# Patient Record
Sex: Female | Born: 1940
Health system: Southern US, Community
[De-identification: ages and names within clinical notes are randomized; demographics above are authoritative.]

## PROBLEM LIST (undated history)

## (undated) DIAGNOSIS — E785 Hyperlipidemia, unspecified: Secondary | ICD-10-CM

## (undated) DIAGNOSIS — M8580 Other specified disorders of bone density and structure, unspecified site: Secondary | ICD-10-CM

## (undated) DIAGNOSIS — L409 Psoriasis, unspecified: Secondary | ICD-10-CM

## (undated) DIAGNOSIS — Z862 Personal history of diseases of the blood and blood-forming organs and certain disorders involving the immune mechanism: Secondary | ICD-10-CM

## (undated) DIAGNOSIS — K589 Irritable bowel syndrome without diarrhea: Secondary | ICD-10-CM

## (undated) HISTORY — PX: ABDOMINAL HYSTERECTOMY: SHX81

## (undated) HISTORY — PX: BREAST SURGERY: SHX581

## (undated) HISTORY — DX: Personal history of diseases of the blood and blood-forming organs and certain disorders involving the immune mechanism: Z86.2

## (undated) HISTORY — DX: Other specified disorders of bone density and structure, unspecified site: M85.80

## (undated) HISTORY — DX: Psoriasis, unspecified: L40.9

## (undated) HISTORY — PX: TUBAL LIGATION: SHX77

## (undated) HISTORY — PX: APPENDECTOMY: SHX54

## (undated) HISTORY — PX: EYE SURGERY: SHX253

## (undated) HISTORY — DX: Irritable bowel syndrome, unspecified: K58.9

## (undated) HISTORY — DX: Hyperlipidemia, unspecified: E78.5

---

## 1998-05-08 ENCOUNTER — Ambulatory Visit (HOSPITAL_COMMUNITY): Admission: RE | Admit: 1998-05-08 | Discharge: 1998-05-08 | Payer: Self-pay | Admitting: Obstetrics and Gynecology

## 1998-07-13 ENCOUNTER — Ambulatory Visit (HOSPITAL_COMMUNITY): Admission: RE | Admit: 1998-07-13 | Discharge: 1998-07-13 | Payer: Self-pay | Admitting: Orthopedic Surgery

## 1998-07-17 ENCOUNTER — Ambulatory Visit (HOSPITAL_BASED_OUTPATIENT_CLINIC_OR_DEPARTMENT_OTHER): Admission: RE | Admit: 1998-07-17 | Discharge: 1998-07-17 | Payer: Self-pay | Admitting: Orthopedic Surgery

## 1998-07-23 ENCOUNTER — Other Ambulatory Visit: Admission: RE | Admit: 1998-07-23 | Discharge: 1998-07-23 | Payer: Self-pay | Admitting: Obstetrics and Gynecology

## 1999-07-01 ENCOUNTER — Encounter: Payer: Self-pay | Admitting: Obstetrics and Gynecology

## 1999-07-01 ENCOUNTER — Ambulatory Visit (HOSPITAL_COMMUNITY): Admission: RE | Admit: 1999-07-01 | Discharge: 1999-07-01 | Payer: Self-pay | Admitting: Obstetrics and Gynecology

## 1999-08-23 ENCOUNTER — Other Ambulatory Visit: Admission: RE | Admit: 1999-08-23 | Discharge: 1999-08-23 | Payer: Self-pay | Admitting: Obstetrics and Gynecology

## 2000-07-20 ENCOUNTER — Ambulatory Visit (HOSPITAL_COMMUNITY): Admission: RE | Admit: 2000-07-20 | Discharge: 2000-07-20 | Payer: Self-pay | Admitting: Obstetrics and Gynecology

## 2000-07-20 ENCOUNTER — Encounter: Payer: Self-pay | Admitting: Obstetrics and Gynecology

## 2001-07-22 ENCOUNTER — Encounter: Payer: Self-pay | Admitting: Obstetrics and Gynecology

## 2001-07-22 ENCOUNTER — Ambulatory Visit (HOSPITAL_COMMUNITY): Admission: RE | Admit: 2001-07-22 | Discharge: 2001-07-22 | Payer: Self-pay | Admitting: Obstetrics and Gynecology

## 2001-08-31 ENCOUNTER — Other Ambulatory Visit: Admission: RE | Admit: 2001-08-31 | Discharge: 2001-08-31 | Payer: Self-pay | Admitting: Obstetrics and Gynecology

## 2002-05-25 ENCOUNTER — Encounter: Payer: Self-pay | Admitting: Family Medicine

## 2002-05-25 ENCOUNTER — Encounter: Admission: RE | Admit: 2002-05-25 | Discharge: 2002-05-25 | Payer: Self-pay | Admitting: Family Medicine

## 2002-08-24 ENCOUNTER — Ambulatory Visit (HOSPITAL_COMMUNITY): Admission: RE | Admit: 2002-08-24 | Discharge: 2002-08-24 | Payer: Self-pay | Admitting: Obstetrics and Gynecology

## 2002-08-24 ENCOUNTER — Encounter: Payer: Self-pay | Admitting: Obstetrics and Gynecology

## 2003-08-28 ENCOUNTER — Ambulatory Visit (HOSPITAL_COMMUNITY): Admission: RE | Admit: 2003-08-28 | Discharge: 2003-08-28 | Payer: Self-pay | Admitting: Obstetrics and Gynecology

## 2004-09-03 ENCOUNTER — Ambulatory Visit (HOSPITAL_COMMUNITY): Admission: RE | Admit: 2004-09-03 | Discharge: 2004-09-03 | Payer: Self-pay | Admitting: Obstetrics and Gynecology

## 2005-09-08 ENCOUNTER — Ambulatory Visit (HOSPITAL_COMMUNITY): Admission: RE | Admit: 2005-09-08 | Discharge: 2005-09-08 | Payer: Self-pay | Admitting: Obstetrics and Gynecology

## 2006-04-28 ENCOUNTER — Ambulatory Visit (HOSPITAL_COMMUNITY): Admission: RE | Admit: 2006-04-28 | Discharge: 2006-04-29 | Payer: Self-pay | Admitting: Internal Medicine

## 2006-04-28 ENCOUNTER — Encounter (INDEPENDENT_AMBULATORY_CARE_PROVIDER_SITE_OTHER): Payer: Self-pay | Admitting: Specialist

## 2006-09-10 ENCOUNTER — Ambulatory Visit (HOSPITAL_COMMUNITY): Admission: RE | Admit: 2006-09-10 | Discharge: 2006-09-10 | Payer: Self-pay | Admitting: Obstetrics and Gynecology

## 2007-09-14 ENCOUNTER — Ambulatory Visit (HOSPITAL_COMMUNITY): Admission: RE | Admit: 2007-09-14 | Discharge: 2007-09-14 | Payer: Self-pay | Admitting: Obstetrics and Gynecology

## 2008-09-14 ENCOUNTER — Ambulatory Visit (HOSPITAL_COMMUNITY): Admission: RE | Admit: 2008-09-14 | Discharge: 2008-09-14 | Payer: Self-pay | Admitting: Obstetrics and Gynecology

## 2009-09-18 ENCOUNTER — Ambulatory Visit (HOSPITAL_COMMUNITY): Admission: RE | Admit: 2009-09-18 | Discharge: 2009-09-18 | Payer: Self-pay | Admitting: Obstetrics and Gynecology

## 2010-08-19 ENCOUNTER — Other Ambulatory Visit (HOSPITAL_COMMUNITY): Payer: Self-pay | Admitting: Internal Medicine

## 2010-08-19 DIAGNOSIS — Z1231 Encounter for screening mammogram for malignant neoplasm of breast: Secondary | ICD-10-CM

## 2010-09-06 NOTE — H&P (Signed)
NAME:  Madison Gonzalez, Madison Gonzalez NO.:  [id number]   MEDICAL RECORD NO.:  [id number]          PATIENT TYPE:  AMB   LOCATION:  SDC                           FACILITY:  [facility]   PHYSICIAN:  Malachi Pro. Ambrose Mantle, M.D. DATE OF BIRTH:  [birth year]   DATE OF ADMISSION:  04/28/2006  DATE OF DISCHARGE:                              HISTORY & PHYSICAL   HISTORY OF PRESENT ILLNESS:  This is a 70 year old white widowed female,  para 3-0-0-3, who is admitted for vaginal hysterectomy, A&P repair  because of fourth degree cystocele, fourth degree cervical prolapse and  smaller rectocele.  The patient's last menstrual period was 9.  She  has had a prolapse of the cervix and the bladder for quite a few years.  It has not bothered her significantly until fairly recently.  She did  undergo a urologic evaluation by Dr. Vernie Ammons who felt like she did not  need a sling procedure.  She is admitted now for vaginal hysterectomy  and A&P repair.  She has minimal stress urinary incontinence but does  feel the protrusion.   PAST MEDICAL HISTORY:  Reveals no known drug allergies.  No latex  allergy.   OPERATION:  1. Tubal ligation in 1970.  2. Appendectomy.  3. Cataract surgery x2.  4. D&C.   ILLNESSES:  None significant.   REVIEW OF SYSTEMS:  Occasional migraines.  No bowel problems.  No heart  problems or visual problems.   FAMILY HISTORY:  Father died at age 15 with rheumatic heart disease.  Mother died at age 38 with abdominal aneurysm.  Siblings:  A 71 year old  female was healthy, a 71 year old, 71 year old and 71 year old males are  healthy.   SOCIAL HISTORY:  The patient does not smoke, drinks occasionally.  She  works as a Academic librarian at [employer].   PHYSICAL EXAMINATION:  Well-developed, well-nourished white female in no  distress.  Blood pressure is 134/82, pulse is 80.  Weight is 147 pounds.  During her life, the patient has been over her present weight.  She has  weighed at least 177 pounds (actually the maximum weight I see for her  has been 187 pounds).  Head, eyes, ears, nose and throat reveal no  cranial abnormalities.  Extraocular movements intact.  Nose and pharynx  clear.  Neck supple without thyromegaly.  Heart normal size and sounds.  No murmurs.  Lungs are clear to auscultation.  Breasts are soft without  masses.  Abdomen is soft and nontender.  No masses are palpable.  Liver,  spleen and kidneys are not felt.  Pap smear on Sep 17, 2005 was within  normal limits.  The vulva and vagina are clean. The cervix sits at the  introitus.  The bladder protrudes through the introitus.  The uterus is  posterior, normal size.  Adnexa are clear.  Rectal exam is negative and  no pelvic masses felt.   ADMITTING IMPRESSION:  1. Fourth degree cystocele.  2. Fourth degree cervical prolapse.  3. Smaller rectocele.   The patient is admitted for vaginal hysterectomy and A&P repair.  She  understands the risks of surgery include but are  not limited to heart  attack, stroke, pulmonary embolus, wound disruption hemorrhage with need  for reoperation and/or transfusion, fistula formation, nerve injury and  intestinal obstruction.  She understands and agrees to proceed.  The  patient hopes that in the future she will possibly be sexually active.  An attempt will be made to preserve the vagina as much as possible.      Malachi Pro. Ambrose Mantle, M.D.  Electronically Signed     TFH/MEDQ  D:  04/27/2006  T:  04/27/2006  Job:  [id number]

## 2010-09-06 NOTE — Discharge Summary (Signed)
NAME:  Madison Gonzalez, Madison Gonzalez NO.:  [id number]   MEDICAL RECORD NO.:  [id number]          PATIENT TYPE:  OIB   LOCATION:  9303                          [facility]ACILITY:  [facility]   PHYSICIAN:  Malachi Pro. Ambrose Mantle, M.D. DATE OF BIRTH:  February 22, 1941   DATE OF ADMISSION:  04/28/2006  DATE OF DISCHARGE:  04/29/2006                               DISCHARGE SUMMARY   HOSPITAL COURSE:  The patient is a 70 year old white female admitted for  vaginal hysterectomy, A and P repair because of uterine prolapse,  cystocele and rectocele.  The patient underwent a vaginal hysterectomy  with A and P repair on April 28, 2006 by Dr. Ambrose Mantle under general  anesthesia.  Assistant was Dr. Lavina Hamman.  Blood loss about 250 mL.  Postoperatively the patient did fine.  On the first postop day her  temperature maximum had been 99.9 and she had no symptoms.  Her abdomen  was soft and nontender.  Pack was out, minimal stain was present.  She  was ready for discharge.   CLINICAL DATA:  Laboratory data showed path report:  Uterus with benign  cervix with squamous metaplasia,  hyperkeratosis and a benign  endocervical polyp, benign endometrial polyps, a small leiomyoma.   LABORATORY DATA:  Comprehensive metabolic profile was completely normal.  White count was 5700, hemoglobin 12.4, hematocrit 36.3, platelet count  202,000.  Follow-up hematocrit's 32.2 and 31.2.  Estimated GFR was  greater than 16 mL a minute.   FINAL DIAGNOSES:  Uterine prolapse, cystocele, rectocele, benign  endocervical and endometrial polyps, leiomyoma.   PROCEDURE:  Vaginal hysterectomy, A and P repair.   FINAL CONDITION:  Improved.   DISCHARGE INSTRUCTIONS:  Instructions include our regular discharge  instructions.  No vaginal entrance, no heavy lifting or strenuous  activity.  Call with any temperature elevation greater than 100.4  degrees.  Call with any unusual problems.   DISCHARGE MEDICATIONS:  Percocet 5/325, #24 tablets, one  every 4-6 hours  as needed for pain.   FOLLOWUP:  The patient is advised to return to the office in 1 week for  follow-up examination.   It should be noted that an EKG was done and it showed a normal EKG when  compared with a previous EKG of  July 13, 1998.  There had been no  significant change.      Malachi Pro. Ambrose Mantle, M.D.  Electronically Signed    TFH/MEDQ  D:  05/18/2006  T:  05/18/2006  Job:  [id number]

## 2010-09-06 NOTE — Op Note (Signed)
NAME:  Madison Gonzalez, Madison Gonzalez NO.:  [id number]   MEDICAL RECORD NO.:  [id number]          PATIENT TYPE:  AMB   LOCATION:  SDC                           [facility]ILITY:  [facility]   PHYSICIAN:  Malachi Pro. Ambrose Mantle, M.D. DATE OF BIRTH:  12/30/1940   DATE OF PROCEDURE:  04/28/2006  DATE OF DISCHARGE:                               OPERATIVE REPORT   PREOPERATIVE DIAGNOSES:  1. Fourth-degree cystocele.  2. Fourth-degree cervical prolapse.  3. Smaller rectocele.   POSTOPERATIVE DIAGNOSES:  1. Fourth-degree cystocele.  2. Fourth-degree cervical prolapse.  3. Smaller rectocele.   OPERATION:  1. Vaginal hysterectomy.  2. Anterior and posterior repair.   OPERATOR:  Malachi Pro. Ambrose Mantle, M.D.   ASSISTANT:  Zenaida Niece, M.D.   ANESTHESIA:  General anesthesia.   DESCRIPTION OF PROCEDURE:  The patient was brought to the operating room  and placed under satisfactory general anesthesia, placed in the  lithotomy position in the [place] stirrups.  Exam revealed a 4th-degree  cystocele, 4th-degree cervical prolapse, smaller rectocele.  The adnexa  were free of masses and the cul-de-sac felt smooth.  The vulva, vagina,  perineum and urethra were prepped with Betadine solution.  Foley  catheter was inserted to straight drain and the area was draped as a  sterile field.  A weighted speculum was placed posteriorly.  The cervix  was grasped with Lahey clamps.  A dilute solution of Neo-Synephrine was  injected around the cervicovaginal junction.  A circumferential incision  was then made around the cervicovaginal junction.  The anterior vaginal  mucosa was pushed forward.  I could not see the anterior peritoneum.  I  identified the posterior peritoneum and entered it with sharp  dissection.  The uterosacral ligaments were then clamped, cut and suture-  ligated and the cardinal ligaments were clamped, cut and suture-ligated  and both were held in a clamp.  Anterior dissection was continued to  push the bladder forward.  I then clamped, cut and suture-ligated  bilaterally and then was able to enter the anterior peritoneum and  retracted the bladder away.  I incorporated both peritoneal leaves  together in a clamp and suture-ligated them.  I inverted the uterus  through the incision in the cul-de-sac, clamped across both upper  pedicles, removed the uterus and doubly suture-ligated both upper  pedicles.  I did not see the ovaries.  I ran the posterior vaginal cuff,  incorporating it with the posterior pelvic peritoneum.  I then placed a  suture above the sutures holding the uterosacral ligaments, through the  uterosacral ligaments and the posterior pelvic peritoneum.  I then tied  that down.  I did a pursestring suture of #1 Vicryl, incorporating the  anterior peritoneum, the left upper pedicle, the left uterosacral  ligament, the posterior pelvic peritoneum, the right uterosacral  ligament and the right upper pedicle.  After confirming hemostasis, I  tied this down, closing the peritoneal cavity, cut it, tied the  uterosacral ligaments together.  Then I dissected the anterior vaginal  mucosa to about 1-2 cm from the urethra, developed the very large  cystocele, reduced the cystocele with multiple interrupted sutures  of 0  Vicryl, using a second layer in some areas, cut away a large amount of  redundant vaginal mucosa and reunited the vaginal mucosa in the midline  with interrupted figure-of-eight sutures of 0 Vicryl.  The posterior  fourchette was then cut across.  I dissected the posterior vaginal  mucosa to an area close to the cuff, developed the rectocele, did not  put any sutures in the rectocele itself, cut away redundant vaginal  mucosa and obliterated the rectocele by suturing the vaginal mucosa with  interrupted figure-of-eight sutures of 0 Vicryl.  Prior to doing the  rectocele, I exited the suture through the uterosacral ligaments, above  the uterosacral ligaments that  had been held, through the vaginal mucosa  at the cuff and then tied it down to try to suspend the vaginal vault.  After the posterior vaginal mucosa was reunited with interrupted figure-  of-eight sutures of 0 Vicryl, the perineum was reapproximated with  interrupted 3-0 Vicryl.  There was no bleeding.  A 2-inch Iodoform pack  was left in the vagina.  Prior to closure of the posterior vaginal  mucosa, I had done a rectal exam to confirm that there was no rectal  injury.  The urine was clear at the end of the procedure.  The procedure  was terminated and the patient was returned to Recovery in satisfactory  condition.  Blood loss was estimated at 250 mL.      Malachi Pro. Ambrose Mantle, M.D.  Electronically Signed     TFH/MEDQ  D:  04/28/2006  T:  04/28/2006  Job:  [id number]

## 2010-09-24 ENCOUNTER — Ambulatory Visit (HOSPITAL_COMMUNITY)
Admission: RE | Admit: 2010-09-24 | Discharge: 2010-09-24 | Disposition: A | Discharge status: Home / Self Care | Payer: Medicare Other | Source: Ambulatory Visit | Attending: Internal Medicine | Admitting: Internal Medicine

## 2010-09-24 DIAGNOSIS — Z1231 Encounter for screening mammogram for malignant neoplasm of breast: Secondary | ICD-10-CM

## 2010-09-26 ENCOUNTER — Other Ambulatory Visit: Payer: Self-pay | Admitting: Internal Medicine

## 2010-09-26 DIAGNOSIS — R928 Other abnormal and inconclusive findings on diagnostic imaging of breast: Secondary | ICD-10-CM

## 2010-10-02 ENCOUNTER — Ambulatory Visit
Admission: RE | Admit: 2010-10-02 | Discharge: 2010-10-02 | Disposition: A | Discharge status: Home / Self Care | Payer: Medicare Other | Source: Ambulatory Visit | Attending: Internal Medicine | Admitting: Internal Medicine

## 2010-10-02 DIAGNOSIS — R928 Other abnormal and inconclusive findings on diagnostic imaging of breast: Secondary | ICD-10-CM

## 2011-09-29 ENCOUNTER — Other Ambulatory Visit: Payer: Self-pay | Admitting: Internal Medicine

## 2011-09-29 DIAGNOSIS — Z1231 Encounter for screening mammogram for malignant neoplasm of breast: Secondary | ICD-10-CM

## 2011-10-13 ENCOUNTER — Ambulatory Visit
Admission: RE | Admit: 2011-10-13 | Discharge: 2011-10-13 | Disposition: A | Discharge status: Home / Self Care | Payer: Medicare Other | Source: Ambulatory Visit | Attending: Internal Medicine | Admitting: Internal Medicine

## 2011-10-13 DIAGNOSIS — Z1231 Encounter for screening mammogram for malignant neoplasm of breast: Secondary | ICD-10-CM

## 2011-10-21 ENCOUNTER — Other Ambulatory Visit: Payer: Self-pay | Admitting: Family Medicine

## 2011-10-21 DIAGNOSIS — M545 Low back pain, unspecified: Secondary | ICD-10-CM

## 2011-10-25 ENCOUNTER — Ambulatory Visit
Admission: RE | Admit: 2011-10-25 | Discharge: 2011-10-25 | Disposition: A | Discharge status: Home / Self Care | Payer: Medicare Other | Source: Ambulatory Visit | Attending: Family Medicine | Admitting: Family Medicine

## 2011-10-25 DIAGNOSIS — M545 Low back pain, unspecified: Secondary | ICD-10-CM

## 2012-02-20 LAB — HM DEXA SCAN

## 2012-08-19 LAB — HM DIABETES EYE EXAM

## 2012-09-16 ENCOUNTER — Other Ambulatory Visit: Payer: Self-pay

## 2012-09-16 DIAGNOSIS — Z1231 Encounter for screening mammogram for malignant neoplasm of breast: Secondary | ICD-10-CM

## 2012-10-19 ENCOUNTER — Ambulatory Visit
Admission: RE | Admit: 2012-10-19 | Discharge: 2012-10-19 | Disposition: A | Discharge status: Home / Self Care | Payer: Medicare Other | Source: Ambulatory Visit

## 2012-10-19 DIAGNOSIS — Z1231 Encounter for screening mammogram for malignant neoplasm of breast: Secondary | ICD-10-CM

## 2013-03-11 ENCOUNTER — Encounter: Payer: Self-pay | Admitting: Internal Medicine

## 2013-03-11 DIAGNOSIS — Z862 Personal history of diseases of the blood and blood-forming organs and certain disorders involving the immune mechanism: Secondary | ICD-10-CM | POA: Insufficient documentation

## 2013-03-11 DIAGNOSIS — K589 Irritable bowel syndrome without diarrhea: Secondary | ICD-10-CM | POA: Insufficient documentation

## 2013-03-11 DIAGNOSIS — M8580 Other specified disorders of bone density and structure, unspecified site: Secondary | ICD-10-CM | POA: Insufficient documentation

## 2013-03-11 DIAGNOSIS — L409 Psoriasis, unspecified: Secondary | ICD-10-CM | POA: Insufficient documentation

## 2013-03-11 DIAGNOSIS — M810 Age-related osteoporosis without current pathological fracture: Secondary | ICD-10-CM | POA: Insufficient documentation

## 2013-03-14 ENCOUNTER — Encounter: Payer: Self-pay | Admitting: Emergency Medicine

## 2013-03-14 ENCOUNTER — Ambulatory Visit: Payer: Self-pay | Admitting: Emergency Medicine

## 2013-03-14 VITALS — BP 130/78 | HR 56 | Temp 98.0°F | Resp 20 | Ht 65.25 in | Wt 135.0 lb

## 2013-03-14 DIAGNOSIS — R5381 Other malaise: Secondary | ICD-10-CM

## 2013-03-14 DIAGNOSIS — R7309 Other abnormal glucose: Secondary | ICD-10-CM

## 2013-03-14 DIAGNOSIS — E782 Mixed hyperlipidemia: Secondary | ICD-10-CM

## 2013-03-14 DIAGNOSIS — E559 Vitamin D deficiency, unspecified: Secondary | ICD-10-CM

## 2013-03-14 DIAGNOSIS — Z0000 Encounter for general adult medical examination without abnormal findings: Secondary | ICD-10-CM

## 2013-03-14 DIAGNOSIS — R030 Elevated blood-pressure reading, without diagnosis of hypertension: Secondary | ICD-10-CM

## 2013-03-14 LAB — HEPATIC FUNCTION PANEL
ALT: 13 U/L (ref 0–35)
AST: 18 U/L (ref 0–37)
Alkaline Phosphatase: 87 U/L (ref 39–117)
Bilirubin, Direct: 0.1 mg/dL (ref 0.0–0.3)
Total Protein: 6.7 g/dL (ref 6.0–8.3)

## 2013-03-14 LAB — HM COLONOSCOPY

## 2013-03-14 LAB — CBC WITH DIFFERENTIAL/PLATELET
Basophils Relative: 1 % (ref 0–1)
Eosinophils Relative: 3 % (ref 0–5)
HCT: 37.2 % (ref 36.0–46.0)
Hemoglobin: 12.4 g/dL (ref 12.0–15.0)
Lymphs Abs: 1.5 10*3/uL (ref 0.7–4.0)
MCH: 29.9 pg (ref 26.0–34.0)
Monocytes Absolute: 0.4 10*3/uL (ref 0.1–1.0)
Neutro Abs: 2.6 10*3/uL (ref 1.7–7.7)
WBC: 4.7 10*3/uL (ref 4.0–10.5)

## 2013-03-14 LAB — BASIC METABOLIC PANEL WITH GFR
CO2: 30 mEq/L (ref 19–32)
Chloride: 103 mEq/L (ref 96–112)
GFR, Est Non African American: 87 mL/min
Glucose, Bld: 92 mg/dL (ref 70–99)
Potassium: 4.1 mEq/L (ref 3.5–5.3)

## 2013-03-14 LAB — LIPID PANEL
Cholesterol: 213 mg/dL — ABNORMAL HIGH (ref 0–200)
LDL Cholesterol: 142 mg/dL — ABNORMAL HIGH (ref 0–99)
Triglycerides: 48 mg/dL (ref <150)

## 2013-03-14 LAB — HM MAMMOGRAPHY

## 2013-03-14 LAB — HM PAP SMEAR

## 2013-03-14 LAB — HEMOGLOBIN A1C: Hgb A1c MFr Bld: 5.8 % — ABNORMAL HIGH (ref <5.7)

## 2013-03-14 NOTE — Patient Instructions (Signed)
Fat and Cholesterol Control Diet  Fat and cholesterol levels in your blood and organs are influenced by your diet. High levels of fat and cholesterol may lead to diseases of the heart, small and large blood vessels, gallbladder, liver, and pancreas.  CONTROLLING FAT AND CHOLESTEROL WITH DIET  Although exercise and lifestyle factors are important, your diet is key. That is because certain foods are known to raise cholesterol and others to lower it. The goal is to balance foods for their effect on cholesterol and more importantly, to replace saturated and trans fat with other types of fat, such as monounsaturated fat, polyunsaturated fat, and omega-3 fatty acids.  On average, a person should consume no more than 15 to 17 g of saturated fat daily. Saturated and trans fats are considered "bad" fats, and they will raise LDL cholesterol. Saturated fats are primarily found in animal products such as meats, butter, and cream. However, that does not mean you need to give up all your favorite foods. Today, there are good tasting, low-fat, low-cholesterol substitutes for most of the things you like to eat. Choose low-fat or nonfat alternatives. Choose round or loin cuts of red meat. These types of cuts are lowest in fat and cholesterol. Chicken (without the skin), fish, veal, and ground turkey breast are great choices. Eliminate fatty meats, such as hot dogs and salami. Even shellfish have little or no saturated fat. Have a 3 oz (85 g) portion when you eat lean meat, poultry, or fish.  Trans fats are also called "partially hydrogenated oils." They are oils that have been scientifically manipulated so that they are solid at room temperature resulting in a longer shelf life and improved taste and texture of foods in which they are added. Trans fats are found in stick margarine, some tub margarines, cookies, crackers, and baked goods.   When baking and cooking, oils are a great substitute for butter. The monounsaturated oils are  especially beneficial since it is believed they lower LDL and raise HDL. The oils you should avoid entirely are saturated tropical oils, such as coconut and palm.   Remember to eat a lot from food groups that are naturally free of saturated and trans fat, including fish, fruit, vegetables, beans, grains (barley, rice, couscous, bulgur wheat), and pasta (without cream sauces).   IDENTIFYING FOODS THAT LOWER FAT AND CHOLESTEROL   Soluble fiber may lower your cholesterol. This type of fiber is found in fruits such as apples, vegetables such as broccoli, potatoes, and carrots, legumes such as beans, peas, and lentils, and grains such as barley. Foods fortified with plant sterols (phytosterol) may also lower cholesterol. You should eat at least 2 g per day of these foods for a cholesterol lowering effect.   Read package labels to identify low-saturated fats, trans fat free, and low-fat foods at the supermarket. Select cheeses that have only 2 to 3 g saturated fat per ounce. Use a heart-healthy tub margarine that is free of trans fats or partially hydrogenated oil. When buying baked goods (cookies, crackers), avoid partially hydrogenated oils. Breads and muffins should be made from whole grains (whole-wheat or whole oat flour, instead of "flour" or "enriched flour"). Buy non-creamy canned soups with reduced salt and no added fats.   FOOD PREPARATION TECHNIQUES   Never deep-fry. If you must fry, either stir-fry, which uses very little fat, or use non-stick cooking sprays. When possible, broil, bake, or roast meats, and steam vegetables. Instead of putting butter or margarine on vegetables, use lemon   and herbs, applesauce, and cinnamon (for squash and sweet potatoes). Use nonfat yogurt, salsa, and low-fat dressings for salads.   LOW-SATURATED FAT / LOW-FAT FOOD SUBSTITUTES  Meats / Saturated Fat (g)  · Avoid: Steak, marbled (3 oz/85 g) / 11 g  · Choose: Steak, lean (3 oz/85 g) / 4 g  · Avoid: Hamburger (3 oz/85 g) / 7  g  · Choose: Hamburger, lean (3 oz/85 g) / 5 g  · Avoid: Ham (3 oz/85 g) / 6 g  · Choose: Ham, lean cut (3 oz/85 g) / 2.4 g  · Avoid: Chicken, with skin, dark meat (3 oz/85 g) / 4 g  · Choose: Chicken, skin removed, dark meat (3 oz/85 g) / 2 g  · Avoid: Chicken, with skin, light meat (3 oz/85 g) / 2.5 g  · Choose: Chicken, skin removed, light meat (3 oz/85 g) / 1 g  Dairy / Saturated Fat (g)  · Avoid: Whole milk (1 cup) / 5 g  · Choose: Low-fat milk, 2% (1 cup) / 3 g  · Choose: Low-fat milk, 1% (1 cup) / 1.5 g  · Choose: Skim milk (1 cup) / 0.3 g  · Avoid: Hard cheese (1 oz/28 g) / 6 g  · Choose: Skim milk cheese (1 oz/28 g) / 2 to 3 g  · Avoid: Cottage cheese, 4% fat (1 cup) / 6.5 g  · Choose: Low-fat cottage cheese, 1% fat (1 cup) / 1.5 g  · Avoid: Ice cream (1 cup) / 9 g  · Choose: Sherbet (1 cup) / 2.5 g  · Choose: Nonfat frozen yogurt (1 cup) / 0.3 g  · Choose: Frozen fruit bar / trace  · Avoid: Whipped cream (1 tbs) / 3.5 g  · Choose: Nondairy whipped topping (1 tbs) / 1 g  Condiments / Saturated Fat (g)  · Avoid: Mayonnaise (1 tbs) / 2 g  · Choose: Low-fat mayonnaise (1 tbs) / 1 g  · Avoid: Butter (1 tbs) / 7 g  · Choose: Extra light margarine (1 tbs) / 1 g  · Avoid: Coconut oil (1 tbs) / 11.8 g  · Choose: Olive oil (1 tbs) / 1.8 g  · Choose: Corn oil (1 tbs) / 1.7 g  · Choose: Safflower oil (1 tbs) / 1.2 g  · Choose: Sunflower oil (1 tbs) / 1.4 g  · Choose: Soybean oil (1 tbs) / 2.4 g  · Choose: Canola oil (1 tbs) / 1 g  Document Released: 04/07/2005 Document Revised: 08/02/2012 Document Reviewed: 09/26/2010  [employer]® Patient Information ©2014 [employer].

## 2013-03-15 ENCOUNTER — Telehealth: Payer: Self-pay | Admitting: *Deleted

## 2013-03-15 LAB — MICROALBUMIN / CREATININE URINE RATIO
Creatinine, Urine: 142.7 mg/dL
Microalb Creat Ratio: 4.3 mg/g (ref 0.0–30.0)

## 2013-03-15 LAB — URINALYSIS, ROUTINE W REFLEX MICROSCOPIC
Glucose, UA: NEGATIVE mg/dL
Hgb urine dipstick: NEGATIVE
Ketones, ur: NEGATIVE mg/dL
Leukocytes, UA: NEGATIVE
Urobilinogen, UA: 0.2 mg/dL (ref 0.0–1.0)
pH: 5.5 (ref 5.0–8.0)

## 2013-03-15 NOTE — Telephone Encounter (Signed)
Spoke with patient about lab results and instructions. 

## 2013-03-15 NOTE — Telephone Encounter (Signed)
Message copied by Nicholaus Corolla A on Tue Mar 15, 2013  2:48 PM ------      Message from: [place], [place] R      Created: Tue Mar 15, 2013  2:00 PM       Cholesterol/ A1c elevated need better diet add flax seed, increase H2o and exercise. Needs 3 month f/u with [facility] if has not scheduled ------

## 2013-03-19 NOTE — Progress Notes (Signed)
Subjective:    Patient ID: Madison Gonzalez, female    DOB: [birth year], 72 y.o.   MRN: [id number]  HPI Comments: 72 yo female CPE and for 3 month F/U for elevated BP, Cholesterol, Pre-Dm, D. Deficient. She has been doing well overall. She stopped all vitamins because she felt she did not need them with healthy diet. She has occasional fatigue without triggers. She is keeping active. She does not check her BP regularly.    Current Outpatient Prescriptions on File Prior to Visit  Medication Sig Dispense Refill  . calcium carbonate (OS-CAL) 600 MG TABS tablet Take 600 mg by mouth daily.      . Calcium Polycarbophil (FIBER) 625 MG TABS Take by mouth daily.      . Cholecalciferol (VITAMIN D3) 5000 UNITS TABS Take by mouth daily.      Marland Kitchen glucosamine-chondroitin 500-400 MG tablet Take 1 tablet by mouth 3 (three) times daily.      . Multiple Vitamin (MULTIVITAMIN) tablet Take 1 tablet by mouth daily.       No current facility-administered medications on file prior to visit.   ALLERGIES Fosamax  Past Medical History  Diagnosis Date  . Hypertension     Labile  . Hyperlipidemia   . IBS (irritable bowel syndrome)   . Psoriasis   . Osteopenia   . History of ITP     Past Surgical History  Procedure Laterality Date  . Breast surgery Right     Biospy, benign  . Abdominal hysterectomy    . Eye surgery Bilateral     Cataracts  . Eye surgery      Lense implants    History  Substance Use Topics  . Smoking status: Never Smoker   . Smokeless tobacco: Not on file  . Alcohol Use: Not on file   Family History  Problem Relation Age of Onset  . AAA (abdominal aortic aneurysm) Mother   . Hypertension Father   . Heart disease Father     Review of Systems  Constitutional: Positive for fatigue.  Eyes:       [name] 08/19/2012 wnl  Gastrointestinal:       [name] prn. Refuses Colonoscopy  Genitourinary:       REFUSES GYN EVAL/ MAMMO  Skin:       [name] 2/14 WNL  All other  systems reviewed and are negative.    BP 130/78  Pulse 56  Temp(Src) 98 F (36.7 C) (Temporal)  Resp 20  Ht 5' 5.25" (1.657 m)  Wt 135 lb (61.236 kg)  BMI 22.30 kg/m2     Objective:   Physical Exam  Nursing note and vitals reviewed. Constitutional: She is oriented to person, place, and time. She appears well-developed and well-nourished.  HENT:  Head: Normocephalic and atraumatic.  Right Ear: External ear normal.  Left Ear: External ear normal.  Nose: Nose normal.  Mouth/Throat: Oropharynx is clear and moist. No oropharyngeal exudate.  Eyes: Conjunctivae and EOM are normal. Pupils are equal, round, and reactive to light. No scleral icterus.  Neck: Normal range of motion. Neck supple. No JVD present. No tracheal deviation present. No thyromegaly present.  Cardiovascular: Normal rate, regular rhythm, normal heart sounds and intact distal pulses.   Pulmonary/Chest: Effort normal and breath sounds normal.  Abdominal: Soft. Bowel sounds are normal. She exhibits no distension and no mass. There is no tenderness. There is no rebound and no guarding.  Genitourinary:  Pt declines Exams  Musculoskeletal: Normal range of motion.  Lymphadenopathy:    She has no cervical adenopathy.  Neurological: She is alert and oriented to person, place, and time. She has normal reflexes. No cranial nerve deficit. Coordination normal.  Skin: Skin is warm and dry. No rash noted. No erythema. No pallor.  Psychiatric: She has a normal mood and affect. Her behavior is normal. Judgment and thought content normal.          Assessment & Plan:  1. CPE and 3 month F/U for elevated BP, Cholesterol, Pre-Dm, D. Deficient check labs 2. Occasional Fatigue- check labs 2. Osteoporosis- New DX 2013 advise to restart Calcium, Vit D, magnesium AD

## 2013-03-21 LAB — TB SKIN TEST: TB Skin Test: NEGATIVE

## 2013-06-29 ENCOUNTER — Ambulatory Visit (INDEPENDENT_AMBULATORY_CARE_PROVIDER_SITE_OTHER): Payer: Medicare HMO | Admitting: Internal Medicine

## 2013-06-29 ENCOUNTER — Encounter: Payer: Self-pay | Admitting: Internal Medicine

## 2013-06-29 VITALS — BP 118/82 | HR 60 | Temp 97.9°F | Resp 16 | Ht 65.25 in | Wt 138.8 lb

## 2013-06-29 DIAGNOSIS — R7303 Prediabetes: Secondary | ICD-10-CM | POA: Insufficient documentation

## 2013-06-29 DIAGNOSIS — R030 Elevated blood-pressure reading, without diagnosis of hypertension: Secondary | ICD-10-CM

## 2013-06-29 DIAGNOSIS — R7309 Other abnormal glucose: Secondary | ICD-10-CM

## 2013-06-29 DIAGNOSIS — E782 Mixed hyperlipidemia: Secondary | ICD-10-CM

## 2013-06-29 DIAGNOSIS — Z79899 Other long term (current) drug therapy: Secondary | ICD-10-CM

## 2013-06-29 DIAGNOSIS — E559 Vitamin D deficiency, unspecified: Secondary | ICD-10-CM

## 2013-06-29 LAB — BASIC METABOLIC PANEL WITH GFR
BUN: 19 mg/dL (ref 6–23)
CHLORIDE: 106 meq/L (ref 96–112)
CO2: 30 mEq/L (ref 19–32)
Calcium: 9 mg/dL (ref 8.4–10.5)
Creat: 0.63 mg/dL (ref 0.50–1.10)
GFR, Est Non African American: 89 mL/min
Glucose, Bld: 93 mg/dL (ref 70–99)
POTASSIUM: 4.3 meq/L (ref 3.5–5.3)
SODIUM: 141 meq/L (ref 135–145)

## 2013-06-29 LAB — LIPID PANEL
CHOL/HDL RATIO: 3.7 ratio
CHOLESTEROL: 206 mg/dL — AB (ref 0–200)
HDL: 55 mg/dL (ref >39)
LDL CALC: 140 mg/dL — AB (ref 0–99)
Triglycerides: 53 mg/dL (ref <150)
VLDL: 11 mg/dL (ref 0–40)

## 2013-06-29 LAB — HEPATIC FUNCTION PANEL
ALK PHOS: 83 U/L (ref 39–117)
ALT: 14 U/L (ref 0–35)
AST: 17 U/L (ref 0–37)
Albumin: 4.3 g/dL (ref 3.5–5.2)
BILIRUBIN DIRECT: 0.1 mg/dL (ref 0.0–0.3)
BILIRUBIN INDIRECT: 0.6 mg/dL (ref 0.2–1.2)
Total Bilirubin: 0.7 mg/dL (ref 0.2–1.2)
Total Protein: 6.5 g/dL (ref 6.0–8.3)

## 2013-06-29 LAB — MAGNESIUM: Magnesium: 2 mg/dL (ref 1.5–2.5)

## 2013-06-29 LAB — CBC WITH DIFFERENTIAL/PLATELET
BASOS PCT: 1 % (ref 0–1)
Basophils Absolute: 0 10*3/uL (ref 0.0–0.1)
Eosinophils Absolute: 0.1 10*3/uL (ref 0.0–0.7)
Eosinophils Relative: 3 % (ref 0–5)
HCT: 38.2 % (ref 36.0–46.0)
Hemoglobin: 12.7 g/dL (ref 12.0–15.0)
LYMPHS PCT: 31 % (ref 12–46)
Lymphs Abs: 1.5 10*3/uL (ref 0.7–4.0)
MCH: 30 pg (ref 26.0–34.0)
MCHC: 33.2 g/dL (ref 30.0–36.0)
MCV: 90.1 fL (ref 78.0–100.0)
MONO ABS: 0.3 10*3/uL (ref 0.1–1.0)
MONOS PCT: 7 % (ref 3–12)
NEUTROS ABS: 2.8 10*3/uL (ref 1.7–7.7)
NEUTROS PCT: 58 % (ref 43–77)
Platelets: 194 10*3/uL (ref 150–400)
RBC: 4.24 MIL/uL (ref 3.87–5.11)
RDW: 14.3 % (ref 11.5–15.5)
WBC: 4.8 10*3/uL (ref 4.0–10.5)

## 2013-06-29 LAB — HEMOGLOBIN A1C
Hgb A1c MFr Bld: 5.7 % — ABNORMAL HIGH (ref <5.7)
Mean Plasma Glucose: 117 mg/dL — ABNORMAL HIGH (ref <117)

## 2013-06-29 LAB — TSH: TSH: 0.928 u[IU]/mL (ref 0.350–4.500)

## 2013-06-29 NOTE — Progress Notes (Signed)
Patient ID: Madison Gonzalez, female   DOB: 1941-02-01, 73 y.o.   MRN: [id number]    This very nice 73 y.o. [facility] presents for 3 month follow up with Hypertension, Hyperlipidemia, Pre-Diabetes and Vitamin D Deficiency.    Patient has Hx/o elevated BP and is being monitored expectantly. BP has been controlled at home. Today's BP: 118/82 mmHg . Patient denies any cardiac type chest pain, palpitations, dyspnea/orthopnea/PND, dizziness, claudication, or dependent edema.   Hyperlipidemia is controlled with diet & meds. Last Cholesterol was  , Triglycerides were    , HDL    and LDL    . Patient denies myalgias or other med SE's.  Lab Results  Component Value Date   CHOL 213* 03/14/2013   HDL 61 03/14/2013   LDLCALC 142* 03/14/2013   TRIG 48 03/14/2013   CHOLHDL 3.5 03/14/2013    Also, the patient is screened for PreDiabetes/insulin resistance with last A1c of 5.8% in Nov 2014. Patient denies any symptoms of reactive hypoglycemia, diabetic polys, paresthesias or visual blurring.   Further, Patient has history of Vitamin D Deficiency with last vitamin D of 44 in Nov 2014. Patient supplements vitamin D without any suspected side-effects.    Medication List       calcium carbonate 600 MG Tabs tablet  Commonly known as:  OS-CAL  Take 600 mg by mouth daily.     MAGNESIUM PO  Take by mouth daily.     multivitamin tablet  Take 1 tablet by mouth daily.     Vitamin D3 5000 UNITS Tabs  Take by mouth daily.        Allergies  Allergen Reactions  . Fosamax [Alendronate Sodium]     Myalgias   PMHx:   Past Medical History  Diagnosis Date  . Hypertension     Labile  . Hyperlipidemia   . IBS (irritable bowel syndrome)   . Psoriasis   . Osteopenia   . History of ITP    FHx:    Reviewed / unchanged  SHx:    Reviewed / unchanged  Systems Review: Constitutional: Denies fever, chills, wt changes, headaches, insomnia, fatigue, night sweats, change in appetite. Eyes: Denies redness,  blurred vision, diplopia, discharge, itchy, watery eyes.  ENT: Denies discharge, congestion, post nasal drip, epistaxis, sore throat, earache, hearing loss, dental pain, tinnitus, vertigo, sinus pain, snoring.  CV: Denies chest pain, palpitations, irregular heartbeat, syncope, dyspnea, diaphoresis, orthopnea, PND, claudication, edema. Respiratory: denies cough, dyspnea, DOE, pleurisy, hoarseness, laryngitis, wheezing.  Gastrointestinal: Denies dysphagia, odynophagia, heartburn, reflux, water brash, abdominal pain or cramps, nausea, vomiting, bloating, diarrhea, constipation, hematemesis, melena, hematochezia,  or hemorrhoids. Genitourinary: Denies dysuria, frequency, urgency, nocturia, hesitancy, discharge, hematuria, flank pain. Musculoskeletal: Denies arthralgias, myalgias, stiffness, jt. swelling, pain, limp, strain/sprain.  Skin: Denies pruritus, rash, hives, warts, acne, eczema, change in skin lesion(s). Neuro: No weakness, tremor, incoordination, spasms, paresthesia, or pain. Psychiatric: Denies confusion, memory loss, or sensory loss. Endo: Denies change in weight, skin, hair change.  Heme/Lymph: No excessive bleeding, bruising, orenlarged lymph nodes.  BP 118/82  Pulse 60  Temp(Src) 97.9 F (36.6 C) (Temporal)  Resp 16  Ht 5' 5.25" (1.657 m)  Wt 138 lb 12.8 oz (62.959 kg)  BMI 22.93 kg/m2   Estimated body mass index is 22.93 kg/(m^2) as calculated from the following:   Height as of this encounter: 5' 5.25" (1.657 m).   Weight as of this encounter: 138 lb 12.8 oz (62.959 kg).  On Exam: Appears well nourished - in  no distress. Eyes: PERRLA, EOMs, conjunctiva no swelling or erythema. Sinuses: No frontal/maxillary tenderness ENT/Mouth: EAC's clear, TM's nl w/o erythema, bulging. Nares clear w/o erythema, swelling, exudates. Oropharynx clear without erythema or exudates. Oral hygiene is good. Tongue normal, non obstructing. Hearing intact.  Neck: Supple. Thyroid nl. Car 2+/2+  without bruits, nodes or JVD. Chest: Respirations nl with BS clear & equal w/o rales, rhonchi, wheezing or stridor.  Cor: Heart sounds normal w/ regular rate and rhythm without sig. murmurs, gallops, clicks, or rubs. Peripheral pulses normal and equal  without edema.  Abdomen: Soft & bowel sounds normal. Non-tender w/o guarding, rebound, hernias, masses, or organomegaly.  Lymphatics: Unremarkable.  Musculoskeletal: Full ROM all peripheral extremities, joint stability, 5/5 strength, and normal gait.  Skin: Warm, dry without exposed rashes, lesions, ecchymosis apparent.  Neuro: Cranial nerves intact, reflexes equal bilaterally. Sensory-motor testing grossly intact. Tendon reflexes grossly intact.  Pysch: Alert & oriented x 3. Insight and judgement nl & appropriate. No ideations.  Assessment and Plan:  1. Elevated BP w/o Dx HTN - Continue monitor blood pressure at home. Continue diet/meds same.  2. Hyperlipidemia - Continue diet/meds, exercise,& lifestyle modifications. Continue monitor periodic cholesterol/liver & renal functions   3. Pre-diabetes Screening - Continue diet, exercise, lifestyle modifications. Monitor appropriate labs.  4. Vitamin D Deficiency - Continue supplementation.  Recommended regular exercise, BP monitoring, weight control, and discussed med and SE's. Recommended labs to assess and monitor clinical status. Further disposition pending results of labs.

## 2013-06-30 LAB — INSULIN, FASTING: Insulin fasting, serum: 3 u[IU]/mL (ref 3–28)

## 2013-06-30 LAB — VITAMIN D 25 HYDROXY (VIT D DEFICIENCY, FRACTURES): Vit D, 25-Hydroxy: 56 ng/mL (ref 30–89)

## 2013-07-03 ENCOUNTER — Other Ambulatory Visit: Payer: Self-pay | Admitting: Internal Medicine

## 2013-07-03 MED ORDER — ATORVASTATIN CALCIUM 80 MG PO TABS: ORAL_TABLET | ORAL | Status: DC

## 2013-09-13 ENCOUNTER — Other Ambulatory Visit: Payer: Self-pay

## 2013-09-13 DIAGNOSIS — Z1231 Encounter for screening mammogram for malignant neoplasm of breast: Secondary | ICD-10-CM

## 2013-09-30 ENCOUNTER — Encounter: Payer: Self-pay | Admitting: Emergency Medicine

## 2013-09-30 ENCOUNTER — Ambulatory Visit (INDEPENDENT_AMBULATORY_CARE_PROVIDER_SITE_OTHER): Payer: Medicare HMO | Admitting: Emergency Medicine

## 2013-09-30 VITALS — BP 124/78 | HR 60 | Temp 98.1°F | Resp 16 | Wt 141.6 lb

## 2013-09-30 DIAGNOSIS — E782 Mixed hyperlipidemia: Secondary | ICD-10-CM

## 2013-09-30 DIAGNOSIS — L409 Psoriasis, unspecified: Secondary | ICD-10-CM

## 2013-09-30 DIAGNOSIS — R7309 Other abnormal glucose: Secondary | ICD-10-CM

## 2013-09-30 DIAGNOSIS — L408 Other psoriasis: Secondary | ICD-10-CM

## 2013-09-30 LAB — HEPATIC FUNCTION PANEL
ALT: 16 U/L (ref 0–35)
AST: 18 U/L (ref 0–37)
Albumin: 4 g/dL (ref 3.5–5.2)
Alkaline Phosphatase: 71 U/L (ref 39–117)
Bilirubin, Direct: 0.1 mg/dL (ref 0.0–0.3)
Indirect Bilirubin: 0.7 mg/dL (ref 0.2–1.2)
Total Bilirubin: 0.8 mg/dL (ref 0.2–1.2)
Total Protein: 6.3 g/dL (ref 6.0–8.3)

## 2013-09-30 LAB — LIPID PANEL
CHOLESTEROL: 206 mg/dL — AB (ref 0–200)
HDL: 54 mg/dL (ref >39)
LDL Cholesterol: 143 mg/dL — ABNORMAL HIGH (ref 0–99)
Total CHOL/HDL Ratio: 3.8 Ratio
Triglycerides: 45 mg/dL (ref <150)
VLDL: 9 mg/dL (ref 0–40)

## 2013-09-30 LAB — CBC WITH DIFFERENTIAL/PLATELET
Basophils Absolute: 0.1 10*3/uL (ref 0.0–0.1)
Basophils Relative: 1 % (ref 0–1)
EOS PCT: 3 % (ref 0–5)
Eosinophils Absolute: 0.2 10*3/uL (ref 0.0–0.7)
HEMATOCRIT: 36.3 % (ref 36.0–46.0)
HEMOGLOBIN: 12.4 g/dL (ref 12.0–15.0)
LYMPHS ABS: 1.4 10*3/uL (ref 0.7–4.0)
Lymphocytes Relative: 27 % (ref 12–46)
MCH: 30.8 pg (ref 26.0–34.0)
MCHC: 34.2 g/dL (ref 30.0–36.0)
MCV: 90.1 fL (ref 78.0–100.0)
MONOS PCT: 6 % (ref 3–12)
Monocytes Absolute: 0.3 10*3/uL (ref 0.1–1.0)
Neutro Abs: 3.3 10*3/uL (ref 1.7–7.7)
Neutrophils Relative %: 63 % (ref 43–77)
Platelets: 190 10*3/uL (ref 150–400)
RBC: 4.03 MIL/uL (ref 3.87–5.11)
RDW: 13.4 % (ref 11.5–15.5)
WBC: 5.3 10*3/uL (ref 4.0–10.5)

## 2013-09-30 LAB — BASIC METABOLIC PANEL WITH GFR
BUN: 16 mg/dL (ref 6–23)
CHLORIDE: 102 meq/L (ref 96–112)
CO2: 30 meq/L (ref 19–32)
CREATININE: 0.7 mg/dL (ref 0.50–1.10)
Calcium: 9.2 mg/dL (ref 8.4–10.5)
GFR, EST NON AFRICAN AMERICAN: 86 mL/min
GFR, Est African American: >89 mL/min
Glucose, Bld: 88 mg/dL (ref 70–99)
Potassium: 4.4 mEq/L (ref 3.5–5.3)
Sodium: 142 mEq/L (ref 135–145)

## 2013-09-30 LAB — HEMOGLOBIN A1C
Hgb A1c MFr Bld: 5.8 % — ABNORMAL HIGH (ref <5.7)
Mean Plasma Glucose: 120 mg/dL — ABNORMAL HIGH (ref <117)

## 2013-09-30 MED ORDER — CALCIPOTRIENE 0.005 % EX OINT: TOPICAL_OINTMENT | Freq: Two times a day (BID) | CUTANEOUS | Status: DC

## 2013-09-30 NOTE — Progress Notes (Signed)
Subjective:    Patient ID: Madison Gonzalez, female    DOB: [birth year], 74 y.o.   MRN: [id number]  HPI Comments: 73 yo WF presents for 3 month F/U for LABILE HTN, Cholesterol, Pre-Dm, D. deficient She is taking Statin 1/2 MWF. SHe denies any SE with statin. SHe is exercising with keeping busy. She eats healthy for the most part. She notes mild Psoriasis flare but trying to manage with old RX cream given by [facility] and needs refill.  WBC             4.8   06/29/2013 HGB            12.7   06/29/2013 HCT            38.2   06/29/2013 PLT             194   06/29/2013 GLUCOSE          93   06/29/2013 CHOL            206   06/29/2013 TRIG             53   06/29/2013 HDL              55   06/29/2013 LDLCALC         140   06/29/2013 ALT              14   06/29/2013 AST              17   06/29/2013 NA              141   06/29/2013 K               4.3   06/29/2013 CL              106   06/29/2013 CREATININE     0.63   06/29/2013 BUN              19   06/29/2013 CO2              30   06/29/2013 TSH           0.928   06/29/2013 HGBA1C          5.7   06/29/2013 MICROALBUR     0.61   03/14/2013     Medication List       This list is accurate as of: 09/30/13  9:36 AM.  Always use your most recent med list.               atorvastatin 80 MG tablet  Commonly known as:  LIPITOR  Take 1/2 to 1 tablet daily or as directed for cholesterol     calcium carbonate 600 MG Tabs tablet  Commonly known as:  OS-CAL  Take 600 mg by mouth daily.     MAGNESIUM PO  Take by mouth daily.     multivitamin tablet  Take 1 tablet by mouth daily.     Vitamin D3 5000 UNITS Tabs  Take by mouth daily.       Allergies  Allergen Reactions  . Fosamax [Alendronate Sodium]     Myalgias   Past Medical History  Diagnosis Date  . Hypertension     Labile  . Hyperlipidemia   . IBS (irritable bowel syndrome)   . Psoriasis   . Osteopenia   . History of ITP       Review of Systems  Skin: Positive for rash.  All  other systems reviewed and are negative.  BP 124/78  Pulse 60  Temp(Src) 98.1 F (36.7 C)  Resp 16  Wt 141 lb 9.6 oz (64.229 kg)\    Objective:   Physical Exam  Nursing note and vitals reviewed. Constitutional: She is oriented to person, place, and time. She appears well-developed and well-nourished. No distress.  HENT:  Head: Normocephalic and atraumatic.  Right Ear: External ear normal.  Left Ear: External ear normal.  Nose: Nose normal.  Mouth/Throat: Oropharynx is clear and moist.  Eyes: Conjunctivae and EOM are normal.  Neck: Normal range of motion. Neck supple. No JVD present. No thyromegaly present.  Cardiovascular: Normal rate, regular rhythm, normal heart sounds and intact distal pulses.   Pulmonary/Chest: Effort normal and breath sounds normal.  Abdominal: Soft. Bowel sounds are normal. She exhibits no distension. There is no tenderness.  Musculoskeletal: Normal range of motion. She exhibits no edema and no tenderness.  Lymphadenopathy:    She has no cervical adenopathy.  Neurological: She is alert and oriented to person, place, and time. No cranial nerve deficit.  Skin: Skin is warm and dry. Rash noted. There is erythema. No pallor.     Psychiatric: She has a normal mood and affect. Her behavior is normal. Judgment and thought content normal.          Assessment & Plan:  1.  3 month F/U for Labile HTN, Cholesterol, Pre-Dm, D. Deficient. Needs healthy diet, cardio QD and obtain healthy weight. Check Labs, Check BP if >130/80 call office   2. Psoriasis- Advised Fish oil 1000 mg, DERM F/U advised, Use Cream AD

## 2013-09-30 NOTE — Patient Instructions (Signed)
Psoriasis Psoriasis is a long-lasting (chronic) skin problem. It can cause red bumps, a rash, scales that flake off, bleeding, and joint pain (arthritis). Psoriasis often affects the elbows, knees, groin, genitals, arms, legs, scalp, and nails. Psoriasis cannot be passed from person to person (not contagious).  HOME CARE  Only take medicine as told by your doctor.  Keep all doctor visits as told. You may need to try many treatments to find what works for you.  Avoid sunburn, cuts, scrapes, alcohol, smoking, and stress.  Wear gloves when you wash dishes, clean, or go outside in the cold.  Keep the air moist and cool in your home. You can use a humidifier.  Put lotion on right after a bath or shower.  Avoid long, hot baths and showers. Do not use a lot of soap.  Drink enough fluids to keep your pee (urine) clear or pale yellow. GET HELP RIGHT AWAY IF:   You have pain in the affected areas.  You have bleeding that does not stop in the affected areas.  You have more redness or warmth in the affected areas.  You have painful or stiff joints.  You feel depressed.  You have a fever. MAKE SURE YOU:  Understand these instructions.  Will watch your condition.  Will get help right away if you are not doing well or get worse. Document Released: 05/15/2004 Document Revised: 06/30/2011 Document Reviewed: 10/04/2010 [facility] Patient Information 2014 [place], [place].

## 2013-10-20 ENCOUNTER — Ambulatory Visit
Admission: RE | Admit: 2013-10-20 | Discharge: 2013-10-20 | Disposition: A | Discharge status: Home / Self Care | Payer: Medicare HMO | Source: Ambulatory Visit

## 2013-10-20 DIAGNOSIS — Z1231 Encounter for screening mammogram for malignant neoplasm of breast: Secondary | ICD-10-CM

## 2014-01-02 ENCOUNTER — Ambulatory Visit: Payer: Self-pay | Admitting: Physician Assistant

## 2014-01-04 ENCOUNTER — Ambulatory Visit: Payer: Self-pay | Admitting: Internal Medicine

## 2014-01-25 ENCOUNTER — Ambulatory Visit (INDEPENDENT_AMBULATORY_CARE_PROVIDER_SITE_OTHER): Payer: Medicare HMO | Admitting: Internal Medicine

## 2014-01-25 ENCOUNTER — Encounter: Payer: Self-pay | Admitting: Internal Medicine

## 2014-01-25 VITALS — BP 134/78 | HR 64 | Temp 97.9°F | Resp 16 | Ht 65.25 in | Wt 140.2 lb

## 2014-01-25 DIAGNOSIS — R7309 Other abnormal glucose: Secondary | ICD-10-CM

## 2014-01-25 DIAGNOSIS — Z79899 Other long term (current) drug therapy: Secondary | ICD-10-CM

## 2014-01-25 DIAGNOSIS — R030 Elevated blood-pressure reading, without diagnosis of hypertension: Secondary | ICD-10-CM

## 2014-01-25 DIAGNOSIS — R7303 Prediabetes: Secondary | ICD-10-CM

## 2014-01-25 DIAGNOSIS — E782 Mixed hyperlipidemia: Secondary | ICD-10-CM

## 2014-01-25 DIAGNOSIS — E559 Vitamin D deficiency, unspecified: Secondary | ICD-10-CM

## 2014-01-25 DIAGNOSIS — Z23 Encounter for immunization: Secondary | ICD-10-CM

## 2014-01-25 DIAGNOSIS — Z0001 Encounter for general adult medical examination with abnormal findings: Secondary | ICD-10-CM | POA: Insufficient documentation

## 2014-01-25 NOTE — Patient Instructions (Signed)

## 2014-01-25 NOTE — Progress Notes (Signed)
Patient ID: Madison Gonzalez, female   DOB: 13-Apr-1941, 73 y.o.   MRN: [id number]   This very nice 73 y.o.female presents for 3 month follow up with Hypertension, Hyperlipidemia, Pre-Diabetes and Vitamin D Deficiency.    Patient is monitored expectantly for HTN & BP has been controlled at home. Today's BP: 134/78 mmHg. Patient has had no complaints of any cardiac type chest pain, palpitations, dyspnea/orthopnea/PND, dizziness, claudication, or dependent edema.   Hyperlipidemia is controlled with diet & meds, but patient reports stopping her Lipitor about 4-6 weeks ago due to limiting myalgias. Patient denies myalgias or other med SE's. Last Lipids were Total Chol  206*; HDL  54; LDL 143*; Trig 45 on 09/30/2013.   Also, the patient has history of PreDiabetes and has had no symptoms of reactive hypoglycemia, diabetic polys, paresthesias or visual blurring.  Last A1c was  5.8% on 09/30/2013.    Further, the patient also has history of Vitamin D Deficiency and supplements vitamin D without any suspected side-effects. Last vitamin D was  56 on 06/29/2013.   Medication List   atorvastatin 80 MG tablet  Commonly known as:  LIPITOR  Take 1/2 to 1 tablet daily or as directed for cholesterol     calcipotriene 0.005 % ointment  Commonly known as:  DOVONOX  Apply topically 2 (two) times daily.     calcium carbonate 600 MG Tabs tablet  Commonly known as:  OS-CAL  Take 600 mg by mouth daily.     MAGNESIUM PO  Take by mouth daily.     multivitamin tablet  Take 1 tablet by mouth daily.     Vitamin D3 5000 UNITS Tabs  Take by mouth daily.     Allergies  Allergen Reactions  . Fosamax [Alendronate Sodium]     Myalgias   PMHx:   Past Medical History  Diagnosis Date  . Hypertension     Labile  . Hyperlipidemia   . IBS (irritable bowel syndrome)   . Psoriasis   . Osteopenia   . History of ITP    FHx:    Reviewed / unchanged  SHx:    Reviewed / unchanged  Systems  Review:  Constitutional: Denies fever, chills, wt changes, headaches, insomnia, fatigue, night sweats, change in appetite. Eyes: Denies redness, blurred vision, diplopia, discharge, itchy, watery eyes.  ENT: Denies discharge, congestion, post nasal drip, epistaxis, sore throat, earache, hearing loss, dental pain, tinnitus, vertigo, sinus pain, snoring.  CV: Denies chest pain, palpitations, irregular heartbeat, syncope, dyspnea, diaphoresis, orthopnea, PND, claudication or edema. Respiratory: denies cough, dyspnea, DOE, pleurisy, hoarseness, laryngitis, wheezing.  Gastrointestinal: Denies dysphagia, odynophagia, heartburn, reflux, water brash, abdominal pain or cramps, nausea, vomiting, bloating, diarrhea, constipation, hematemesis, melena, hematochezia  or hemorrhoids. Genitourinary: Denies dysuria, frequency, urgency, nocturia, hesitancy, discharge, hematuria or flank pain. Musculoskeletal: Denies arthralgias, myalgias, jt. swelling, pain, limping or strain/sprain. Describes some mild aching of her right neck Skin: Denies pruritus, rash, hives, warts, acne, eczema or change in skin lesion(s). Neuro: No weakness, tremor, incoordination, spasms, paresthesia or pain. Psychiatric: Denies confusion, memory loss or sensory loss. Endo: Denies change in weight, skin or hair change.  Heme/Lymph: No excessive bleeding, bruising or enlarged lymph nodes.  Exam:  BP 134/78  Pulse 64  Tem p97.9 F   Resp 16  Ht 5' 5.25" Wt 140 lb 3.2 oz   BMI 23.16 kg/m2  Appears well nourished and in no distress. Eyes: PERRLA, EOMs, conjunctiva no swelling or erythema. Sinuses: No frontal/maxillary tenderness ENT/Mouth: EAC's  clear, TM's nl w/o erythema, bulging. Nares clear w/o erythema, swelling, exudates. Oropharynx clear without erythema or exudates. Oral hygiene is good. Tongue normal, non obstructing. Hearing intact.  Neck: Supple. Thyroid nl. Car 2+/2+ without bruits, nodes or JVD. Chest: Respirations nl  with BS clear & equal w/o rales, rhonchi, wheezing or stridor.  Cor: Heart sounds normal w/ regular rate and rhythm without sig. murmurs, gallops, clicks, or rubs. Peripheral pulses normal and equal  without edema.  Abdomen: Soft & bowel sounds normal. Non-tender w/o guarding, rebound, hernias, masses, or organomegaly.  Lymphatics: Unremarkable.  Musculoskeletal: Full ROM all peripheral extremities, joint stability, 5/5 strength, and normal gait.  Skin: Warm, dry without exposed rashes, lesions or ecchymosis apparent.  Neuro: Cranial nerves intact, reflexes equal bilaterally. Sensory-motor testing grossly intact. Tendon reflexes grossly intact.  Pysch: Alert & oriented x 3.  Insight and judgement nl & appropriate. No ideations.  Assessment and Plan:  1. Elevated BP- Continue monitor blood pressure at home. Continue diet/meds same.  2. Hyperlipidemia - Continue diet and disposition of a different med  Since off lipitor, also recc. exercise,& lifestyle modifications. Continue monitor periodic cholesterol/liver & renal functions   3. Pre-Diabetes - Continue diet, exercise, lifestyle modifications. Monitor appropriate labs.  4. Vitamin D Deficiency - Continue supplementation.   Recommended regular exercise, BP monitoring, weight control, and discussed med and SE's. Recommended labs to assess and monitor clinical status. Further disposition pending results of labs.

## 2014-01-26 LAB — CBC WITH DIFFERENTIAL/PLATELET
BASOS ABS: 0.1 10*3/uL (ref 0.0–0.1)
BASOS PCT: 1 % (ref 0–1)
EOS PCT: 3 % (ref 0–5)
Eosinophils Absolute: 0.2 10*3/uL (ref 0.0–0.7)
HEMATOCRIT: 38.3 % (ref 36.0–46.0)
Hemoglobin: 12.8 g/dL (ref 12.0–15.0)
LYMPHS PCT: 29 % (ref 12–46)
Lymphs Abs: 1.6 10*3/uL (ref 0.7–4.0)
MCH: 30.3 pg (ref 26.0–34.0)
MCHC: 33.4 g/dL (ref 30.0–36.0)
MCV: 90.5 fL (ref 78.0–100.0)
MONO ABS: 0.3 10*3/uL (ref 0.1–1.0)
Monocytes Relative: 6 % (ref 3–12)
Neutro Abs: 3.4 10*3/uL (ref 1.7–7.7)
Neutrophils Relative %: 61 % (ref 43–77)
Platelets: 197 10*3/uL (ref 150–400)
RBC: 4.23 MIL/uL (ref 3.87–5.11)
RDW: 14.1 % (ref 11.5–15.5)
WBC: 5.5 10*3/uL (ref 4.0–10.5)

## 2014-01-26 LAB — BASIC METABOLIC PANEL WITH GFR
BUN: 18 mg/dL (ref 6–23)
CHLORIDE: 102 meq/L (ref 96–112)
CO2: 27 mEq/L (ref 19–32)
CREATININE: 0.64 mg/dL (ref 0.50–1.10)
Calcium: 9.2 mg/dL (ref 8.4–10.5)
GFR, EST NON AFRICAN AMERICAN: 88 mL/min
GFR, Est African American: >89 mL/min
GLUCOSE: 75 mg/dL (ref 70–99)
Potassium: 4.7 mEq/L (ref 3.5–5.3)
Sodium: 140 mEq/L (ref 135–145)

## 2014-01-26 LAB — HEMOGLOBIN A1C
Hgb A1c MFr Bld: 5.8 % — ABNORMAL HIGH (ref <5.7)
MEAN PLASMA GLUCOSE: 120 mg/dL — AB (ref <117)

## 2014-01-26 LAB — LIPID PANEL
Cholesterol: 203 mg/dL — ABNORMAL HIGH (ref 0–200)
HDL: 61 mg/dL (ref >39)
LDL Cholesterol: 132 mg/dL — ABNORMAL HIGH (ref 0–99)
TRIGLYCERIDES: 48 mg/dL (ref <150)
Total CHOL/HDL Ratio: 3.3 Ratio
VLDL: 10 mg/dL (ref 0–40)

## 2014-01-26 LAB — TSH: TSH: 0.748 u[IU]/mL (ref 0.350–4.500)

## 2014-01-26 LAB — HEPATIC FUNCTION PANEL
ALBUMIN: 4.1 g/dL (ref 3.5–5.2)
ALK PHOS: 82 U/L (ref 39–117)
ALT: 17 U/L (ref 0–35)
AST: 20 U/L (ref 0–37)
Bilirubin, Direct: 0.2 mg/dL (ref 0.0–0.3)
Indirect Bilirubin: 1 mg/dL (ref 0.2–1.2)
TOTAL PROTEIN: 6.6 g/dL (ref 6.0–8.3)
Total Bilirubin: 1.2 mg/dL (ref 0.2–1.2)

## 2014-01-26 LAB — MAGNESIUM: Magnesium: 2 mg/dL (ref 1.5–2.5)

## 2014-01-26 LAB — VITAMIN D 25 HYDROXY (VIT D DEFICIENCY, FRACTURES): Vit D, 25-Hydroxy: 63 ng/mL (ref 30–89)

## 2014-01-26 LAB — INSULIN, FASTING: Insulin fasting, serum: 1.6 u[IU]/mL — ABNORMAL LOW (ref 2.0–19.6)

## 2014-01-29 ENCOUNTER — Other Ambulatory Visit: Payer: Self-pay | Admitting: Internal Medicine

## 2014-01-29 MED ORDER — GEMFIBROZIL 600 MG PO TABS: ORAL_TABLET | ORAL | Status: DC

## 2014-03-14 ENCOUNTER — Encounter: Payer: Self-pay | Admitting: Emergency Medicine

## 2014-04-04 ENCOUNTER — Encounter: Payer: Self-pay | Admitting: Emergency Medicine

## 2014-05-08 ENCOUNTER — Encounter: Payer: Self-pay | Admitting: Emergency Medicine

## 2014-05-17 ENCOUNTER — Encounter: Payer: Self-pay | Admitting: Emergency Medicine

## 2014-06-30 ENCOUNTER — Encounter: Payer: Self-pay | Admitting: Emergency Medicine

## 2014-06-30 ENCOUNTER — Ambulatory Visit (INDEPENDENT_AMBULATORY_CARE_PROVIDER_SITE_OTHER): Payer: Medicare HMO | Admitting: Emergency Medicine

## 2014-06-30 VITALS — BP 142/80 | HR 64 | Temp 98.2°F | Resp 18 | Ht 65.25 in | Wt 147.0 lb

## 2014-06-30 DIAGNOSIS — M8580 Other specified disorders of bone density and structure, unspecified site: Secondary | ICD-10-CM

## 2014-06-30 DIAGNOSIS — R739 Hyperglycemia, unspecified: Secondary | ICD-10-CM

## 2014-06-30 DIAGNOSIS — E559 Vitamin D deficiency, unspecified: Secondary | ICD-10-CM

## 2014-06-30 DIAGNOSIS — E782 Mixed hyperlipidemia: Secondary | ICD-10-CM

## 2014-06-30 DIAGNOSIS — R030 Elevated blood-pressure reading, without diagnosis of hypertension: Secondary | ICD-10-CM

## 2014-06-30 DIAGNOSIS — Z1212 Encounter for screening for malignant neoplasm of rectum: Secondary | ICD-10-CM

## 2014-06-30 DIAGNOSIS — Z0000 Encounter for general adult medical examination without abnormal findings: Secondary | ICD-10-CM

## 2014-06-30 DIAGNOSIS — I10 Essential (primary) hypertension: Secondary | ICD-10-CM

## 2014-06-30 DIAGNOSIS — R0989 Other specified symptoms and signs involving the circulatory and respiratory systems: Secondary | ICD-10-CM

## 2014-06-30 DIAGNOSIS — R5383 Other fatigue: Secondary | ICD-10-CM

## 2014-06-30 DIAGNOSIS — Z23 Encounter for immunization: Secondary | ICD-10-CM

## 2014-06-30 DIAGNOSIS — R6889 Other general symptoms and signs: Secondary | ICD-10-CM

## 2014-06-30 DIAGNOSIS — R5381 Other malaise: Secondary | ICD-10-CM

## 2014-06-30 DIAGNOSIS — Z0001 Encounter for general adult medical examination with abnormal findings: Secondary | ICD-10-CM

## 2014-06-30 LAB — CBC WITH DIFFERENTIAL/PLATELET
Basophils Absolute: 0.1 10*3/uL (ref 0.0–0.1)
Basophils Relative: 1 % (ref 0–1)
Eosinophils Absolute: 0.2 10*3/uL (ref 0.0–0.7)
Eosinophils Relative: 4 % (ref 0–5)
HCT: 36.5 % (ref 36.0–46.0)
Hemoglobin: 12 g/dL (ref 12.0–15.0)
LYMPHS ABS: 1.7 10*3/uL (ref 0.7–4.0)
LYMPHS PCT: 32 % (ref 12–46)
MCH: 30 pg (ref 26.0–34.0)
MCHC: 32.9 g/dL (ref 30.0–36.0)
MCV: 91.3 fL (ref 78.0–100.0)
MONO ABS: 0.4 10*3/uL (ref 0.1–1.0)
MPV: 10.5 fL (ref 8.6–12.4)
Monocytes Relative: 8 % (ref 3–12)
NEUTROS ABS: 2.9 10*3/uL (ref 1.7–7.7)
Neutrophils Relative %: 55 % (ref 43–77)
Platelets: 175 10*3/uL (ref 150–400)
RBC: 4 MIL/uL (ref 3.87–5.11)
RDW: 14 % (ref 11.5–15.5)
WBC: 5.3 10*3/uL (ref 4.0–10.5)

## 2014-06-30 LAB — HEMOGLOBIN A1C
Hgb A1c MFr Bld: 5.8 % — ABNORMAL HIGH (ref <5.7)
MEAN PLASMA GLUCOSE: 120 mg/dL — AB (ref <117)

## 2014-06-30 MED ORDER — GEMFIBROZIL 600 MG PO TABS: ORAL_TABLET | ORAL | Status: DC

## 2014-06-30 NOTE — Patient Instructions (Addendum)
Fat and Cholesterol Control Diet Your diet has an affect on your fat and cholesterol levels in your blood and organs. Too much fat and cholesterol in your blood can affect your:  Heart.  Blood vessels (arteries, veins).  Gallbladder.  Liver.  Pancreas. CONTROL FAT AND CHOLESTEROL WITH DIET Certain foods raise cholesterol and others lower it. It is important to replace bad fats with other types of fat.  Do not eat:  Fatty meats, such as hot dogs and salami.  Stick margarine and some tub margarines that have "partially hydrogenated oils" in them.  Baked goods, such as cookies and crackers that have "partially hydrogenated oils" in them.  Saturated tropical oils, such as coconut and palm oil. Eat the following foods:  Round or loin cuts of red meat.  Chicken (without skin).  Fish.  Veal.  [place]ound [place] breast.  Shellfish.  Fruit, such as apples.  Vegetables, such as broccoli, potatoes, and carrots.  Beans, peas, and lentils (legumes).  Grains, such as barley, rice, couscous, and bulgar wheat.  Pasta (without cream sauces). Look for foods that are nonfat, low in fat, and low in cholesterol.  FIND FOODS THAT ARE LOWER IN FAT AND CHOLESTEROL  Find foods with soluble fiber and plant sterols (phytosterol). You should eat 2 grams a day of these foods. These foods include:  Fruits.  Vegetables.  Whole grains.  Dried beans and peas.  Nuts and seeds.  Read package labels. Look for low-saturated fats, trans fat free, low-fat foods.  Choose cheese that have only 2 to 3 grams of saturated fat per ounce.  Use heart-healthy tub margarine that is free of trans fat or partially hydrogenated oil.  Avoid buying baked goods that have partially hydrogenated oils in them. Instead, buy baked goods made with whole grains (whole-wheat or whole oat flour). Avoid baked goods labeled with "flour" or "enriched flour."  Buy non-creamy canned soups with reduced salt and no added  fats. PREPARING YOUR FOOD  Broil, bake, steam, or roast foods. Do not fry food.  Use non-stick cooking sprays.  Use lemon or herbs to flavor food instead of using butter or stick margarine.  Use nonfat yogurt, salsa, or low-fat dressings for salads. LOW-SATURATED FAT / LOW-FAT FOOD SUBSTITUTES  Meats / Saturated Fat (g)  Avoid: Steak, marbled (3 oz/85 g) / 11 g.  Choose: Steak, lean (3 oz/85 g) / 4 g.  Avoid: Hamburger (3 oz/85 g) / 7 g.  Choose: Hamburger, lean (3 oz/85 g) / 5 g.  Avoid: Ham (3 oz/85 g) / 6 g.  Choose: Ham, lean cut (3 oz/85 g) / 2.4 g.  Avoid: Chicken, with skin, dark meat (3 oz/85 g) / 4 g.  Choose: Chicken, skin removed, dark meat (3 oz/85 g) / 2 g.  Avoid: Chicken, with skin, light meat (3 oz/85 g) / 2.5 g.  Choose: Chicken, skin removed, light meat (3 oz/85 g) / 1 g. Dairy / Saturated Fat (g)  Avoid: Whole milk (1 cup) / 5 g.  Choose: Low-fat milk, 2% (1 cup) / 3 g.  Choose: Low-fat milk, 1% (1 cup) / 1.5 g.  Choose: Skim milk (1 cup) / 0.3 g.  Avoid: Hard cheese (1 oz/28 g) / 6 g.  Choose: Skim milk cheese (1 oz/28 g) / 2 to 3 g.  Avoid: Cottage cheese, 4% fat (1 cup) / 6.5 g.  Choose: Low-fat cottage cheese, 1% fat (1 cup) / 1.5 g.  Avoid: Ice cream (1 cup) / 9 g.  Choose: Sherbet (1 cup) / 2.5 g.  Choose: Nonfat frozen yogurt (1 cup) / 0.3 g.  Choose: Frozen fruit bar / trace.  Avoid: Whipped cream (1 tbs) / 3.5 g.  Choose: Nondairy whipped topping (1 tbs) / 1 g. Condiments / Saturated Fat (g)  Avoid: Mayonnaise (1 tbs) / 2 g.  Choose: Low-fat mayonnaise (1 tbs) / 1 g.  Avoid: Butter (1 tbs) / 7 g.  Choose: Extra light margarine (1 tbs) / 1 g.  Avoid: Coconut oil (1 tbs) / 11.8 g.  Choose: Olive oil (1 tbs) / 1.8 g.  Choose: Corn oil (1 tbs) / 1.7 g.  Choose: Safflower oil (1 tbs) / 1.2 g.  Choose: Sunflower oil (1 tbs) / 1.4 g.  Choose: Soybean oil (1 tbs) / 2.4 g .  Choose: Canola oil (1 tbs) / 1  g. Document Released: 10/07/2011 Document Revised: 12/08/2012 Document Reviewed: 07/07/2013 [facility] Patient Information 2015 [place], [place]. This information is not intended to replace advice given to you by your health care provider. Make sure you discuss any questions you have with your health care provider. Pneumococcal Vaccine, Polyvalent suspension for injection What is this medicine? PNEUMOCOCCAL VACCINE, POLYVALENT (NEU mo KOK al vak SEEN, pol ee VEY luhnt) is a vaccine to prevent pneumococcus bacteria infection. These bacteria are a major cause of ear infections, 'Strep throat' infections, and serious pneumonia, meningitis, or blood infections worldwide. These vaccines help the body to produce antibodies (protective substances) that help your body defend against these bacteria. This vaccine is recommended for infants and young children. This vaccine will not treat an infection. This medicine may be used for other purposes; ask your health care provider or pharmacist if you have questions. COMMON BRAND NAME(S): Prevnar 13 What should I tell my health care provider before I take this medicine? They need to know if you have any of these conditions: -bleeding problems -fever -immune system problems -low platelet count in the blood -seizures -an unusual or allergic reaction to pneumococcal vaccine, diphtheria toxoid, other vaccines, latex, other medicines, foods, dyes, or preservatives -pregnant or trying to get pregnant -breast-feeding How should I use this medicine? This vaccine is for injection into a muscle. It is given by a health care professional. A copy of Vaccine Information Statements will be given before each vaccination. Read this sheet carefully each time. The sheet may change frequently. Talk to your pediatrician regarding the use of this medicine in children. While this drug may be prescribed for children as young as 70 weeks old for selected conditions, precautions do  apply. Overdosage: If you think you have taken too much of this medicine contact a poison control center or emergency room at once. NOTE: This medicine is only for you. Do not share this medicine with others. What if I miss a dose? It is important not to miss your dose. Call your doctor or health care professional if you are unable to keep an appointment. What may interact with this medicine? -medicines for cancer chemotherapy -medicines that suppress your immune function -medicines that treat or prevent blood clots like warfarin, enoxaparin, and dalteparin -steroid medicines like prednisone or cortisone This list may not describe all possible interactions. Give your health care provider a list of all the medicines, herbs, non-prescription drugs, or dietary supplements you use. Also tell them if you smoke, drink alcohol, or use illegal drugs. Some items may interact with your medicine. What should I watch for while using this medicine? Mild fever and pain  should go away in 3 days or less. Report any unusual symptoms to your doctor or health care professional. What side effects may I notice from receiving this medicine? Side effects that you should report to your doctor or health care professional as soon as possible: -allergic reactions like skin rash, itching or hives, swelling of the face, lips, or tongue -breathing problems -confused -fever over 102 degrees F -pain, tingling, numbness in the hands or feet -seizures -unusual bleeding or bruising -unusual muscle weakness Side effects that usually do not require medical attention (report to your doctor or health care professional if they continue or are bothersome): -aches and pains -diarrhea -fever of 102 degrees F or less -headache -irritable -loss of appetite -pain, tender at site where injected -trouble sleeping This list may not describe all possible side effects. Call your doctor for medical advice about side effects. You may  report side effects to FDA at 1-800-FDA-1088. Where should I keep my medicine? This does not apply. This vaccine is given in a clinic, pharmacy, doctor's office, or other health care setting and will not be stored at home. NOTE: This sheet is a summary. It may not cover all possible information. If you have questions about this medicine, talk to your doctor, pharmacist, or health care provider.  2015, [employer]. (2008-06-20 10:17:22)

## 2014-06-30 NOTE — Progress Notes (Signed)
Patient ID: Madison Gonzalez, female   DOB: 1940-05-17, 74 y.o.   MRN: [id number]  MEDICARE ANNUAL WELLNESS VISIT AND CPE  Assessment:  1. CPE/ medicare wellness- Update screening labs/ History/ Immunizations/ Testing as needed. Advised healthy diet, QD exercise, increase H20 and continue RX/ Vitamins AD.   2.  3 month F/U for elevated BP w/o DX of HTN, Cholesterol, Pre-Dm, D. Deficient. Needs healthy diet, cardio QD and obtain healthy weight. Check Labs, Check BP if >130/80 call office   3. NONCOMPLIANT with cholesterol therapy with asymptomatic carotid bruits bilateral- Long discussion of risks with noncompliance of RX/ diet/ exercise. Advised can get U/S doppler evaluation but she declines since asymptomatic.  4. Fatigue vs sleep disturbance- check labs, increase activity and H2O, sleep hygiene discussion if no improvement  5. Psoriasis- Continue same and keep DERM follow up AD   Plan:   During the course of the visit the patient was educated and counseled about appropriate screening and preventive services including:    Pneumococcal vaccine   Influenza vaccine  Td vaccine  Screening electrocardiogram  Bone densitometry screening  Colorectal cancer screening  Diabetes screening  Glaucoma screening  Nutrition counseling   Advanced directives: requested  Conditions/risks identified: BMI: Discussed weight loss, diet, and increase physical activity.  Increase physical activity: AHA recommends 150 minutes of physical activity a week.  Medications reviewed PRE-Diabetes is at goal, ACE/ARB therapy: No, Reason not on Ace Inhibitor/ARB therapy:  n/a Urinary Incontinence is not an issue: discussed non pharmacology and pharmacology options.  Fall risk: low- discussed PT, home fall assessment, medications.    Subjective:  Madison Gonzalez is a 74 y.o. female who presents for Medicare Annual Wellness Visit and complete physical.  Date of last medicare wellness visit  is unknown.  She notes seasonal blues have improved with sunnier weather. She has had mild fatigue with difficulty staying asleep on/ off but denies sleep apnea.  She has had elevated blood pressure for several years but notes normal at home (white coat). Her blood pressure has been controlled at home, today their BP is BP: (!) 142/80 mmHg She does not workout. She denies chest pain, shortness of breath, dizziness.  She is not on cholesterol medication and denies myalgias. Her cholesterol is not at goal. The cholesterol last visit was:   Lab Results  Component Value Date   CHOL 203* 01/25/2014   HDL 61 01/25/2014   LDLCALC 132* 01/25/2014   TRIG 48 01/25/2014   CHOLHDL 3.3 01/25/2014   She has had pre-diabetes for a few years. She has not been working on diet and exercise for prediabetes, and denies foot ulcerations and paresthesia of the feet. Last A1C in the office was:  Lab Results  Component Value Date   HGBA1C 5.8* 01/25/2014   Patient is on Vitamin D supplement.   Lab Results  Component Value Date   VD25OH 63 01/25/2014      Medication Review: Current Outpatient Prescriptions on File Prior to Visit  Medication Sig Dispense Refill  . calcipotriene (DOVONOX) 0.005 % ointment Apply topically 2 (two) times daily. 60 g 1  . calcium carbonate (OS-CAL) 600 MG TABS tablet Take 600 mg by mouth daily.    . Cholecalciferol (VITAMIN D3) 5000 UNITS TABS Take by mouth daily.    Marland Kitchen MAGNESIUM PO Take by mouth daily.    . Multiple Vitamin (MULTIVITAMIN) tablet Take 1 tablet by mouth daily.     No current facility-administered medications on file prior to  visit.   Allergies  Allergen Reactions  . Atorvastatin     myalgias  . Fosamax [Alendronate Sodium]     Myalgias     Current Problems (verified) Patient Active Problem List   Diagnosis Date Noted  . Medication management 01/25/2014  . Elevated BP w/o Dx HTN 06/29/2013  . Hyperlipidemia 06/29/2013  . Vitamin D deficiency  06/29/2013  . Prediabetes 06/29/2013  . Encounter for long-term (current) use of other medications 06/29/2013  . IBS (irritable bowel syndrome)   . Psoriasis   . Osteopenia   . History of ITP    Past Medical History  Diagnosis Date  . Hypertension     Labile  . Hyperlipidemia   . IBS (irritable bowel syndrome)   . Psoriasis   . Osteopenia   . History of ITP     Screening Tests Immunization History  Administered Date(s) Administered  . Influenza, High Dose Seasonal PF 01/25/2014  . PPD Test 03/14/2013  . Pneumococcal Polysaccharide-23 02/08/2009  . Td 02/08/2009  . Zoster 03/29/2009    Preventative care: Last colonoscopy: REFUSES Last mammogram: 10/20/13 Last pap smear/pelvic exam: 2011 DEXA:2013 Osteopenia  Prior vaccinations: TD: 2010  Influenza: 2015  Pneumococcal: 2010/ 2016 Prevnar13: At Next visit office out of stock Shingles/Zostavax: 2010  Names of Other Physician/Practitioners you currently use: Patient Care Team: Unk Pinto, MD as PCP - General (Internal Medicine)  SURGICAL HISTORY Past Surgical History  Procedure Laterality Date  . Breast surgery Right     Biospy, benign  . Abdominal hysterectomy    . Eye surgery Bilateral     Cataracts  . Eye surgery      Lense implants  . Appendectomy    . Tubal ligation     FAMILY HISTORY Family History  Problem Relation Age of Onset  . AAA (abdominal aortic aneurysm) Mother   . Hypertension Mother   . Hypertension Father   . Heart disease Father   . Hyperlipidemia Father   . Cancer Maternal Aunt     uterine   SOCIAL HISTORY History  Substance Use Topics  . Smoking status: Never Smoker   . Smokeless tobacco: Not on file  . Alcohol Use: Yes     Comment: occasionl    MEDICARE WELLNESS OBJECTIVES: Tobacco use: She does not smoke.  Patient is not a former smoker. Alcohol Current alcohol use: social drinker Caffeine Current caffeine use: coffee 1 /day Diet: in general, a "healthy" diet    Physical activity: yard work Fall risk:  Low Risk Osteoporosis: postmenopausal estrogen deficiency, History of fracture in the past year: no Depression/mood screen:  Yes - No Depression Hearing: normal Visual acuity: normal,  does perform annual eye exam  ADLs: self care Home safety: excellent Cognitive Testing  Alert? Yes  Normal Appearance?Yes  Oriented to person? Yes  Place? Yes   Time? Yes  Recall of three objects?  Yes  Can perform simple calculations? Yes  Displays appropriate judgment?Yes  Can read the correct time from a watch face?Yes EOL planning: No     Objective:     Blood pressure 142/80, pulse 64, temperature 98.2 F (36.8 C), temperature source Temporal, resp. rate 18, height 5' 5.25" (1.657 m), weight 147 lb (66.679 kg). Body mass index is 24.29 kg/(m^2).  General appearance: alert, no distress, WD/WN, female HEENT: normocephalic, sclerae anicteric, TMs cloudy, nares patent, no discharge or erythema, pharynx normal Oral cavity: MMM, no lesions Neck: supple, no lymphadenopathy, no thyromegaly, no masses,bilateral carotid bruits Heart: RRR,  normal S1, S2, no murmurs Lungs: CTA bilaterally, no wheezes, rhonchi, or rales Abdomen: +bs, soft, non tender, non distended, no masses, no hepatomegaly, no splenomegaly Musculoskeletal: nontender, no swelling, no obvious deformity Extremities: no edema, no cyanosis, no clubbing Pulses: 2+ symmetric, upper and lower extremities, normal cap refill Skin: WNL, no change with psoriasis bilateral LE with erythematous patches  Breasts:WNL GYN: Manual only- WNL Neurological: alert, oriented x 3, CN2-12 intact, strength normal upper extremities and lower extremities, sensation normal throughout, DTRs 2+ throughout, no cerebellar signs, gait normal Psychiatric: normal affect, behavior normal, pleasant   Medicare Attestation I have personally reviewed: The patient's medical and social history Their use of alcohol, tobacco or  illicit drugs Their current medications and supplements The patient's functional ability including ADLs,fall risks, home safety risks, cognitive, and hearing and visual impairment Diet and physical activities Evidence for depression or mood disorders  The patient's weight, height, BMI, and visual acuity have been recorded in the chart.  I have made referrals, counseling, and provided education to the patient based on review of the above and I have provided the patient with a written personalized care plan for preventive services.     Ardis Hughs, [place]   06/30/2014

## 2014-07-01 LAB — HEPATIC FUNCTION PANEL
ALT: 18 U/L (ref 0–35)
AST: 20 U/L (ref 0–37)
Albumin: 4.1 g/dL (ref 3.5–5.2)
Alkaline Phosphatase: 76 U/L (ref 39–117)
BILIRUBIN DIRECT: 0.1 mg/dL (ref 0.0–0.3)
BILIRUBIN INDIRECT: 0.7 mg/dL (ref 0.2–1.2)
Total Bilirubin: 0.8 mg/dL (ref 0.2–1.2)
Total Protein: 6.4 g/dL (ref 6.0–8.3)

## 2014-07-01 LAB — URINALYSIS, ROUTINE W REFLEX MICROSCOPIC
Bilirubin Urine: NEGATIVE
Glucose, UA: NEGATIVE mg/dL
Hgb urine dipstick: NEGATIVE
Ketones, ur: NEGATIVE mg/dL
Leukocytes, UA: NEGATIVE
Nitrite: NEGATIVE
PH: 6.5 (ref 5.0–8.0)
PROTEIN: NEGATIVE mg/dL
Specific Gravity, Urine: 1.022 (ref 1.005–1.030)
Urobilinogen, UA: 0.2 mg/dL (ref 0.0–1.0)

## 2014-07-01 LAB — MICROALBUMIN / CREATININE URINE RATIO
Creatinine, Urine: 136.7 mg/dL
MICROALB/CREAT RATIO: 3.7 mg/g (ref 0.0–30.0)
Microalb, Ur: 0.5 mg/dL (ref <2.0)

## 2014-07-01 LAB — TSH: TSH: 1.719 u[IU]/mL (ref 0.350–4.500)

## 2014-07-01 LAB — MAGNESIUM: MAGNESIUM: 2 mg/dL (ref 1.5–2.5)

## 2014-07-01 LAB — BASIC METABOLIC PANEL WITH GFR
BUN: 14 mg/dL (ref 6–23)
CALCIUM: 9.1 mg/dL (ref 8.4–10.5)
CO2: 29 meq/L (ref 19–32)
Chloride: 105 mEq/L (ref 96–112)
Creat: 0.65 mg/dL (ref 0.50–1.10)
GFR, Est African American: >89 mL/min
GFR, Est Non African American: 88 mL/min
Glucose, Bld: 85 mg/dL (ref 70–99)
Potassium: 4.2 mEq/L (ref 3.5–5.3)
SODIUM: 144 meq/L (ref 135–145)

## 2014-07-01 LAB — INSULIN, FASTING: Insulin fasting, serum: 1.7 u[IU]/mL — ABNORMAL LOW (ref 2.0–19.6)

## 2014-07-01 LAB — LIPID PANEL
Cholesterol: 193 mg/dL (ref 0–200)
HDL: 51 mg/dL (ref >=46)
LDL CALC: 129 mg/dL — AB (ref 0–99)
Total CHOL/HDL Ratio: 3.8 Ratio
Triglycerides: 63 mg/dL (ref <150)
VLDL: 13 mg/dL (ref 0–40)

## 2014-07-01 LAB — VITAMIN D 25 HYDROXY (VIT D DEFICIENCY, FRACTURES): VIT D 25 HYDROXY: 39 ng/mL (ref 30–100)

## 2014-07-10 ENCOUNTER — Encounter: Payer: Self-pay | Admitting: *Deleted

## 2014-09-02 ENCOUNTER — Emergency Department (HOSPITAL_COMMUNITY): Payer: Medicare HMO

## 2014-09-02 ENCOUNTER — Encounter (HOSPITAL_COMMUNITY): Payer: Self-pay | Admitting: Family Medicine

## 2014-09-02 ENCOUNTER — Emergency Department (HOSPITAL_COMMUNITY)
Admission: EM | Admit: 2014-09-02 | Discharge: 2014-09-02 | Disposition: A | Discharge status: Home / Self Care | Payer: Medicare HMO | Attending: Emergency Medicine | Admitting: Emergency Medicine

## 2014-09-02 DIAGNOSIS — E785 Hyperlipidemia, unspecified: Secondary | ICD-10-CM | POA: Diagnosis not present

## 2014-09-02 DIAGNOSIS — S82001A Unspecified fracture of right patella, initial encounter for closed fracture: Secondary | ICD-10-CM

## 2014-09-02 DIAGNOSIS — Y9389 Activity, other specified: Secondary | ICD-10-CM | POA: Diagnosis not present

## 2014-09-02 DIAGNOSIS — Z862 Personal history of diseases of the blood and blood-forming organs and certain disorders involving the immune mechanism: Secondary | ICD-10-CM | POA: Insufficient documentation

## 2014-09-02 DIAGNOSIS — W1830XA Fall on same level, unspecified, initial encounter: Secondary | ICD-10-CM | POA: Insufficient documentation

## 2014-09-02 DIAGNOSIS — S8001XA Contusion of right knee, initial encounter: Secondary | ICD-10-CM | POA: Diagnosis not present

## 2014-09-02 DIAGNOSIS — S6991XA Unspecified injury of right wrist, hand and finger(s), initial encounter: Secondary | ICD-10-CM | POA: Diagnosis not present

## 2014-09-02 DIAGNOSIS — Z79899 Other long term (current) drug therapy: Secondary | ICD-10-CM | POA: Insufficient documentation

## 2014-09-02 DIAGNOSIS — R630 Anorexia: Secondary | ICD-10-CM | POA: Diagnosis not present

## 2014-09-02 DIAGNOSIS — Z872 Personal history of diseases of the skin and subcutaneous tissue: Secondary | ICD-10-CM | POA: Insufficient documentation

## 2014-09-02 DIAGNOSIS — Y929 Unspecified place or not applicable: Secondary | ICD-10-CM | POA: Insufficient documentation

## 2014-09-02 DIAGNOSIS — Z8739 Personal history of other diseases of the musculoskeletal system and connective tissue: Secondary | ICD-10-CM | POA: Diagnosis not present

## 2014-09-02 DIAGNOSIS — Z8719 Personal history of other diseases of the digestive system: Secondary | ICD-10-CM | POA: Insufficient documentation

## 2014-09-02 DIAGNOSIS — S82091A Other fracture of right patella, initial encounter for closed fracture: Secondary | ICD-10-CM | POA: Diagnosis not present

## 2014-09-02 DIAGNOSIS — Z01818 Encounter for other preprocedural examination: Secondary | ICD-10-CM | POA: Diagnosis not present

## 2014-09-02 DIAGNOSIS — Y999 Unspecified external cause status: Secondary | ICD-10-CM | POA: Insufficient documentation

## 2014-09-02 DIAGNOSIS — S8291XA Unspecified fracture of right lower leg, initial encounter for closed fracture: Secondary | ICD-10-CM | POA: Diagnosis present

## 2014-09-02 DIAGNOSIS — W19XXXA Unspecified fall, initial encounter: Secondary | ICD-10-CM

## 2014-09-02 LAB — CBC WITH DIFFERENTIAL/PLATELET
Basophils Absolute: 0.1 10*3/uL (ref 0.0–0.1)
Basophils Relative: 1 % (ref 0–1)
EOS ABS: 0.1 10*3/uL (ref 0.0–0.7)
Eosinophils Relative: 1 % (ref 0–5)
HCT: 35.6 % — ABNORMAL LOW (ref 36.0–46.0)
Hemoglobin: 11.9 g/dL — ABNORMAL LOW (ref 12.0–15.0)
LYMPHS ABS: 1.8 10*3/uL (ref 0.7–4.0)
Lymphocytes Relative: 13 % (ref 12–46)
MCH: 30.2 pg (ref 26.0–34.0)
MCHC: 33.4 g/dL (ref 30.0–36.0)
MCV: 90.4 fL (ref 78.0–100.0)
MONOS PCT: 6 % (ref 3–12)
Monocytes Absolute: 0.8 10*3/uL (ref 0.1–1.0)
NEUTROS ABS: 11 10*3/uL — AB (ref 1.7–7.7)
Neutrophils Relative %: 79 % — ABNORMAL HIGH (ref 43–77)
PLATELETS: 289 10*3/uL (ref 150–400)
RBC: 3.94 MIL/uL (ref 3.87–5.11)
RDW: 13.7 % (ref 11.5–15.5)
WBC: 13.8 10*3/uL — ABNORMAL HIGH (ref 4.0–10.5)

## 2014-09-02 LAB — COMPREHENSIVE METABOLIC PANEL
ALT: 23 U/L (ref 14–54)
AST: 33 U/L (ref 15–41)
Albumin: 4 g/dL (ref 3.5–5.0)
Alkaline Phosphatase: 78 U/L (ref 38–126)
Anion gap: 11 (ref 5–15)
BUN: 21 mg/dL — AB (ref 6–20)
CALCIUM: 9.3 mg/dL (ref 8.9–10.3)
CHLORIDE: 101 mmol/L (ref 101–111)
CO2: 24 mmol/L (ref 22–32)
CREATININE: 0.76 mg/dL (ref 0.44–1.00)
GFR calc non Af Amer: >60 mL/min (ref >60)
Glucose, Bld: 138 mg/dL — ABNORMAL HIGH (ref 65–99)
POTASSIUM: 4 mmol/L (ref 3.5–5.1)
SODIUM: 136 mmol/L (ref 135–145)
TOTAL PROTEIN: 6.8 g/dL (ref 6.5–8.1)
Total Bilirubin: 1.1 mg/dL (ref 0.3–1.2)

## 2014-09-02 LAB — PROTIME-INR
INR: 1.03 (ref 0.00–1.49)
Prothrombin Time: 13.6 seconds (ref 11.6–15.2)

## 2014-09-02 MED ORDER — SODIUM CHLORIDE 0.9 % IV BOLUS (SEPSIS)
1000.0000 mL | Freq: Once | INTRAVENOUS | Status: AC
  Administered 2014-09-02: 1000 mL via INTRAVENOUS

## 2014-09-02 MED ORDER — FENTANYL CITRATE (PF) 100 MCG/2ML IJ SOLN
50.0000 ug | Freq: Once | INTRAMUSCULAR | Status: AC
  Administered 2014-09-02: 50 ug via INTRAVENOUS
  Filled 2014-09-02: qty 2

## 2014-09-02 MED ORDER — OXYCODONE-ACETAMINOPHEN 5-325 MG PO TABS
ORAL_TABLET | ORAL | Status: AC
  Filled 2014-09-02: qty 1

## 2014-09-02 MED ORDER — FENTANYL CITRATE (PF) 100 MCG/2ML IJ SOLN
50.0000 ug | Freq: Once | INTRAMUSCULAR | Status: AC | PRN
  Administered 2014-09-02: 50 ug via INTRAVENOUS
  Filled 2014-09-02: qty 2

## 2014-09-02 MED ORDER — HYDROCODONE-ACETAMINOPHEN 5-325 MG PO TABS: 1.0000 | ORAL_TABLET | Freq: Four times a day (QID) | ORAL | Status: DC | PRN

## 2014-09-02 MED ORDER — OXYCODONE-ACETAMINOPHEN 5-325 MG PO TABS: 1.0000 | ORAL_TABLET | Freq: Once | ORAL | Status: DC

## 2014-09-02 MED ORDER — ONDANSETRON HCL 4 MG/2ML IJ SOLN
4.0000 mg | Freq: Once | INTRAMUSCULAR | Status: DC
  Filled 2014-09-02: qty 2

## 2014-09-02 MED ORDER — FOLDING WALKER MISC: 1.0000 | Freq: Every day | Status: DC

## 2014-09-02 NOTE — ED Notes (Signed)
Patient transported to X-ray 

## 2014-09-02 NOTE — ED Provider Notes (Signed)
CSN: [id number]     Arrival date & time 09/02/14  1541 History   First MD Initiated Contact with Patient 09/02/14 1616     Chief Complaint  Patient presents with  . Knee Injury     (Consider location/radiation/quality/duration/timing/severity/associated sxs/prior Treatment) HPI   PCP: Alesia Richards, MD Blood pressure 121/75, pulse 77, temperature 98.3 F (36.8 C), temperature source Oral, resp. rate 18, SpO2 98 %.  Madison Gonzalez is a 74 y.o.female with a significant PMH of hyperlipidemia, IBS, psoriasis, osteopenia presents to the ER with complaints of right knee and hand injury. The patient was sent here from an urgent care [place]. She has only had one 80  Calorie sausage link today in attempt to loose 10 lbs. She also reports not eating much. She was out in the garden and missed a step, falling onto her chest, right hand and right knee. She did not hit her head or injure her neck, she is not on blood thinners. On initial presentation the patients BP was 146/71, she at the time declined pain medication. She reported becoming very hot and nauseas when the pain became overwhelmingly severe. Triage repeated her BP before giving pain medications at the patiens request for medication and noted her BP had dropped to 83/45. She was quickly moved back to a bed and laid flat. Within 10 minutes her pain improved, then the nausea improved and her BP returned to 121/75. Pt denies headache, CP, neck pain, back pain, SOB or feeling weak, no loss of bowel or urine control.   Past Medical History  Diagnosis Date  . Hyperlipidemia   . IBS (irritable bowel syndrome)   . Psoriasis   . Osteopenia   . History of ITP    Past Surgical History  Procedure Laterality Date  . Breast surgery Right     Biospy, benign  . Abdominal hysterectomy    . Eye surgery Bilateral     Cataracts  . Eye surgery      Lense implants  . Appendectomy    . Tubal ligation     Family History   Problem Relation Age of Onset  . AAA (abdominal aortic aneurysm) Mother   . Hypertension Mother   . Hypertension Father   . Heart disease Father   . Hyperlipidemia Father   . Cancer Maternal Aunt     uterine   History  Substance Use Topics  . Smoking status: Never Smoker   . Smokeless tobacco: Not on file  . Alcohol Use: Yes     Comment: occasionl   OB History    No data available     Review of Systems  10 Systems reviewed and are negative for acute change except as noted in the HPI.     Allergies  Atorvastatin and Fosamax  Home Medications   Prior to Admission medications   Medication Sig Start Date End Date Taking? Authorizing Provider  calcipotriene (DOVONOX) 0.005 % ointment Apply topically 2 (two) times daily. Patient taking differently: Apply 1 application topically 2 (two) times daily as needed (psoriasis).  09/30/13  Yes Melissa Smith, PA-C  Calcium Carbonate-Vitamin D (CALCIUM + D PO) Take 1 tablet by mouth daily.   Yes Historical Provider, MD  Cholecalciferol (VITAMIN D3) 5000 UNITS TABS Take 5,000-10,000 Units by mouth daily. Take 2 capsules (10,000 units) on Tuesday, Thursday, Saturday, take 1 capsule (5,000 units) on Sunday, Monday, Wednesday, Friday   Yes Historical Provider, MD  gemfibrozil (LOPID) 600 MG tablet Take  1 tablet 2 x day with meal for cholesterol Patient taking differently: Take 600 mg by mouth 2 (two) times daily before a meal. for cholesterol 06/30/14 11/29/14 Yes Melissa Smith, PA-C  MAGNESIUM PO Take 1 tablet by mouth daily.    Yes Historical Provider, MD  Multiple Vitamin (MULTIVITAMIN WITH MINERALS) TABS tablet Take 1 tablet by mouth daily.   Yes Historical Provider, MD   BP 136/69 mmHg  Pulse 80  Temp(Src) 98.3 F (36.8 C) (Oral)  Resp 20  SpO2 93% Physical Exam  Constitutional: She is oriented to person, place, and time. She appears well-developed and well-nourished. No distress.  HENT:  Head: Normocephalic and atraumatic. Head is  without raccoon's eyes, without Battle's sign, without abrasion, without contusion, without right periorbital erythema and without left periorbital erythema.  Right Ear: Tympanic membrane and ear canal normal.  Left Ear: Tympanic membrane and ear canal normal.  Nose: Nose normal.  Eyes: Pupils are equal, round, and reactive to light.  Neck: Normal range of motion. Neck supple. No spinous process tenderness and no muscular tenderness present.  Cardiovascular: Normal rate and regular rhythm.   Pulmonary/Chest: Effort normal and breath sounds normal. She exhibits no tenderness, no bony tenderness, no laceration, no crepitus, no swelling and no retraction.  Abdominal: Soft.  Musculoskeletal:       Right knee: She exhibits decreased range of motion, swelling, effusion and ecchymosis. She exhibits no deformity, no laceration, no erythema, normal alignment, no LCL laxity, normal patellar mobility, no bony tenderness, normal meniscus and no MCL laxity. Tenderness found. No medial joint line, no lateral joint line, no MCL, no LCL and no patellar tendon tenderness noted.       Right hand: She exhibits tenderness, bony tenderness and swelling. She exhibits normal range of motion, normal two-point discrimination, normal capillary refill, no deformity and no laceration. Normal sensation noted. Normal strength noted.  Pulses are palpable and symmetrical. CR < 3 seconds FROM of all 5 toes  Neurological: She is alert and oriented to person, place, and time. No cranial nerve deficit.  Skin: Skin is warm and dry.  Nursing note and vitals reviewed.   ED Course  Procedures (including critical care time) Labs Review Labs Reviewed  COMPREHENSIVE METABOLIC PANEL - Abnormal; Notable for the following:    Glucose, Bld 138 (*)    BUN 21 (*)    All other components within normal limits  CBC WITH DIFFERENTIAL/PLATELET - Abnormal; Notable for the following:    WBC 13.8 (*)    Hemoglobin 11.9 (*)    HCT 35.6 (*)     Neutrophils Relative % 79 (*)    Neutro Abs 11.0 (*)    All other components within normal limits  PROTIME-INR  CBG MONITORING, ED    Imaging Review Dg Chest 1 View  09/02/2014   CLINICAL DATA:  Status post fall onto right knee. Concern for chest injury. Initial encounter.  EXAM: CHEST  1 VIEW  COMPARISON:  None.  FINDINGS: The lungs are well-aerated. Vascular congestion is noted. Mildly increased interstitial markings may be transient in nature. There is no evidence of focal opacification, pleural effusion or pneumothorax.  The cardiomediastinal silhouette is borderline normal in size. No acute osseous abnormalities are seen.  IMPRESSION: Vascular congestion noted. Mildly increased interstitial markings may be transient in nature. No displaced rib fracture seen.   Electronically Signed   By: Garald Balding M.D.   On: 09/02/2014 17:51   Dg Knee Complete 4 Views Right  09/02/2014   CLINICAL DATA:  Status post fall onto right knee. Concern for injury. Initial encounter.  EXAM: RIGHT KNEE - COMPLETE 4+ VIEW  COMPARISON:  None.  FINDINGS: There is a minimally displaced horizontal fracture extending across the superior aspect of the patella, with approximately 3 mm of step-off suggested at the articular surface on the lateral view.  An associated moderate knee joint effusion is noted, and edema is seen within Hoffa's fat pad. Focal cortical irregularity is seen projecting over the inferior trochlea, possibly reflecting remote injury. Would correlate for any evidence of PCL tear.  Mild cortical irregularity is noted along the medial compartment, with marginal osteophyte formation. No significant joint space narrowing is seen.  IMPRESSION: 1. Minimally displaced horizontal fracture extending across the superior aspect of the patella, with 3 mm of step-off suggested at the articular surface on the lateral view. 2. Associated moderate knee joint effusion, with edema at Hoffa's fat pad. 3. Focal cortical  irregularity along the inferior trochlea may reflect remote injury. Would correlate for any evidence of PCL tear. 4. Mild cortical irregularity along the medial compartment, with marginal osteophyte formation. No significant joint space narrowing seen.   Electronically Signed   By: Garald Balding M.D.   On: 09/02/2014 17:55   Dg Hand Complete Right  09/02/2014   CLINICAL DATA:  Fall on right hand today with pain and swelling. Initial encounter. History of psoriasis  EXAM: RIGHT HAND - COMPLETE 3+ VIEW  COMPARISON:  None.  FINDINGS: There is no evidence of acute fracture, subluxation or dislocation.  Moderate -severe degenerative changes at the second and third PIP joints and first carpometacarpal joint noted.  No other focal bony abnormalities are present.  IMPRESSION: No evidence of acute bony abnormality.  Degenerative changes   Electronically Signed   By: Margarette Canada M.D.   On: 09/02/2014 17:53     EKG Interpretation   Date/Time:  Saturday Sep 02 2014 16:47:44 EDT Ventricular Rate:  75 PR Interval:  164 QRS Duration: 99 QT Interval:  408 QTC Calculation: 456 R Axis:   -29 Text Interpretation:  Sinus rhythm Borderline left axis deviation Baseline  wander in lead(s) V5 No significant change since last tracing Confirmed by  [facility]  MD, [place] [phone]) on 09/02/2014 6:02:00 PM      MDM   Final diagnoses:  Pre-op exam  Patellar fracture, right, closed, initial encounter   Medications  ondansetron (ZOFRAN) injection 4 mg (0 mg Intravenous Hold 09/02/14 1839)  fentaNYL (SUBLIMAZE) injection 50 mcg (not administered)  sodium chloride 0.9 % bolus 1,000 mL (1,000 mLs Intravenous New Bag/Given 09/02/14 1633)  fentaNYL (SUBLIMAZE) injection 50 mcg (50 mcg Intravenous Given 09/02/14 1835)    I spoke with the Orthopedic Physician from [facility], the patient is to be placed in a knee immobilizer, she can be weight bearing but must stay in the immobilizer. She knees to keep her leg elevated.  Needs Jones dressing and knee immobilizer. The patient had a syncopal episode in triage but since then her BP has remained stable and she had no further episodes.  Rx: Vicodin  74 y.o.Veronique Warga Adee's evaluation in the Emergency Department is complete. It has been determined that no acute conditions requiring further emergency intervention are present at this time. The patient/guardian have been advised of the diagnosis and plan. We have discussed signs and symptoms that warrant return to the ED, such as changes or worsening in symptoms.  Vital signs are stable at discharge. Filed  Vitals:   09/02/14 1700  BP: 136/69  Pulse: 80  Temp:   Resp: 20    Patient/guardian has voiced understanding and agreed to follow-up with the PCP or specialist.     Delos Haring, PA-C 09/02/14 [place], PA-C 09/02/14 [place], MD 09/02/14 [phone]

## 2014-09-02 NOTE — ED Notes (Signed)
Assisted PT with walker device to rest room. PT was okay however walker device will be needed for home use. PT was okay using device with small amount of pain 4/10

## 2014-09-02 NOTE — ED Notes (Signed)
Pt here with xrays and positive right knee fracture. sts was carrying boxes and fell on the concrete.

## 2014-09-02 NOTE — Progress Notes (Signed)
Orthopedic Tech Progress Note Patient Details:  Madison Gonzalez [facility] 05/14/1940 [id number]  Ortho Devices Type of Ortho Device: Knee Immobilizer, Ace wrap, Jari Pigg splint Ortho Device/Splint Interventions: Application   Irish Elders 09/02/2014, 7:29 PM

## 2014-09-02 NOTE — ED Notes (Signed)
Pt moved back to room and laid down in bed pts blood pressure returned to 121/75. PA Tiffany and RN traci at bedside.

## 2014-09-02 NOTE — Discharge Instructions (Signed)
Patellar Fracture, Adult A patellar fracture is a break in your kneecap (patella).  CAUSES   A direct blow to the knee or a fall is usually the cause of a broken patella.  A very hard and strong bending of your knee can cause a patellar fracture. RISK FACTORS Involvement in contact sports, especially sports that involve a lot of jumping. SIGNS AND SYMPTOMS   Tender and swollen knee.  Pain when you move your knee, especially when you try to straighten out your leg.  Difficulty walking or putting weight on your knee.  Misshapen knee (as if a bone is out of place). DIAGNOSIS  Patellar fracture is usually diagnosed with a physical exam and an X-ray exam. TREATMENT  Treatment depends on the type of fracture:  If your patella is still in the right position after the fracture and you can still straighten your leg out, you can usually be treated with a splint or cast for 4-6 weeks.  If your patella is broken into multiple small pieces but you are able to straighten your leg, you can usually be treated with a splint or cast for 4-6 weeks. Sometimes your patella may need to be removed before the cast is applied.  If you cannot straighten out your leg after a patellar fracture, then surgery is required to hold the bony fragments together until they heal. A cast or splint will be applied for 4-6 weeks. HOME CARE INSTRUCTIONS   Only take over-the-counter or prescription medicines for pain, discomfort, or fever as directed by your health care provider.  Use crutches as directed, and exercise the leg as directed.  Apply ice to the injured area:  Put ice in a plastic bag.  Place a towel between your skin and the bag.  Leave the ice on for 20 minutes, 2-3 times a day.  Elevate the affected knee above the level of your heart. SEEK MEDICAL CARE IF:  You suspect you have significantly injured your knee.  You hear a pop after a knee injury.  Your knee is misshapen after a knee  injury.  You have pain when you move your knee.  You have difficulty walking or putting weight on your knee.  You cannot fully move your knee. SEEK IMMEDIATE MEDICAL CARE IF:  You have redness, swelling, or increasing pain in your knee.  You have a fever. Document Released: 01/04/2003 Document Revised: 01/26/2013 Document Reviewed: 11/17/2012 [facility] Patient Information 2015 [place], [place]. This information is not intended to replace advice given to you by your health care provider. Make sure you discuss any questions you have with your health care provider. Knee Immobilizer A knee immobilizer is used to support and protect an injured or painful knee. Knee immobilizers keep your knee from being used while it is healing. Some of the common immobilizers used include splints (air, plaster, fiberglass, stiff cloth, or aluminum) or casts. Wear your knee immobilizer as instructed and only remove it as instructed. HOME CARE INSTRUCTIONS   Use absorbent powder (such as baby powder or talcum powder) to control irritation from sweat and friction.  Adjust the immobilizer to be firm but not tight. Signs of an immobilizer that is too tight include:  Swelling.  Numbness.  Color change in your foot or ankle.  Increased pain.  While resting, raise your leg above the level of your heart. Pillows can be used for support. This reduces throbbing and helps healing.  Remove the immobilizer to bathe and sleep. SEEK MEDICAL CARE IF:  You have increasing pain or swelling in the knee, foot, or ankle.  You have problems caused by the knee immobilizer, or it breaks or needs replacement. MAKE SURE YOU:   Understand these instructions.  Will watch your condition.  Will get help right away if you are not doing well or get worse. Document Released: 04/07/2005 Document Revised: 08/22/2013 Document Reviewed: 11/29/2012 [facility] Patient Information 2015 [place], [place]. This information is not intended  to replace advice given to you by your health care provider. Make sure you discuss any questions you have with your health care provider.

## 2014-09-02 NOTE — ED Notes (Signed)
Pt became pale, nauseous, weak and hypotensive at triage.

## 2014-09-20 ENCOUNTER — Other Ambulatory Visit: Payer: Self-pay

## 2014-09-20 DIAGNOSIS — Z1231 Encounter for screening mammogram for malignant neoplasm of breast: Secondary | ICD-10-CM

## 2014-10-17 ENCOUNTER — Ambulatory Visit (INDEPENDENT_AMBULATORY_CARE_PROVIDER_SITE_OTHER): Payer: Medicare HMO | Admitting: Physician Assistant

## 2014-10-17 ENCOUNTER — Encounter: Payer: Self-pay | Admitting: Internal Medicine

## 2014-10-17 VITALS — BP 108/64 | HR 72 | Temp 97.0°F | Resp 16 | Ht 65.25 in | Wt 146.8 lb

## 2014-10-17 DIAGNOSIS — E782 Mixed hyperlipidemia: Secondary | ICD-10-CM

## 2014-10-17 DIAGNOSIS — R739 Hyperglycemia, unspecified: Secondary | ICD-10-CM

## 2014-10-17 DIAGNOSIS — R030 Elevated blood-pressure reading, without diagnosis of hypertension: Secondary | ICD-10-CM

## 2014-10-17 DIAGNOSIS — E559 Vitamin D deficiency, unspecified: Secondary | ICD-10-CM

## 2014-10-17 DIAGNOSIS — Z79899 Other long term (current) drug therapy: Secondary | ICD-10-CM

## 2014-10-17 LAB — CBC WITH DIFFERENTIAL/PLATELET
BASOS ABS: 0 10*3/uL (ref 0.0–0.1)
BASOS PCT: 0 % (ref 0–1)
EOS PCT: 2 % (ref 0–5)
Eosinophils Absolute: 0.1 10*3/uL (ref 0.0–0.7)
HEMATOCRIT: 38.3 % (ref 36.0–46.0)
Hemoglobin: 12.8 g/dL (ref 12.0–15.0)
LYMPHS PCT: 22 % (ref 12–46)
Lymphs Abs: 1.1 10*3/uL (ref 0.7–4.0)
MCH: 30.2 pg (ref 26.0–34.0)
MCHC: 33.4 g/dL (ref 30.0–36.0)
MCV: 90.3 fL (ref 78.0–100.0)
MONO ABS: 0.5 10*3/uL (ref 0.1–1.0)
MONOS PCT: 9 % (ref 3–12)
MPV: 11.4 fL (ref 8.6–12.4)
NEUTROS ABS: 3.4 10*3/uL (ref 1.7–7.7)
Neutrophils Relative %: 67 % (ref 43–77)
Platelets: 191 10*3/uL (ref 150–400)
RBC: 4.24 MIL/uL (ref 3.87–5.11)
RDW: 14.2 % (ref 11.5–15.5)
WBC: 5.1 10*3/uL (ref 4.0–10.5)

## 2014-10-17 LAB — HEMOGLOBIN A1C
Hgb A1c MFr Bld: 5.6 % (ref <5.7)
MEAN PLASMA GLUCOSE: 114 mg/dL (ref <117)

## 2014-10-17 MED ORDER — GEMFIBROZIL 600 MG PO TABS: ORAL_TABLET | ORAL | Status: DC

## 2014-10-17 NOTE — Patient Instructions (Signed)
Benefiber is good for constipation/diarrhea/irritable bowel syndrome, it helps with weight loss and can help lower your bad cholesterol. Please do 1-2 TBSP in the morning in water, coffee, or tea. It can take up to a month before you can see a difference with your bowel movements. It is cheapest from [employer], [employer], [employer].    Vitamin D goal is between 60-80  Please make sure that you are taking your Vitamin D as directed.   It is very important as a natural anti-inflammatory   helping hair, skin, and nails, as well as reducing stroke and heart attack risk.   It helps your bones and helps with mood.  It also decreases numerous cancer risks so please take it as directed.   Low Vit D is associated with a 200-300% higher risk for CANCER   and 200-300% higher risk for HEART   ATTACK  &  STROKE.    .....................................Marland Kitchen  It is also associated with higher death rate at younger ages,   autoimmune diseases like Rheumatoid arthritis, Lupus, Multiple Sclerosis.     Also many other serious conditions, like depression, Alzheimer's  Dementia, infertility, muscle aches, fatigue, fibromyalgia - just to name a few.  +++++++++++++++++++   Add ENTERIC COATED low dose 81 mg Aspirin daily OR can do every other day if you have easy bruising to protect your heart and head. As well as to reduce risk of Colon Cancer by 20 %, Skin Cancer by 26 % , Melanoma by 46% and Pancreatic cancer by 60%  .

## 2014-10-17 NOTE — Progress Notes (Signed)
Assessment and Plan:  1. Hypertension -Continue medication, monitor blood pressure at home. Continue DASH diet.  Reminder to go to the ER if any CP, SOB, nausea, dizziness, severe HA, changes vision/speech, left arm numbness and tingling and jaw pain.  2. Cholesterol -Continue diet and exercise. Check cholesterol since on lopid  3. Prediabetes  -Continue diet and exercise. Check A1C  4. Vitamin D Def - check level and continue medications.   5. S/p fall with fracture Getting DEXA next month, continue ortho follow up  Continue diet and meds as discussed. Further disposition pending results of labs. Over 30 minutes of exam, counseling, chart review, and critical decision making was performed  Future Appointments Date Time Provider [place]  10/31/2014 1:30 PM [facility] T[facility]     HPI 74 y.o. female  presents for 3 month follow up on hypertension, cholesterol, prediabetes, and vitamin D deficiency.   Her blood pressure has been controlled at home, today their BP is BP: 108/64 mmHg  Patient is s/p fall in May that resulted in a patella fracture, currently in knee imobilizer and following up with ortho, Dr. Doran Durand,  saw them yesterday and will stay in the brace for another 3 weeks. Getting MGM and DEXA next month.   She does not workout. She denies chest pain, shortness of breath, dizziness.  She is not on cholesterol medication, she is on lopid due to intolerance to statins and denies myalgias. Her cholesterol is not at goal. The cholesterol last visit was:   Lab Results  Component Value Date   CHOL 193 06/30/2014   HDL 51 06/30/2014   LDLCALC 129* 06/30/2014   TRIG 63 06/30/2014   CHOLHDL 3.8 06/30/2014    She has been working on diet and exercise for prediabetes, and denies paresthesia of the feet, polydipsia, polyuria and visual disturbances. Last A1C in the office was:  Lab Results  Component Value Date   HGBA1C 5.8* 06/30/2014   Patient is on  Vitamin D supplement.   Lab Results  Component Value Date   VD25OH 39 06/30/2014      Current Medications:  Current Outpatient Prescriptions on File Prior to Visit  Medication Sig Dispense Refill  . calcipotriene (DOVONOX) 0.005 % ointment Apply topically 2 (two) times daily. (Patient taking differently: Apply 1 application topically 2 (two) times daily as needed (psoriasis). ) 60 g 1  . Calcium Carbonate-Vitamin D (CALCIUM + D PO) Take 1 tablet by mouth daily.    . Cholecalciferol (VITAMIN D3) 5000 UNITS TABS Take 5,000-10,000 Units by mouth daily. Take 2 capsules (10,000 units) on Tuesday, Thursday, Saturday, take 1 capsule (5,000 units) on Sunday, Monday, Wednesday, Friday    . gemfibrozil (LOPID) 600 MG tablet Take 1 tablet 2 x day with meal for cholesterol (Patient taking differently: Take 600 mg by mouth 2 (two) times daily before a meal. for cholesterol) 60 tablet 3  . MAGNESIUM PO Take 1 tablet by mouth daily.     . Multiple Vitamin (MULTIVITAMIN WITH MINERALS) TABS tablet Take 1 tablet by mouth daily.     No current facility-administered medications on file prior to visit.   Medical History:  Past Medical History  Diagnosis Date  . Hyperlipidemia   . IBS (irritable bowel syndrome)   . Psoriasis   . Osteopenia   . History of ITP    Allergies:  Allergies  Allergen Reactions  . Atorvastatin Other (See Comments)    myalgia  . Fosamax [Alendronate Sodium] Other (  See Comments)    Myalgia    Review of Systems:  Review of Systems  Constitutional: Negative.   HENT: Negative.   Eyes: Negative.   Respiratory: Negative.   Cardiovascular: Negative.   Gastrointestinal: Negative.   Genitourinary: Negative.   Musculoskeletal: Positive for joint pain (leg getting better) and falls. Negative for myalgias, back pain and neck pain.  Skin: Negative.   Neurological: Negative.   Endo/Heme/Allergies: Negative.   Psychiatric/Behavioral: Negative.     Family history- Review and  unchanged Social history- Review and unchanged Physical Exam: BP 108/64 mmHg  Pulse 72  Temp(Src) 97 F (36.1 C)  Resp 16  Ht 5' 5.25" (1.657 m)  Wt 146 lb 12.8 oz (66.588 kg)  BMI 24.25 kg/m2 Wt Readings from Last 3 Encounters:  10/17/14 146 lb 12.8 oz (66.588 kg)  06/30/14 147 lb (66.679 kg)  01/25/14 140 lb 3.2 oz (63.594 kg)   General Appearance: Well nourished, in no apparent distress. Eyes: PERRLA, EOMs, conjunctiva no swelling or erythema Sinuses: No Frontal/maxillary tenderness ENT/Mouth: Ext aud canals clear, TMs without erythema, bulging. No erythema, swelling, or exudate on post pharynx.  Tonsils not swollen or erythematous. Hearing normal.  Neck: Supple, thyroid normal.  Respiratory: Respiratory effort normal, BS equal bilaterally without rales, rhonchi, wheezing or stridor.  Cardio: RRR with no MRGs. Brisk peripheral pulses without edema.  Abdomen: Soft, + BS,  Non tender, no guarding, rebound, hernias, masses. Lymphatics: Non tender without lymphadenopathy.  Musculoskeletal: Full ROM, 5/5 strength, abnormal gait due to brace Skin: Warm, dry without rashes, lesions, ecchymosis.  Neuro: Cranial nerves intact. Normal muscle tone, no cerebellar symptoms. Psych: Awake and oriented X 3, normal affect, Insight and Judgment appropriate.    Vicie Mutters, PA-C 12:21 PM [facility]

## 2014-10-18 LAB — BASIC METABOLIC PANEL WITH GFR
BUN: 19 mg/dL (ref 6–23)
CALCIUM: 9.3 mg/dL (ref 8.4–10.5)
CO2: 27 mEq/L (ref 19–32)
Chloride: 101 mEq/L (ref 96–112)
Creat: 0.78 mg/dL (ref 0.50–1.10)
GFR, EST AFRICAN AMERICAN: 86 mL/min
GFR, EST NON AFRICAN AMERICAN: 75 mL/min
Glucose, Bld: 79 mg/dL (ref 70–99)
Potassium: 4.3 mEq/L (ref 3.5–5.3)
Sodium: 142 mEq/L (ref 135–145)

## 2014-10-18 LAB — INSULIN, FASTING: Insulin fasting, serum: 3.5 u[IU]/mL (ref 2.0–19.6)

## 2014-10-18 LAB — LIPID PANEL
CHOLESTEROL: 181 mg/dL (ref 0–200)
HDL: 64 mg/dL (ref >=46)
LDL Cholesterol: 107 mg/dL — ABNORMAL HIGH (ref 0–99)
TRIGLYCERIDES: 52 mg/dL (ref <150)
Total CHOL/HDL Ratio: 2.8 Ratio
VLDL: 10 mg/dL (ref 0–40)

## 2014-10-18 LAB — HEPATIC FUNCTION PANEL
ALBUMIN: 4.2 g/dL (ref 3.5–5.2)
ALT: 25 U/L (ref 0–35)
AST: 29 U/L (ref 0–37)
Alkaline Phosphatase: 78 U/L (ref 39–117)
BILIRUBIN INDIRECT: 0.4 mg/dL (ref 0.2–1.2)
Bilirubin, Direct: 0.2 mg/dL (ref 0.0–0.3)
TOTAL PROTEIN: 6.8 g/dL (ref 6.0–8.3)
Total Bilirubin: 0.6 mg/dL (ref 0.2–1.2)

## 2014-10-18 LAB — VITAMIN D 25 HYDROXY (VIT D DEFICIENCY, FRACTURES): VIT D 25 HYDROXY: 65 ng/mL (ref 30–100)

## 2014-10-18 LAB — TSH: TSH: 0.641 u[IU]/mL (ref 0.350–4.500)

## 2014-10-18 LAB — MAGNESIUM: Magnesium: 1.9 mg/dL (ref 1.5–2.5)

## 2014-10-31 ENCOUNTER — Ambulatory Visit
Admission: RE | Admit: 2014-10-31 | Discharge: 2014-10-31 | Disposition: A | Discharge status: Home / Self Care | Payer: Medicare HMO | Source: Ambulatory Visit

## 2014-10-31 DIAGNOSIS — Z1231 Encounter for screening mammogram for malignant neoplasm of breast: Secondary | ICD-10-CM

## 2014-11-02 ENCOUNTER — Other Ambulatory Visit: Payer: Self-pay | Admitting: Internal Medicine

## 2014-11-02 DIAGNOSIS — R928 Other abnormal and inconclusive findings on diagnostic imaging of breast: Secondary | ICD-10-CM

## 2014-11-06 ENCOUNTER — Ambulatory Visit: Admission: RE | Admit: 2014-11-06 | Payer: Medicare HMO | Source: Ambulatory Visit

## 2014-11-06 ENCOUNTER — Ambulatory Visit
Admission: RE | Admit: 2014-11-06 | Discharge: 2014-11-06 | Disposition: A | Discharge status: Home / Self Care | Payer: Medicare HMO | Source: Ambulatory Visit | Attending: Internal Medicine | Admitting: Internal Medicine

## 2014-11-06 DIAGNOSIS — R928 Other abnormal and inconclusive findings on diagnostic imaging of breast: Secondary | ICD-10-CM

## 2014-12-14 ENCOUNTER — Other Ambulatory Visit: Payer: Self-pay | Admitting: Emergency Medicine

## 2014-12-14 DIAGNOSIS — E2839 Other primary ovarian failure: Secondary | ICD-10-CM

## 2014-12-21 ENCOUNTER — Ambulatory Visit
Admission: RE | Admit: 2014-12-21 | Discharge: 2014-12-21 | Disposition: A | Discharge status: Home / Self Care | Payer: Medicare HMO | Source: Ambulatory Visit | Attending: Emergency Medicine | Admitting: Emergency Medicine

## 2014-12-21 DIAGNOSIS — E2839 Other primary ovarian failure: Secondary | ICD-10-CM

## 2015-01-01 ENCOUNTER — Other Ambulatory Visit: Payer: Self-pay | Admitting: Internal Medicine

## 2015-03-29 ENCOUNTER — Encounter: Payer: Self-pay | Admitting: Internal Medicine

## 2015-03-29 ENCOUNTER — Ambulatory Visit (INDEPENDENT_AMBULATORY_CARE_PROVIDER_SITE_OTHER): Payer: Medicare HMO | Admitting: Internal Medicine

## 2015-03-29 VITALS — BP 122/84 | HR 56 | Temp 97.3°F | Resp 16 | Ht 65.25 in | Wt 140.2 lb

## 2015-03-29 DIAGNOSIS — E559 Vitamin D deficiency, unspecified: Secondary | ICD-10-CM

## 2015-03-29 DIAGNOSIS — Z79899 Other long term (current) drug therapy: Secondary | ICD-10-CM

## 2015-03-29 DIAGNOSIS — R7309 Other abnormal glucose: Secondary | ICD-10-CM | POA: Diagnosis not present

## 2015-03-29 DIAGNOSIS — Z1389 Encounter for screening for other disorder: Secondary | ICD-10-CM

## 2015-03-29 DIAGNOSIS — E782 Mixed hyperlipidemia: Secondary | ICD-10-CM

## 2015-03-29 DIAGNOSIS — Z789 Other specified health status: Secondary | ICD-10-CM | POA: Diagnosis not present

## 2015-03-29 DIAGNOSIS — R030 Elevated blood-pressure reading, without diagnosis of hypertension: Secondary | ICD-10-CM | POA: Diagnosis not present

## 2015-03-29 DIAGNOSIS — Z1331 Encounter for screening for depression: Secondary | ICD-10-CM

## 2015-03-29 DIAGNOSIS — Z6823 Body mass index (BMI) 23.0-23.9, adult: Secondary | ICD-10-CM | POA: Diagnosis not present

## 2015-03-29 DIAGNOSIS — R7303 Prediabetes: Secondary | ICD-10-CM

## 2015-03-29 DIAGNOSIS — Z9181 History of falling: Secondary | ICD-10-CM

## 2015-03-29 LAB — CBC WITH DIFFERENTIAL/PLATELET
BASOS PCT: 1 % (ref 0–1)
Basophils Absolute: 0 10*3/uL (ref 0.0–0.1)
EOS ABS: 0.1 10*3/uL (ref 0.0–0.7)
Eosinophils Relative: 2 % (ref 0–5)
HCT: 36.5 % (ref 36.0–46.0)
HEMOGLOBIN: 12.5 g/dL (ref 12.0–15.0)
Lymphocytes Relative: 31 % (ref 12–46)
Lymphs Abs: 1.4 10*3/uL (ref 0.7–4.0)
MCH: 30.9 pg (ref 26.0–34.0)
MCHC: 34.2 g/dL (ref 30.0–36.0)
MCV: 90.1 fL (ref 78.0–100.0)
MPV: 11.3 fL (ref 8.6–12.4)
Monocytes Absolute: 0.3 10*3/uL (ref 0.1–1.0)
Monocytes Relative: 7 % (ref 3–12)
NEUTROS ABS: 2.7 10*3/uL (ref 1.7–7.7)
Neutrophils Relative %: 59 % (ref 43–77)
PLATELETS: 207 10*3/uL (ref 150–400)
RBC: 4.05 MIL/uL (ref 3.87–5.11)
RDW: 14.2 % (ref 11.5–15.5)
WBC: 4.6 10*3/uL (ref 4.0–10.5)

## 2015-03-29 LAB — HEPATIC FUNCTION PANEL
ALK PHOS: 69 U/L (ref 33–130)
ALT: 14 U/L (ref 6–29)
AST: 20 U/L (ref 10–35)
Albumin: 4.1 g/dL (ref 3.6–5.1)
BILIRUBIN INDIRECT: 0.6 mg/dL (ref 0.2–1.2)
Bilirubin, Direct: 0.1 mg/dL (ref <=0.2)
Total Bilirubin: 0.7 mg/dL (ref 0.2–1.2)
Total Protein: 6.8 g/dL (ref 6.1–8.1)

## 2015-03-29 LAB — LIPID PANEL
CHOL/HDL RATIO: 2.8 ratio (ref <=5.0)
CHOLESTEROL: 179 mg/dL (ref 125–200)
HDL: 65 mg/dL (ref >=46)
LDL Cholesterol: 107 mg/dL (ref <130)
Triglycerides: 36 mg/dL (ref <150)
VLDL: 7 mg/dL (ref <30)

## 2015-03-29 LAB — BASIC METABOLIC PANEL WITH GFR
BUN: 15 mg/dL (ref 7–25)
CALCIUM: 9.6 mg/dL (ref 8.6–10.4)
CHLORIDE: 105 mmol/L (ref 98–110)
CO2: 29 mmol/L (ref 20–31)
Creat: 0.69 mg/dL (ref 0.60–0.93)
GFR, EST NON AFRICAN AMERICAN: 85 mL/min (ref >=60)
GFR, Est African American: >89 mL/min (ref >=60)
Glucose, Bld: 89 mg/dL (ref 65–99)
Potassium: 4.3 mmol/L (ref 3.5–5.3)
Sodium: 141 mmol/L (ref 135–146)

## 2015-03-29 LAB — MAGNESIUM: MAGNESIUM: 2 mg/dL (ref 1.5–2.5)

## 2015-03-29 LAB — TSH: TSH: 0.975 u[IU]/mL (ref 0.350–4.500)

## 2015-03-29 NOTE — Progress Notes (Signed)
Patient ID: Madison Gonzalez, female   DOB: 1940-12-02, 74 y.o.   MRN: [id number]   This very nice 74 y.o. [facility] presents for  follow up with Hypertension, Hyperlipidemia, Pre-Diabetes and Vitamin D Deficiency.    Patient has hx/o elevated BP's in the past and is monitored expectantly  & BP has been controlled at home. Today's BP: 122/84 mmHg. Patient has had no complaints of any cardiac type chest pain, palpitations, dyspnea/orthopnea/PND, dizziness, claudication, or dependent edema.   Hyperlipidemia is controlled with diet & meds. Patient denies myalgias or other med SE's. Last Lipids were 10/17/2014: Cholesterol 181; HDL 64; LDL Cholesterol 107*; Triglycerides 52 on    Also, the patient has history of PreDiabetes and has had no symptoms of reactive hypoglycemia, diabetic polys, paresthesias or visual blurring.  Last A1c was at goal with A1c 5.6% on 10/17/2014.   Further, the patient also has history of Vitamin D Deficiency and supplements vitamin D without any suspected side-effects. Last vit D was 65 on 10/17/2014.  Medication Sig  . calcipotriene (DOVONOX) 0.005 % oint Apply 1 application topically 2  times daily as needed (psoriasis)  . Calcium -Vitamin D  Take 1 tablet by mouth daily.  Marland Kitchen VITAMIN D 5000 UNITS TABS Take 2 cap on TTSat, take 1 cap on MWFSun  . gemfibrozil (LOPID) 600 MG tablet Take 1 tablet 2 x day with meal for cholesterol  . MAGNESIUM PO Take 1 tablet by mouth daily.   . MULTIVITAMIN WITH MINERALS Take 1 tablet by mouth daily.   Allergies  Allergen Reactions  . Atorvastatin Other (See Comments)    myalgia  . Fosamax [Alendronate Sodium] Other (See Comments)    Myalgia   PMHx:   Past Medical History  Diagnosis Date  . Hyperlipidemia   . IBS (irritable bowel syndrome)   . Psoriasis   . Osteopenia   . History of ITP    Immunization History  Administered Date(s) Administered  . Influenza, High Dose Seasonal PF 01/25/2014  . Influenza-Unspecified 12/21/2014  .  PPD Test 03/14/2013  . Pneumococcal Polysaccharide-23 02/08/2009, 06/30/2014  . Td 02/08/2009  . Zoster 03/29/2009   Past Surgical History  Procedure Laterality Date  . Breast surgery Right     Biospy, benign  . Abdominal hysterectomy    . Eye surgery Bilateral     Cataracts  . Eye surgery      Lense implants  . Appendectomy    . Tubal ligation     FHx:    Reviewed / unchanged  SHx:    Reviewed / unchanged  Systems Review:  Constitutional: Denies fever, chills, wt changes, headaches, insomnia, fatigue, night sweats, change in appetite. Eyes: Denies redness, blurred vision, diplopia, discharge, itchy, watery eyes.  ENT: Denies discharge, congestion, post nasal drip, epistaxis, sore throat, earache, hearing loss, dental pain, tinnitus, vertigo, sinus pain, snoring.  CV: Denies chest pain, palpitations, irregular heartbeat, syncope, dyspnea, diaphoresis, orthopnea, PND, claudication or edema. Respiratory: denies cough, dyspnea, DOE, pleurisy, hoarseness, laryngitis, wheezing.  Gastrointestinal: Denies dysphagia, odynophagia, heartburn, reflux, water brash, abdominal pain or cramps, nausea, vomiting, bloating, diarrhea, constipation, hematemesis, melena, hematochezia  or hemorrhoids. Genitourinary: Denies dysuria, frequency, urgency, nocturia, hesitancy, discharge, hematuria or flank pain. Musculoskeletal: Denies arthralgias, myalgias, stiffness, jt. swelling, pain, limping or strain/sprain.  Denies falls. Skin: Denies pruritus, rash, hives, warts, acne, eczema or change in skin lesion(s). Neuro: No weakness, tremor, incoordination, spasms, paresthesia or pain. Psychiatric: Denies confusion, memory loss or sensory loss. Denies depression or mood  changes. Endo: Denies change in weight, skin or hair change.  Heme/Lymph: No excessive bleeding, bruising or enlarged lymph nodes.  Physical Exam  BP 122/84 mmHg  Pulse 56  Temp(Src) 97.3 F (36.3 C)  Resp 16  Ht 5' 5.25" (1.657 m)  Wt  140 lb 3.2 oz (63.594 kg)  BMI 23.16 kg/m2  Appears well nourished and in no distress. Eyes: PERRLA, EOMs, conjunctiva no swelling or erythema. Sinuses: No frontal/maxillary tenderness ENT/Mouth: EAC's clear, TM's nl w/o erythema, bulging. Nares clear w/o erythema, swelling, exudates. Oropharynx clear without erythema or exudates. Oral hygiene is good. Tongue normal, non obstructing. Hearing intact.  Neck: Supple. Thyroid nl. Car 2+/2+ without bruits, nodes or JVD. Chest: Respirations nl with BS clear & equal w/o rales, rhonchi, wheezing or stridor.  Cor: Heart sounds normal w/ regular rate and rhythm without sig. murmurs, gallops, clicks, or rubs. Peripheral pulses normal and equal  without edema.  Abdomen: Soft & bowel sounds normal. Non-tender w/o guarding, rebound, hernias, masses, or organomegaly.  Lymphatics: Unremarkable.  Musculoskeletal: Full ROM all peripheral extremities, joint stability, 5/5 strength, and normal gait.  Skin: Warm, dry without exposed rashes, lesions or ecchymosis apparent.  Neuro: Cranial nerves intact, reflexes equal bilaterally. Sensory-motor testing grossly intact. Tendon reflexes grossly intact.  Pysch: Alert & oriented x 3.  Insight and judgement nl & appropriate. No ideations.  Assessment and Plan:  1. Elevated BP w/o Dx HTN  - TSH  2. Hyperlipidemia  - Lipid panel - TSH  3. Prediabetes  - Hemoglobin A1c - Insulin, random  4. Vitamin D deficiency  - VITAMIN D 25 Hydroxy   5. Other abnormal glucose  - Hemoglobin A1c - Insulin, random  6. Body mass index (BMI) of 23.0-23.9 in adult   7. Medication management  - CBC with Differential/Platelet - BASIC METABOLIC PANEL WITH GFR - Hepatic function panel - Magnesium   Recommended regular exercise, BP monitoring, weight control, and discussed med and SE's. Recommended labs to assess and monitor clinical status. Further disposition pending results of labs. Over 30 minutes of exam,  counseling, chart review was performed ]

## 2015-03-29 NOTE — Patient Instructions (Signed)

## 2015-03-30 LAB — HEMOGLOBIN A1C
Hgb A1c MFr Bld: 5.9 % — ABNORMAL HIGH (ref <5.7)
Mean Plasma Glucose: 123 mg/dL — ABNORMAL HIGH (ref <117)

## 2015-03-30 LAB — VITAMIN D 25 HYDROXY (VIT D DEFICIENCY, FRACTURES): VIT D 25 HYDROXY: 71 ng/mL (ref 30–100)

## 2015-03-30 LAB — INSULIN, RANDOM: Insulin: 1.8 u[IU]/mL — ABNORMAL LOW (ref 2.0–19.6)

## 2015-03-31 DIAGNOSIS — Z6822 Body mass index (BMI) 22.0-22.9, adult: Secondary | ICD-10-CM | POA: Insufficient documentation

## 2015-03-31 DIAGNOSIS — Z6823 Body mass index (BMI) 23.0-23.9, adult: Secondary | ICD-10-CM | POA: Insufficient documentation

## 2015-04-24 DIAGNOSIS — R0981 Nasal congestion: Secondary | ICD-10-CM | POA: Diagnosis not present

## 2015-04-24 DIAGNOSIS — H9011 Conductive hearing loss, unilateral, right ear, with unrestricted hearing on the contralateral side: Secondary | ICD-10-CM | POA: Diagnosis not present

## 2015-04-24 DIAGNOSIS — H6121 Impacted cerumen, right ear: Secondary | ICD-10-CM | POA: Diagnosis not present

## 2015-07-02 ENCOUNTER — Encounter: Payer: Self-pay | Admitting: Emergency Medicine

## 2015-07-09 ENCOUNTER — Encounter: Payer: Self-pay | Admitting: Internal Medicine

## 2015-07-09 ENCOUNTER — Ambulatory Visit (INDEPENDENT_AMBULATORY_CARE_PROVIDER_SITE_OTHER): Payer: Medicare HMO | Admitting: Internal Medicine

## 2015-07-09 VITALS — BP 136/80 | HR 62 | Temp 98.0°F | Resp 16 | Ht 65.25 in | Wt 141.0 lb

## 2015-07-09 DIAGNOSIS — Z79899 Other long term (current) drug therapy: Secondary | ICD-10-CM | POA: Diagnosis not present

## 2015-07-09 DIAGNOSIS — L409 Psoriasis, unspecified: Secondary | ICD-10-CM

## 2015-07-09 DIAGNOSIS — R030 Elevated blood-pressure reading, without diagnosis of hypertension: Secondary | ICD-10-CM | POA: Diagnosis not present

## 2015-07-09 DIAGNOSIS — R7303 Prediabetes: Secondary | ICD-10-CM | POA: Diagnosis not present

## 2015-07-09 DIAGNOSIS — Z0001 Encounter for general adult medical examination with abnormal findings: Secondary | ICD-10-CM

## 2015-07-09 DIAGNOSIS — Z0000 Encounter for general adult medical examination without abnormal findings: Secondary | ICD-10-CM

## 2015-07-09 DIAGNOSIS — Z862 Personal history of diseases of the blood and blood-forming organs and certain disorders involving the immune mechanism: Secondary | ICD-10-CM

## 2015-07-09 DIAGNOSIS — Z6823 Body mass index (BMI) 23.0-23.9, adult: Secondary | ICD-10-CM

## 2015-07-09 DIAGNOSIS — E559 Vitamin D deficiency, unspecified: Secondary | ICD-10-CM

## 2015-07-09 DIAGNOSIS — R6889 Other general symptoms and signs: Secondary | ICD-10-CM

## 2015-07-09 DIAGNOSIS — K589 Irritable bowel syndrome without diarrhea: Secondary | ICD-10-CM | POA: Diagnosis not present

## 2015-07-09 DIAGNOSIS — E782 Mixed hyperlipidemia: Secondary | ICD-10-CM | POA: Diagnosis not present

## 2015-07-09 DIAGNOSIS — M8580 Other specified disorders of bone density and structure, unspecified site: Secondary | ICD-10-CM | POA: Diagnosis not present

## 2015-07-09 LAB — CBC WITH DIFFERENTIAL/PLATELET
BASOS PCT: 1 % (ref 0–1)
Basophils Absolute: 0 10*3/uL (ref 0.0–0.1)
EOS ABS: 0.1 10*3/uL (ref 0.0–0.7)
Eosinophils Relative: 3 % (ref 0–5)
HCT: 37.5 % (ref 36.0–46.0)
HEMOGLOBIN: 12.5 g/dL (ref 12.0–15.0)
Lymphocytes Relative: 28 % (ref 12–46)
Lymphs Abs: 1.4 10*3/uL (ref 0.7–4.0)
MCH: 30.2 pg (ref 26.0–34.0)
MCHC: 33.3 g/dL (ref 30.0–36.0)
MCV: 90.6 fL (ref 78.0–100.0)
MPV: 11.3 fL (ref 8.6–12.4)
Monocytes Absolute: 0.4 10*3/uL (ref 0.1–1.0)
Monocytes Relative: 8 % (ref 3–12)
NEUTROS ABS: 2.9 10*3/uL (ref 1.7–7.7)
NEUTROS PCT: 60 % (ref 43–77)
PLATELETS: 203 10*3/uL (ref 150–400)
RBC: 4.14 MIL/uL (ref 3.87–5.11)
RDW: 13.7 % (ref 11.5–15.5)
WBC: 4.9 10*3/uL (ref 4.0–10.5)

## 2015-07-09 LAB — LIPID PANEL
CHOL/HDL RATIO: 3.5 ratio (ref <=5.0)
CHOLESTEROL: 204 mg/dL — AB (ref 125–200)
HDL: 58 mg/dL (ref >=46)
LDL CALC: 137 mg/dL — AB (ref <130)
TRIGLYCERIDES: 43 mg/dL (ref <150)
VLDL: 9 mg/dL (ref <30)

## 2015-07-09 LAB — BASIC METABOLIC PANEL WITH GFR
BUN: 13 mg/dL (ref 7–25)
CHLORIDE: 103 mmol/L (ref 98–110)
CO2: 29 mmol/L (ref 20–31)
Calcium: 9.2 mg/dL (ref 8.6–10.4)
Creat: 0.71 mg/dL (ref 0.60–0.93)
GFR, EST NON AFRICAN AMERICAN: 84 mL/min (ref >=60)
GFR, Est African American: >89 mL/min (ref >=60)
Glucose, Bld: 93 mg/dL (ref 65–99)
Potassium: 4.3 mmol/L (ref 3.5–5.3)
SODIUM: 141 mmol/L (ref 135–146)

## 2015-07-09 LAB — HEPATIC FUNCTION PANEL
ALT: 16 U/L (ref 6–29)
AST: 20 U/L (ref 10–35)
Albumin: 3.9 g/dL (ref 3.6–5.1)
Alkaline Phosphatase: 80 U/L (ref 33–130)
BILIRUBIN DIRECT: 0.2 mg/dL (ref <=0.2)
BILIRUBIN INDIRECT: 0.5 mg/dL (ref 0.2–1.2)
BILIRUBIN TOTAL: 0.7 mg/dL (ref 0.2–1.2)
Total Protein: 6.4 g/dL (ref 6.1–8.1)

## 2015-07-09 NOTE — Progress Notes (Signed)
Patient ID: Madison Gonzalez, female   DOB: 08-30-1940, 75 y.o.   MRN: [id number]  MEDICARE ANNUAL WELLNESS VISIT AND FOLLOW UP  Assessment:    1. Elevated BP w/o Dx HTN -on low dose ziac -monitor at home -dash diet -exercise -call office if 150/90 or greater.  2. Hyperlipidemia -cont diet and exercise - Lipid panel  3. Prediabetes -cont diet and exercise  4. Medication management  - CBC with Differential/Platelet - BASIC METABOLIC PANEL WITH GFR - Hepatic function panel  5. Vitamin D deficiency -cont supplement  6. History of ITP -check CBC  7. Osteopenia -DEXA ordered  8. Psoriasis -currently well controlled  9. Body mass index (BMI) of 23.0-23.9 in adult -cont diet and exercise  10. IBS (irritable bowel syndrome) -currently without flare  11.  Medicare wellness -due next year.         Over 30 minutes of exam, counseling, chart review, and critical decision making was performed  Plan:   During the course of the visit the patient was educated and counseled about appropriate screening and preventive services including:    Pneumococcal vaccine   Influenza vaccine  Td vaccine  Prevnar 13  Screening electrocardiogram  Screening mammography  Bone densitometry screening  Colorectal cancer screening  Diabetes screening  Glaucoma screening  Nutrition counseling   Advanced directives: given info/requested copies  Conditions/risks identified: Diabetes is not at goal, ACE/ARB therapy: No, Reason not on Ace Inhibitor/ARB therapy:  not indicated Urinary Incontinence is not an issue: discussed non pharmacology and pharmacology options.  Fall risk: high- discussed PT, home fall assessment, medications. This is secondary to mechanical fall May 2016 with Patellar Fracture.     Subjective:   Madison Gonzalez is a 75 y.o. female who presents for Medicare Annual Wellness Visit and 3 month follow up on hypertension, prediabetes,  hyperlipidemia, vitamin D def.  Date of last medicare wellness visit is 08/2014.   Her blood pressure has been controlled at home, today their BP is BP: 136/80 mmHg She does not workout. She denies chest pain, shortness of breath, dizziness.  She is on cholesterol medication and denies myalgias. Her cholesterol is at goal. The cholesterol last visit was:   Lab Results  Component Value Date   CHOL 179 03/29/2015   HDL 65 03/29/2015   LDLCALC 107 03/29/2015   TRIG 36 03/29/2015   CHOLHDL 2.8 03/29/2015   She has been working on diet and exercise for prediabetes, and denies foot ulcerations, hyperglycemia, hypoglycemia , increased appetite, nausea, paresthesia of the feet, polydipsia, polyuria, visual disturbances, vomiting and weight loss. Last A1C in the office was:  Lab Results  Component Value Date   HGBA1C 5.9* 03/29/2015   Last GFR NonAA   Lab Results  Component Value Date   GFRNONAA 85 03/29/2015   AA  Lab Results  Component Value Date   GFRAA >89 03/29/2015   Patient is on Vitamin D supplement. Lab Results  Component Value Date   VD25OH 71 03/29/2015      Medication Review Current Outpatient Prescriptions on File Prior to Visit  Medication Sig Dispense Refill  . calcipotriene (DOVONOX) 0.005 % ointment Apply topically 2 (two) times daily. (Patient taking differently: Apply 1 application topically 2 (two) times daily as needed (psoriasis). ) 60 g 1  . Calcium Carbonate-Vitamin D (CALCIUM + D PO) Take 1 tablet by mouth daily.    . Cholecalciferol (VITAMIN D3) 5000 UNITS TABS Take 5,000-10,000 Units by mouth daily. Take 2 capsules (  10,000 units) on Tuesday, Thursday, Saturday, take 1 capsule (5,000 units) on Sunday, Monday, Wednesday, Friday    . gemfibrozil (LOPID) 600 MG tablet Take 1 tablet 2 x day with meal for cholesterol 120 tablet 3  . MAGNESIUM PO Take 1 tablet by mouth daily.     . Multiple Vitamin (MULTIVITAMIN WITH MINERALS) TABS tablet Take 1 tablet by mouth  daily.     No current facility-administered medications on file prior to visit.    Current Problems (verified) Patient Active Problem List   Diagnosis Date Noted  . Body mass index (BMI) of 23.0-23.9 in adult 03/31/2015  . Other abnormal glucose 03/29/2015  . Medication management 01/25/2014  . Elevated BP w/o Dx HTN 06/29/2013  . Hyperlipidemia 06/29/2013  . Vitamin D deficiency 06/29/2013  . Prediabetes 06/29/2013  . IBS (irritable bowel syndrome)   . Psoriasis   . Osteopenia   . History of ITP     Screening Tests Immunization History  Administered Date(s) Administered  . Influenza, High Dose Seasonal PF 01/25/2014  . Influenza-Unspecified 12/21/2014  . PPD Test 03/14/2013  . Pneumococcal Polysaccharide-23 02/08/2009, 06/30/2014  . Td 02/08/2009  . Zoster 03/29/2009    Preventative care: Last colonoscopy: 2014 Last mammogram: 2016 DEXA:2016  Prior vaccinations: TD or Tdap: 2010  Influenza:  2016  Pneumococcal: 2016 Prevnar13:  Shingles/Zostavax: 2016  Names of Other Physician/Practitioners you currently use: 1. [facility]- here for primary care 2. Dr. Kathrin Penner, eye doctor, last visit 2017 3. [facility], dentist, last visit 2016 Patient Care Team: Unk Pinto, MD as PCP - General (Internal Medicine) Shon Hough, MD as Consulting Physician (Ophthalmology) Allyn Kenner, MD (Dermatology)  Past Surgical History  Procedure Laterality Date  . Breast surgery Right     Biospy, benign  . Abdominal hysterectomy    . Eye surgery Bilateral     Cataracts  . Eye surgery      Lense implants  . Appendectomy    . Tubal ligation     Family History  Problem Relation Age of Onset  . AAA (abdominal aortic aneurysm) Mother   . Hypertension Mother   . Hypertension Father   . Heart disease Father   . Hyperlipidemia Father   . Cancer Maternal Aunt     uterine   Social History  Substance Use Topics  .  Smoking status: Never Smoker   . Smokeless tobacco: None  . Alcohol Use: Yes     Comment: occasionl    MEDICARE WELLNESS OBJECTIVES: Tobacco use: She does not smoke.  Patient is not a former smoker. If yes, counseling given Alcohol Current alcohol use: social drinker Osteoporosis: postmenopausal estrogen deficiency, History of fracture in the past year: yes Fall risk: High Risk Hearing: normal Visual acuity: normal,  does perform annual eye exam Diet: well balanced Physical activity: Current Exercise Habits: The patient does not participate in regular exercise at present Cardiac risk factors: Cardiac Risk Factors include: advanced age (>24men, >4 women);dyslipidemia;family history of premature cardiovascular disease;sedentary lifestyle Depression/mood screen:   Depression screen [facility] 2/9 07/09/2015  Decreased Interest 0  Down, Depressed, Hopeless 0  PHQ - 2 Score 0    ADLs:  In your present state of health, do you have any difficulty performing the following activities: 07/09/2015 03/31/2015  Hearing? N N  Vision? N N  Difficulty concentrating or making decisions? N N  Walking or climbing stairs? N N  Dressing or bathing? N N  Doing errands, shopping? N N  Preparing Food and eating ? N -  Using the Toilet? N -  In the past six months, have you accidently leaked urine? N -  Do you have problems with loss of bowel control? N -  Managing your Medications? N -  Managing your Finances? N -  Housekeeping or managing your Housekeeping? N -     Cognitive Testing  Alert? Yes  Normal Appearance?Yes  Oriented to person? Yes  Place? Yes   Time? Yes  Recall of three objects?  Yes  Can perform simple calculations? Yes  Displays appropriate judgment?Yes  Can read the correct time from a watch face?Yes  EOL planning: Does patient have an advance directive?: Yes Type of Advance Directive: Living will, Healthcare Power of Attorney Does patient want to make changes to advanced  directive?: No - Patient declined Copy of advanced directive(s) in chart?: No - copy requested   Objective:   Today's Vitals   07/09/15 0927  BP: 136/80  Pulse: 62  Temp: 98 F (36.7 C)  TempSrc: Temporal  Resp: 16  Height: 5' 5.25" (1.657 m)  Weight: 141 lb (63.957 kg)   Body mass index is 23.29 kg/(m^2).  General appearance: alert, no distress, WD/WN,  female HEENT: normocephalic, sclerae anicteric, TMs pearly, nares patent, no discharge or erythema, pharynx normal Oral cavity: MMM, no lesions Neck: supple, no lymphadenopathy, no thyromegaly, no masses Heart: RRR, normal S1, S2, no murmurs Lungs: CTA bilaterally, no wheezes, rhonchi, or rales Abdomen: +bs, soft, non tender, non distended, no masses, no hepatomegaly, no splenomegaly Musculoskeletal: nontender, no swelling, no obvious deformity Extremities: no edema, no cyanosis, no clubbing Pulses: 2+ symmetric, upper and lower extremities, normal cap refill Neurological: alert, oriented x 3, CN2-12 intact, strength normal upper extremities and lower extremities, sensation normal throughout, DTRs 2+ throughout, no cerebellar signs, gait normal Psychiatric: normal affect, behavior normal, pleasant  Breast: defer Gyn: defer Rectal: defer   Medicare Attestation I have personally reviewed: The patient's medical and social history Their use of alcohol, tobacco or illicit drugs Their current medications and supplements The patient's functional ability including ADLs,fall risks, home safety risks, cognitive, and hearing and visual impairment Diet and physical activities Evidence for depression or mood disorders  The patient's weight, height, BMI, and visual acuity have been recorded in the chart.  I have made referrals, counseling, and provided education to the patient based on review of the above and I have provided the patient with a written personalized care plan for preventive services.     [name],  PA-C   07/09/2015

## 2015-08-29 DIAGNOSIS — H26492 Other secondary cataract, left eye: Secondary | ICD-10-CM | POA: Diagnosis not present

## 2015-08-29 DIAGNOSIS — Z961 Presence of intraocular lens: Secondary | ICD-10-CM | POA: Diagnosis not present

## 2015-08-29 DIAGNOSIS — H43813 Vitreous degeneration, bilateral: Secondary | ICD-10-CM | POA: Diagnosis not present

## 2015-08-29 DIAGNOSIS — H04123 Dry eye syndrome of bilateral lacrimal glands: Secondary | ICD-10-CM | POA: Diagnosis not present

## 2015-08-31 ENCOUNTER — Other Ambulatory Visit: Payer: Self-pay | Admitting: *Deleted

## 2015-08-31 DIAGNOSIS — E782 Mixed hyperlipidemia: Secondary | ICD-10-CM

## 2015-08-31 MED ORDER — GEMFIBROZIL 600 MG PO TABS: ORAL_TABLET | ORAL | Status: DC

## 2015-10-18 ENCOUNTER — Encounter: Payer: Self-pay | Admitting: Internal Medicine

## 2015-10-22 ENCOUNTER — Other Ambulatory Visit: Payer: Self-pay | Admitting: Internal Medicine

## 2015-10-22 DIAGNOSIS — Z1231 Encounter for screening mammogram for malignant neoplasm of breast: Secondary | ICD-10-CM

## 2015-11-05 ENCOUNTER — Ambulatory Visit
Admission: RE | Admit: 2015-11-05 | Discharge: 2015-11-05 | Disposition: A | Discharge status: Home / Self Care | Payer: Medicare HMO | Source: Ambulatory Visit | Attending: Internal Medicine | Admitting: Internal Medicine

## 2015-11-05 DIAGNOSIS — Z1231 Encounter for screening mammogram for malignant neoplasm of breast: Secondary | ICD-10-CM | POA: Diagnosis not present

## 2015-12-05 ENCOUNTER — Ambulatory Visit (INDEPENDENT_AMBULATORY_CARE_PROVIDER_SITE_OTHER): Payer: Medicare HMO | Admitting: Internal Medicine

## 2015-12-05 ENCOUNTER — Encounter: Payer: Self-pay | Admitting: Internal Medicine

## 2015-12-05 VITALS — BP 116/80 | HR 76 | Temp 97.8°F | Resp 16 | Ht 64.75 in | Wt 142.6 lb

## 2015-12-05 DIAGNOSIS — Z0001 Encounter for general adult medical examination with abnormal findings: Secondary | ICD-10-CM

## 2015-12-05 DIAGNOSIS — R030 Elevated blood-pressure reading, without diagnosis of hypertension: Secondary | ICD-10-CM | POA: Diagnosis not present

## 2015-12-05 DIAGNOSIS — Z23 Encounter for immunization: Secondary | ICD-10-CM | POA: Diagnosis not present

## 2015-12-05 DIAGNOSIS — E559 Vitamin D deficiency, unspecified: Secondary | ICD-10-CM

## 2015-12-05 DIAGNOSIS — Z0000 Encounter for general adult medical examination without abnormal findings: Secondary | ICD-10-CM | POA: Diagnosis not present

## 2015-12-05 DIAGNOSIS — R6889 Other general symptoms and signs: Secondary | ICD-10-CM | POA: Diagnosis not present

## 2015-12-05 DIAGNOSIS — Z79899 Other long term (current) drug therapy: Secondary | ICD-10-CM | POA: Diagnosis not present

## 2015-12-05 DIAGNOSIS — R7303 Prediabetes: Secondary | ICD-10-CM | POA: Diagnosis not present

## 2015-12-05 DIAGNOSIS — Z136 Encounter for screening for cardiovascular disorders: Secondary | ICD-10-CM | POA: Diagnosis not present

## 2015-12-05 DIAGNOSIS — M8580 Other specified disorders of bone density and structure, unspecified site: Secondary | ICD-10-CM

## 2015-12-05 DIAGNOSIS — E782 Mixed hyperlipidemia: Secondary | ICD-10-CM | POA: Diagnosis not present

## 2015-12-05 DIAGNOSIS — Z1211 Encounter for screening for malignant neoplasm of colon: Secondary | ICD-10-CM

## 2015-12-05 NOTE — Progress Notes (Signed)
[facility]                   Madison Gonzalez, M.D.    [name], P.A.-C      [name], P.A.-C   [name] [id number] Telephone [phone] Telefax [phone]    Annual Screening/Preventative Visit And Comprehensive Evaluation &  Examination     This very nice 75y.o.WWF presents for a Wellness/Preventative Visit & comprehensive evaluation and management of multiple medical co-morbidities.  Patient has been followed for HTN, Prediabetes, Hyperlipidemia and Vitamin D Deficiency.      Patient has been followed several years for labile HTN. Patient's BP has been controlled at home and patient denies any cardiac symptoms as chest pain, palpitations, shortness of breath, dizziness or ankle swelling. Today's BP is  116/80      Patient is Statin Intolerant and her  hyperlipidemia is not controlled with diet and GFemfibrozil. Patient denies myalgias or other medication SE's. Last lipids were not at goal: Lab Results  Component Value Date   CHOL 204 (H) 07/09/2015   HDL 58 07/09/2015   LDLCALC 137 (H) 07/09/2015   TRIG 43 07/09/2015   CHOLHDL 3.5 07/09/2015      Patient has prediabetes predating circa June 2015 with A1c 5.8% and patient denies reactive hypoglycemic symptoms, visual blurring, diabetic polys, or paresthesias. Last A1c was  Lab Results  Component Value Date   HGBA1C 5.9 (H) 03/29/2015      Finally, patient has history of Vitamin D Deficiency and last Vitamin D was at goal  Lab Results  Component Value Date   VD25OH 71 03/29/2015   Current Outpatient Prescriptions on File Prior to Visit  Medication Sig  . calcipotriene (DOVONOX) 0.005 % ointment Apply topically 2 (two) times daily. (Patient taking differently: Apply 1 application topically 2 (two) times daily as needed (psoriasis). )  . Calcium Carbonate-Vitamin D (CALCIUM + D  PO) Take 1 tablet by mouth daily.  . Cholecalciferol (VITAMIN D3) 5000 UNITS TABS Take 5,000-10,000 Units by mouth daily. Take 2 capsules (10,000 units) on Tuesday, Thursday, Saturday, take 1 capsule (5,000 units) on Sunday, Monday, Wednesday, Friday  . gemfibrozil (LOPID) 600 MG tablet Take 1 tablet 2 x day with meal for cholesterol  . MAGNESIUM PO Take 1 tablet by mouth daily.   . Multiple Vitamin (MULTIVITAMIN WITH MINERALS) TABS tablet Take 1 tablet by mouth daily.   No current facility-administered medications on file prior to visit.    Allergies  Allergen Reactions  . Atorvastatin Other (See Comments)    myalgia  . Fosamax [Alendronate Sodium] Other (See Comments)    Myalgia   Past Medical History:  Diagnosis Date  . History of ITP   . Hyperlipidemia   . IBS (irritable bowel syndrome)   . Osteopenia   . Psoriasis    Health Maintenance  Topic Date Due  . PNA vac Low Risk Adult (2 of 2 - PCV13) 06/30/2015  . INFLUENZA VACCINE  11/20/2015  . TETANUS/TDAP  02/09/2019  . DEXA SCAN  Completed  . ZOSTAVAX  Completed   Immunization History  Administered Date(s) Administered  . Influenza, High Dose Seasonal PF 01/25/2014  . Influenza-Unspecified 12/21/2014  . PPD Test 03/14/2013  . Pneumococcal Polysaccharide-23 02/08/2009,  06/30/2014  . Td 02/08/2009  . Zoster 03/29/2009   Past Surgical History:  Procedure Laterality Date  . ABDOMINAL HYSTERECTOMY    . APPENDECTOMY    . BREAST SURGERY Right    Biospy, benign  . EYE SURGERY Bilateral    Cataracts  . EYE SURGERY     Lense implants  . TUBAL LIGATION     Family History  Problem Relation Age of Onset  . AAA (abdominal aortic aneurysm) Mother   . Hypertension Mother   . Hypertension Father   . Heart disease Father   . Hyperlipidemia Father   . Cancer Maternal Aunt     uterine   Social History  Substance Use Topics  . Smoking status: Never Smoker  . Smokeless tobacco: Not on file  . Alcohol use Yes      Comment: occasionl    ROS Constitutional: Denies fever, chills, weight loss/gain, headaches, insomnia,  night sweats, and change in appetite. Does c/o fatigue. Eyes: Denies redness, blurred vision, diplopia, discharge, itchy, watery eyes.  ENT: Denies discharge, congestion, post nasal drip, epistaxis, sore throat, earache, hearing loss, dental pain, Tinnitus, Vertigo, Sinus pain, snoring.  Cardio: Denies chest pain, palpitations, irregular heartbeat, syncope, dyspnea, diaphoresis, orthopnea, PND, claudication, edema Respiratory: denies cough, dyspnea, DOE, pleurisy, hoarseness, laryngitis, wheezing.  Gastrointestinal: Denies dysphagia, heartburn, reflux, water brash, pain, cramps, nausea, vomiting, bloating, diarrhea, constipation, hematemesis, melena, hematochezia, jaundice, hemorrhoids Genitourinary: Denies dysuria, frequency, urgency, nocturia, hesitancy, discharge, hematuria, flank pain Breast: Breast lumps, nipple discharge, bleeding.  Musculoskeletal: Denies arthralgia, myalgia, stiffness, Jt. Swelling, pain, limp, and strain/sprain. Denies falls. Skin: Denies puritis, rash, hives, warts, acne, eczema, changing in skin lesion Neuro: No weakness, tremor, incoordination, spasms, paresthesia, pain Psychiatric: Denies confusion, memory loss, sensory loss. Denies Depression. Endocrine: Denies change in weight, skin, hair change, nocturia, and paresthesia, diabetic polys, visual blurring, hyper / hypo glycemic episodes.  Heme/Lymph: No excessive bleeding, bruising, enlarged lymph nodes.  Physical Exam  BP 116/80   Pulse 76   Temp 97.8 F (36.6 C)   Resp 16   Ht 5' 4.75" (1.645 m)   Wt 142 lb 9.6 oz (64.7 kg)   BMI 23.91 kg/m   General Appearance: Well nourished and in no apparent distress.  Eyes: PERRLA, EOMs, conjunctiva no swelling or erythema, normal fundi and vessels. Sinuses: No frontal/maxillary tenderness ENT/Mouth: EACs patent / TMs  nl. Nares clear without erythema,  swelling, mucoid exudates. Oral hygiene is good. No erythema, swelling, or exudate. Tongue normal, non-obstructing. Tonsils not swollen or erythematous. Hearing normal.  Neck: Supple, thyroid normal. No bruits, nodes or JVD. Respiratory: Respiratory effort normal.  BS equal and clear bilateral without rales, rhonci, wheezing or stridor. Cardio: Heart sounds are normal with regular rate and rhythm and no murmurs, rubs or gallops. Peripheral pulses are normal and equal bilaterally without edema. No aortic or femoral bruits. Chest: symmetric with normal excursions and percussion. Breasts: Symmetric, without lumps, nipple discharge, retractions, or fibrocystic changes.  Abdomen: Flat, soft with bowel sounds active. Nontender, no guarding, rebound, hernias, masses, or organomegaly.  Lymphatics: Non tender without lymphadenopathy.  Genitourinary:  Musculoskeletal: Full ROM all peripheral extremities, joint stability, 5/5 strength, and normal gait. Skin: Warm and dry without rashes, lesions, cyanosis, clubbing or  ecchymosis.  Neuro: Cranial nerves intact, reflexes equal bilaterally. Normal muscle tone, no cerebellar symptoms. Sensation intact.  Pysch: Alert and oriented X 3, normal affect, Insight and Judgment appropriate.   Assessment and Plan  1. Annual Preventative Screening Examination  -  Microalbumin / creatinine urine ratio - EKG 12-Lead - [place], RETROPERITNL ABD,  LTD - POC Hemoccult Bld/Stl  - Urinalysis, Routine w reflex microscopic  - CBC with Differential/Platelet - BASIC METABOLIC PANEL WITH GFR - Hepatic function panel - Magnesium - Lipid panel - TSH - Hemoglobin A1c - Insulin, random - VITAMIN D 25 Hydroxy   2. Elevated BP w/o Dx HTN  - Microalbumin / creatinine urine ratio - EKG 12-Lead - [place], RETROPERITNL ABD,  LTD - TSH  3. Hyperlipidemia  - Lipid panel - TSH  4. Prediabetes  - Hemoglobin A1c - Insulin, random  5. Vitamin D deficiency  - VITAMIN D 25 Hydroxy    6. Osteopenia   7. Colon cancer screening  - POC Hemoccult Bld/Stl  8. Screening for ischemic heart disease   9. Medication management  - Urinalysis, Routine w reflex microscopic  - CBC with Differential/Platelet - BASIC METABOLIC PANEL WITH GFR - Hepatic function panel - Magnesium  10. Need for prophylactic vaccination against Streptococcus pneumoniae (pneumococcus)  - Pneumococcal conjugate vaccine 13-valent      Continue prudent diet as discussed, weight control, BP monitoring, regular exercise, and medications. Discussed med's effects and SE's. Screening labs and tests as requested with regular follow-up as recommended. Over 40 minutes of exam, counseling, chart review and high complex critical decision making was performed.

## 2015-12-05 NOTE — Patient Instructions (Signed)

## 2015-12-06 LAB — URINALYSIS, ROUTINE W REFLEX MICROSCOPIC
Bilirubin Urine: NEGATIVE
Glucose, UA: NEGATIVE
Hgb urine dipstick: NEGATIVE
NITRITE: NEGATIVE
PH: 5 (ref 5.0–8.0)
Protein, ur: NEGATIVE
SPECIFIC GRAVITY, URINE: 1.022 (ref 1.001–1.035)

## 2015-12-06 LAB — URINALYSIS, MICROSCOPIC ONLY
Casts: NONE SEEN [LPF]
SQUAMOUS EPITHELIAL / LPF: NONE SEEN [HPF] (ref <=5)
Yeast: NONE SEEN [HPF]

## 2015-12-06 LAB — CBC WITH DIFFERENTIAL/PLATELET
BASOS PCT: 1 %
Basophils Absolute: 59 cells/uL (ref 0–200)
EOS ABS: 59 {cells}/uL (ref 15–500)
Eosinophils Relative: 1 %
HEMATOCRIT: 36.7 % (ref 35.0–45.0)
HEMOGLOBIN: 12.4 g/dL (ref 11.7–15.5)
LYMPHS ABS: 1593 {cells}/uL (ref 850–3900)
Lymphocytes Relative: 27 %
MCH: 30.5 pg (ref 27.0–33.0)
MCHC: 33.8 g/dL (ref 32.0–36.0)
MCV: 90.4 fL (ref 80.0–100.0)
MONO ABS: 354 {cells}/uL (ref 200–950)
MPV: 11.3 fL (ref 7.5–12.5)
Monocytes Relative: 6 %
NEUTROS ABS: 3835 {cells}/uL (ref 1500–7800)
Neutrophils Relative %: 65 %
Platelets: 202 10*3/uL (ref 140–400)
RBC: 4.06 MIL/uL (ref 3.80–5.10)
RDW: 13.7 % (ref 11.0–15.0)
WBC: 5.9 10*3/uL (ref 3.8–10.8)

## 2015-12-06 LAB — HEPATIC FUNCTION PANEL
ALBUMIN: 4.4 g/dL (ref 3.6–5.1)
ALK PHOS: 76 U/L (ref 33–130)
ALT: 17 U/L (ref 6–29)
AST: 23 U/L (ref 10–35)
BILIRUBIN INDIRECT: 0.5 mg/dL (ref 0.2–1.2)
BILIRUBIN TOTAL: 0.7 mg/dL (ref 0.2–1.2)
Bilirubin, Direct: 0.2 mg/dL (ref <=0.2)
TOTAL PROTEIN: 7.1 g/dL (ref 6.1–8.1)

## 2015-12-06 LAB — LIPID PANEL
CHOL/HDL RATIO: 2.4 ratio (ref <=5.0)
CHOLESTEROL: 181 mg/dL (ref 125–200)
HDL: 76 mg/dL (ref >=46)
LDL Cholesterol: 99 mg/dL (ref <130)
Triglycerides: 28 mg/dL (ref <150)
VLDL: 6 mg/dL (ref <30)

## 2015-12-06 LAB — BASIC METABOLIC PANEL WITH GFR
BUN: 16 mg/dL (ref 7–25)
CHLORIDE: 106 mmol/L (ref 98–110)
CO2: 24 mmol/L (ref 20–31)
Calcium: 9.6 mg/dL (ref 8.6–10.4)
Creat: 0.73 mg/dL (ref 0.60–0.93)
GFR, EST NON AFRICAN AMERICAN: 80 mL/min (ref >=60)
GLUCOSE: 83 mg/dL (ref 65–99)
POTASSIUM: 3.9 mmol/L (ref 3.5–5.3)
Sodium: 141 mmol/L (ref 135–146)

## 2015-12-06 LAB — MICROALBUMIN / CREATININE URINE RATIO
CREATININE, URINE: 207 mg/dL (ref 20–320)
Microalb Creat Ratio: 36 mcg/mg creat — ABNORMAL HIGH (ref <30)
Microalb, Ur: 7.4 mg/dL

## 2015-12-06 LAB — HEMOGLOBIN A1C
HEMOGLOBIN A1C: 5.4 % (ref <5.7)
Mean Plasma Glucose: 108 mg/dL

## 2015-12-06 LAB — MAGNESIUM: Magnesium: 1.9 mg/dL (ref 1.5–2.5)

## 2015-12-06 LAB — VITAMIN D 25 HYDROXY (VIT D DEFICIENCY, FRACTURES): Vit D, 25-Hydroxy: 45 ng/mL (ref 30–100)

## 2015-12-06 LAB — TSH: TSH: 0.53 mIU/L

## 2015-12-06 LAB — INSULIN, RANDOM: Insulin: <1 u[IU]/mL — ABNORMAL LOW (ref 2.0–19.6)

## 2015-12-08 ENCOUNTER — Encounter: Payer: Self-pay | Admitting: Internal Medicine

## 2016-01-22 DIAGNOSIS — R69 Illness, unspecified: Secondary | ICD-10-CM | POA: Diagnosis not present

## 2016-03-21 ENCOUNTER — Encounter: Payer: Self-pay | Admitting: Internal Medicine

## 2016-03-21 ENCOUNTER — Ambulatory Visit (INDEPENDENT_AMBULATORY_CARE_PROVIDER_SITE_OTHER): Payer: Medicare HMO | Admitting: Internal Medicine

## 2016-03-21 VITALS — BP 120/66 | HR 64 | Temp 97.8°F | Resp 16 | Ht 64.75 in | Wt 142.0 lb

## 2016-03-21 DIAGNOSIS — E782 Mixed hyperlipidemia: Secondary | ICD-10-CM

## 2016-03-21 DIAGNOSIS — K589 Irritable bowel syndrome without diarrhea: Secondary | ICD-10-CM

## 2016-03-21 DIAGNOSIS — E559 Vitamin D deficiency, unspecified: Secondary | ICD-10-CM | POA: Diagnosis not present

## 2016-03-21 DIAGNOSIS — Z79899 Other long term (current) drug therapy: Secondary | ICD-10-CM | POA: Diagnosis not present

## 2016-03-21 DIAGNOSIS — R030 Elevated blood-pressure reading, without diagnosis of hypertension: Secondary | ICD-10-CM | POA: Diagnosis not present

## 2016-03-21 DIAGNOSIS — R7303 Prediabetes: Secondary | ICD-10-CM

## 2016-03-21 NOTE — Progress Notes (Signed)
Assessment and Plan:  Hypertension:  -Continue medication,  -monitor blood pressure at home.  -Continue DASH diet.   -Reminder to go to the ER if any CP, SOB, nausea, dizziness, severe HA, changes vision/speech, left arm numbness and tingling, and jaw pain.  Cholesterol: -Continue diet and exercise.  -cont Lopid  Pre-diabetes: -Continue diet and exercise.   Vitamin D Def: -continue medications.   IBS -cont meds -avoid trigger foods -stable  Continue diet and meds as discussed. Further disposition pending results of labs.  HPI 75 y.o. female  presents for 3 month follow up with hypertension, hyperlipidemia, prediabetes and vitamin D.   Her blood pressure has been controlled at home, today their BP is BP: 120/66.   She does workout. She denies chest pain, shortness of breath, dizziness.   She is on cholesterol medication and denies myalgias. Her cholesterol is at goal. The cholesterol last visit was:   Lab Results  Component Value Date   CHOL 181 12/05/2015   HDL 76 12/05/2015   LDLCALC 99 12/05/2015   TRIG 28 12/05/2015   CHOLHDL 2.4 12/05/2015     She has been working on diet and exercise for prediabetes, and denies foot ulcerations, hyperglycemia, hypoglycemia , increased appetite, nausea, paresthesia of the feet, polydipsia, polyuria, visual disturbances, vomiting and weight loss. Last A1C in the office was:  Lab Results  Component Value Date   HGBA1C 5.4 12/05/2015    Patient is on Vitamin D supplement.  Lab Results  Component Value Date   VD25OH 45 12/05/2015     Her IBS has been stable. She has some episodes of diarrhea but generally does well with her medications.  If she is not going out she is not taking the medications and allowing her bowels to take their natural course.    Current Medications:  Current Outpatient Prescriptions on File Prior to Visit  Medication Sig Dispense Refill  . Calcium Carbonate-Vitamin D (CALCIUM + D PO) Take 1 tablet by mouth  daily.    . Cholecalciferol (VITAMIN D3) 5000 UNITS TABS Take 5,000-10,000 Units by mouth daily. Take 2 capsules (10,000 units) on Tuesday, Thursday, Saturday, take 1 capsule (5,000 units) on Sunday, Monday, Wednesday, Friday    . gemfibrozil (LOPID) 600 MG tablet Take 1 tablet 2 x day with meal for cholesterol 120 tablet 3  . MAGNESIUM PO Take 1 tablet by mouth daily.     . Multiple Vitamin (MULTIVITAMIN WITH MINERALS) TABS tablet Take 1 tablet by mouth daily.     No current facility-administered medications on file prior to visit.     Medical History:  Past Medical History:  Diagnosis Date  . History of ITP   . Hyperlipidemia   . IBS (irritable bowel syndrome)   . Osteopenia   . Psoriasis     Allergies:  Allergies  Allergen Reactions  . Atorvastatin Other (See Comments)    myalgia  . Fosamax [Alendronate Sodium] Other (See Comments)    Myalgia     Review of Systems:  Review of Systems  Constitutional: Negative for chills, fever and malaise/fatigue.  HENT: Negative for congestion, ear pain and sore throat.   Eyes: Negative.   Respiratory: Negative for cough, shortness of breath and wheezing.   Cardiovascular: Negative for chest pain, palpitations and leg swelling.  Gastrointestinal: Negative for abdominal pain, blood in stool, constipation, diarrhea, heartburn and melena.  Genitourinary: Negative.   Skin: Negative.   Neurological: Negative for dizziness, sensory change, loss of consciousness and headaches.  Psychiatric/Behavioral: Negative for depression. The patient is not nervous/anxious and does not have insomnia.     Family history- Review and unchanged  Social history- Review and unchanged  Physical Exam: BP 120/66   Pulse 64   Temp 97.8 F (36.6 C) (Temporal)   Resp 16   Ht 5' 4.75" (1.645 m)   Wt 142 lb (64.4 kg)   BMI 23.81 kg/m  Wt Readings from Last 3 Encounters:  03/21/16 142 lb (64.4 kg)  12/05/15 142 lb 9.6 oz (64.7 kg)  07/09/15 141 lb (64 kg)     General Appearance: Well nourished well developed, in no apparent distress. Eyes: PERRLA, EOMs, conjunctiva no swelling or erythema ENT/Mouth: Ear canals normal without obstruction, swelling, erythma, discharge.  TMs normal bilaterally.  Oropharynx moist, clear, without exudate, or postoropharyngeal swelling. Neck: Supple, thyroid normal,no cervical adenopathy  Respiratory: Respiratory effort normal, Breath sounds clear A&P without rhonchi, wheeze, or rale.  No retractions, no accessory usage. Cardio: RRR with no MRGs. Brisk peripheral pulses without edema.  Abdomen: Soft, + BS,  Non tender, no guarding, rebound, hernias, masses. Musculoskeletal: Full ROM, 5/5 strength, Normal gait Skin: Warm, dry without rashes, lesions, ecchymosis.  Neuro: Awake and oriented X 3, Cranial nerves intact. Normal muscle tone, no cerebellar symptoms. Psych: Normal affect, Insight and Judgment appropriate.    [name], PA-C 12:10 PM [facility]

## 2016-06-23 ENCOUNTER — Ambulatory Visit: Payer: Self-pay | Admitting: Internal Medicine

## 2016-06-25 DIAGNOSIS — W19XXXA Unspecified fall, initial encounter: Secondary | ICD-10-CM | POA: Diagnosis not present

## 2016-06-25 DIAGNOSIS — Y92009 Unspecified place in unspecified non-institutional (private) residence as the place of occurrence of the external cause: Secondary | ICD-10-CM | POA: Diagnosis not present

## 2016-06-25 DIAGNOSIS — M25562 Pain in left knee: Secondary | ICD-10-CM | POA: Diagnosis not present

## 2016-08-28 DIAGNOSIS — R69 Illness, unspecified: Secondary | ICD-10-CM | POA: Diagnosis not present

## 2016-08-29 DIAGNOSIS — H35371 Puckering of macula, right eye: Secondary | ICD-10-CM | POA: Diagnosis not present

## 2016-08-29 DIAGNOSIS — H524 Presbyopia: Secondary | ICD-10-CM | POA: Diagnosis not present

## 2016-08-29 DIAGNOSIS — H26492 Other secondary cataract, left eye: Secondary | ICD-10-CM | POA: Diagnosis not present

## 2016-08-29 DIAGNOSIS — H43813 Vitreous degeneration, bilateral: Secondary | ICD-10-CM | POA: Diagnosis not present

## 2016-09-03 ENCOUNTER — Encounter: Payer: Self-pay | Admitting: *Deleted

## 2016-09-17 ENCOUNTER — Encounter: Payer: Self-pay | Admitting: *Deleted

## 2016-10-03 ENCOUNTER — Other Ambulatory Visit: Payer: Self-pay | Admitting: Internal Medicine

## 2016-10-03 DIAGNOSIS — Z1231 Encounter for screening mammogram for malignant neoplasm of breast: Secondary | ICD-10-CM

## 2016-11-05 ENCOUNTER — Ambulatory Visit
Admission: RE | Admit: 2016-11-05 | Discharge: 2016-11-05 | Disposition: A | Discharge status: Home / Self Care | Payer: Medicare HMO | Source: Ambulatory Visit | Attending: Internal Medicine | Admitting: Internal Medicine

## 2016-11-05 DIAGNOSIS — Z1231 Encounter for screening mammogram for malignant neoplasm of breast: Secondary | ICD-10-CM

## 2016-12-19 ENCOUNTER — Other Ambulatory Visit: Payer: Self-pay | Admitting: Internal Medicine

## 2016-12-19 DIAGNOSIS — E782 Mixed hyperlipidemia: Secondary | ICD-10-CM

## 2016-12-23 ENCOUNTER — Encounter: Payer: Self-pay | Admitting: Internal Medicine

## 2016-12-25 ENCOUNTER — Ambulatory Visit (INDEPENDENT_AMBULATORY_CARE_PROVIDER_SITE_OTHER): Payer: Medicare HMO | Admitting: Internal Medicine

## 2016-12-25 ENCOUNTER — Other Ambulatory Visit: Payer: Self-pay | Admitting: *Deleted

## 2016-12-25 VITALS — BP 138/78 | HR 64 | Temp 97.6°F | Resp 16 | Ht 65.0 in | Wt 126.0 lb

## 2016-12-25 DIAGNOSIS — E559 Vitamin D deficiency, unspecified: Secondary | ICD-10-CM | POA: Diagnosis not present

## 2016-12-25 DIAGNOSIS — R030 Elevated blood-pressure reading, without diagnosis of hypertension: Secondary | ICD-10-CM | POA: Diagnosis not present

## 2016-12-25 DIAGNOSIS — R7303 Prediabetes: Secondary | ICD-10-CM | POA: Diagnosis not present

## 2016-12-25 DIAGNOSIS — Z0001 Encounter for general adult medical examination with abnormal findings: Secondary | ICD-10-CM

## 2016-12-25 DIAGNOSIS — Z0000 Encounter for general adult medical examination without abnormal findings: Secondary | ICD-10-CM

## 2016-12-25 DIAGNOSIS — Z1211 Encounter for screening for malignant neoplasm of colon: Secondary | ICD-10-CM

## 2016-12-25 DIAGNOSIS — Z79899 Other long term (current) drug therapy: Secondary | ICD-10-CM

## 2016-12-25 DIAGNOSIS — Z136 Encounter for screening for cardiovascular disorders: Secondary | ICD-10-CM

## 2016-12-25 DIAGNOSIS — K589 Irritable bowel syndrome without diarrhea: Secondary | ICD-10-CM

## 2016-12-25 DIAGNOSIS — E782 Mixed hyperlipidemia: Secondary | ICD-10-CM

## 2016-12-25 MED ORDER — GEMFIBROZIL 600 MG PO TABS: ORAL_TABLET | ORAL | 3 refills | Status: DC

## 2016-12-25 NOTE — Patient Instructions (Signed)

## 2016-12-25 NOTE — Progress Notes (Signed)
[place] [facility] Unk Pinto, M.D.      [name], P.A.-C [facility]                [address] [place], [place] [address] Telephone [phone] Telefax [phone]  Annual Screening/Preventative Visit & Comprehensive Evaluation &  Examination     This very nice 76 y.o. [facility] presents for a Screening/Preventative Visit & comprehensive evaluation and management of multiple medical co-morbidities.  Patient has been followed expectantly for Labile HTN, Prediabetes, Hyperlipidemia and Vitamin D Deficiency. Patient has remote hx/o IBS which is controlled with prudent diet.       Patient's BP has been controlled at home and patient denies any cardiac symptoms as chest pain, palpitations, shortness of breath, dizziness or ankle swelling. Today's BP is at goal - 138/78.      Patient's hyperlipidemia is controlled with diet and Gemfibrozil (She's Statin Intolerant). Patient denies myalgias or other medication SE's. Last lipids were at goal: Lab Results  Component Value Date   CHOL 206 (H) 12/25/2016   HDL 56 12/25/2016   LDLCALC 99 12/05/2015   TRIG 42 12/25/2016   CHOLHDL 3.7 12/25/2016      Patient has prediabetes (A1c 5.8% in 2015) and patient denies reactive hypoglycemic symptoms, visual blurring, diabetic polys, or paresthesias. Last A1c was back to normal and at goal: Lab Results  Component Value Date   HGBA1C 5.4 12/25/2016      Finally, patient has history of Vitamin D Deficiency ("34" in 2010) and last Vitamin D was at goal: Lab Results  Component Value Date   VD25OH 7 12/25/2016   Current Outpatient Prescriptions on File Prior to Visit  Medication Sig  . Calcium -Vit D Take 1 tab daily.  Marland Kitchen VITAMIN D 5000 UNITS Take 2 caps on TThSat -  take 1 cap on SunMWF  . MAGNESIUM Take 1 tab daily.   . Multi-Vit w/Min Take 1 tab daily.   Allergies  Allergen Reactions  . Atorvastatin Other (See  Comments)    myalgia  . Fosamax [Alendronate Sodium] Other (See Comments)    Myalgia   Past Medical History:  Diagnosis Date  . History of ITP   . Hyperlipidemia   . IBS (irritable bowel syndrome)   . Osteopenia   . Psoriasis    Health Maintenance  Topic Date Due  . INFLUENZA VACCINE  11/19/2016  . TETANUS/TDAP  02/09/2019  . DEXA SCAN  Completed  . PNA vac Low Risk Adult  Completed   Immunization History  Administered Date(s) Administered  . Influenza, High Dose Seasonal PF 01/25/2014  . Influenza-Unspecified 12/21/2014, 01/22/2016  . PPD Test 03/14/2013  . Pneumococcal Conjugate-13 12/05/2015  . Pneumococcal Polysaccharide-23 02/08/2009, 06/30/2014  . Td 02/08/2009  . Zoster 03/29/2009   Past Surgical History:  Procedure Laterality Date  . ABDOMINAL HYSTERECTOMY    . APPENDECTOMY    . BREAST SURGERY Right    Biospy, benign  . EYE SURGERY Bilateral    Cataracts  . EYE SURGERY     Lense implants  . TUBAL LIGATION     Family History  Problem Relation Age of Onset  . AAA (abdominal aortic aneurysm) Mother   . Hypertension Mother   . Hypertension Father   . Heart disease Father   . Hyperlipidemia Father   . Cancer Maternal Aunt  uterine   Social History  Substance Use Topics  . Smoking status: Never Smoker  . Smokeless tobacco: Not on file  . Alcohol use Yes     Comment: occasionl    ROS Constitutional: Denies fever, chills, weight loss/gain, headaches, insomnia,  night sweats, and change in appetite. Does c/o fatigue. Eyes: Denies redness, blurred vision, diplopia, discharge, itchy, watery eyes.  ENT: Denies discharge, congestion, post nasal drip, epistaxis, sore throat, earache, hearing loss, dental pain, Tinnitus, Vertigo, Sinus pain, snoring.  Cardio: Denies chest pain, palpitations, irregular heartbeat, syncope, dyspnea, diaphoresis, orthopnea, PND, claudication, edema Respiratory: denies cough, dyspnea, DOE, pleurisy, hoarseness, laryngitis,  wheezing.  Gastrointestinal: Denies dysphagia, heartburn, reflux, water brash, pain, cramps, nausea, vomiting, bloating, diarrhea, constipation, hematemesis, melena, hematochezia, jaundice, hemorrhoids Genitourinary: Denies dysuria, frequency, urgency, nocturia, hesitancy, discharge, hematuria, flank pain Breast: Breast lumps, nipple discharge, bleeding.  Musculoskeletal: Denies arthralgia, myalgia, stiffness, Jt. Swelling, pain, limp, and strain/sprain. Denies falls. Skin: Denies puritis, rash, hives, warts, acne, eczema, changing in skin lesion Neuro: No weakness, tremor, incoordination, spasms, paresthesia, pain Psychiatric: Denies confusion, memory loss, sensory loss. Denies Depression. Endocrine: Denies change in weight, skin, hair change, nocturia, and paresthesia, diabetic polys, visual blurring, hyper / hypo glycemic episodes.  Heme/Lymph: No excessive bleeding, bruising, enlarged lymph nodes.  Physical Exam  BP 138/78   Pulse 64   Temp 97.6 F (36.4 C)   Resp 16   Ht 5\' 5"  (1.651 m)   Wt 126 lb (57.2 kg)   BMI 20.97 kg/m   General Appearance: Well nourished, well groomed and in no apparent distress.  Eyes: PERRLA, EOMs, conjunctiva no swelling or erythema, normal fundi and vessels. Sinuses: No frontal/maxillary tenderness ENT/Mouth: EACs patent / TMs  nl. Nares clear without erythema, swelling, mucoid exudates. Oral hygiene is good. No erythema, swelling, or exudate. Tongue normal, non-obstructing. Tonsils not swollen or erythematous. Hearing normal.  Neck: Supple, thyroid normal. No bruits, nodes or JVD. Respiratory: Respiratory effort normal.  BS equal and clear bilateral without rales, rhonci, wheezing or stridor. Cardio: Heart sounds are normal with regular rate and rhythm and no murmurs, rubs or gallops. Peripheral pulses are normal and equal bilaterally without edema. No aortic or femoral bruits. Chest: symmetric with normal excursions and percussion. Breasts:  Symmetric, without lumps, nipple discharge, retractions, or fibrocystic changes.  Abdomen: Flat, soft with bowel sounds active. Nontender, no guarding, rebound, hernias, masses, or organomegaly.  Lymphatics: Non tender without lymphadenopathy.  Musculoskeletal: Full ROM all peripheral extremities, joint stability, 5/5 strength, and normal gait. Skin: Warm and dry without rashes, lesions, cyanosis, clubbing or  ecchymosis.  Neuro: Cranial nerves intact, reflexes equal bilaterally. Normal muscle tone, no cerebellar symptoms. Sensation intact.  Pysch: Alert and oriented X 3, normal affect, Insight and Judgment appropriate.   Assessment and Plan  1. Annual Preventative Screening Examination  2. Elevated BP w/o Dx HTN  - EKG 12-Lead - Microalbumin / creatinine urine ratio - Urinalysis w microscopic + reflex cultur - CBC with Differential/Platelet - BASIC METABOLIC PANEL WITH GFR - Magnesium - TSH  3. Hyperlipidemia, mixed  - EKG 12-Lead - Hepatic function panel - Lipid panel - TSH - ezetimibe (ZETIA) 10 MG tablet; Take 1 tablet daily for High Cholesterol  Dispense: 90 tablet; Refill: 1  4. Prediabetes  - EKG 12-Lead - Hemoglobin A1c - Insulin, fasting  5. Vitamin D deficiency  - VITAMIN D 25 Hydroxy   6. Irritable bowel syndrome   7. Colon cancer screening  - POC Hemoccult Bld/Stl  8. Screening for ischemic heart disease  - EKG 12-Lead  9. Medication management  - Microalbumin / creatinine urine ratio - Urinalysis w microscopic + reflex cultur - CBC with Differential/Platelet - BASIC METABOLIC PANEL WITH GFR - Hepatic function panel - Magnesium - Lipid panel - TSH - Hemoglobin A1c - Insulin, fasting - VITAMIN D 25 Hydroxy             Patient was counseled in prudent diet to achieve/maintain BMI less than 25 for weight control, BP monitoring, regular exercise and medications. Discussed med's effects and SE's. Screening labs and tests as requested with  regular follow-up as recommended. Over 40 minutes of exam, counseling, chart review and high complex critical decision making was performed.

## 2016-12-26 LAB — INSULIN, FASTING: INSULIN: 1.7 u[IU]/mL — AB (ref 2.0–19.6)

## 2016-12-26 MED ORDER — EZETIMIBE 10 MG PO TABS: ORAL_TABLET | ORAL | 1 refills | Status: DC

## 2016-12-27 ENCOUNTER — Encounter: Payer: Self-pay | Admitting: Internal Medicine

## 2016-12-27 LAB — CBC WITH DIFFERENTIAL/PLATELET
BASOS ABS: 50 {cells}/uL (ref 0–200)
Basophils Relative: 0.8 %
Eosinophils Absolute: 139 cells/uL (ref 15–500)
Eosinophils Relative: 2.2 %
HEMATOCRIT: 35.8 % (ref 35.0–45.0)
HEMOGLOBIN: 12 g/dL (ref 11.7–15.5)
LYMPHS ABS: 1688 {cells}/uL (ref 850–3900)
MCH: 31.1 pg (ref 27.0–33.0)
MCHC: 33.5 g/dL (ref 32.0–36.0)
MCV: 92.7 fL (ref 80.0–100.0)
MPV: 11.5 fL (ref 7.5–12.5)
Monocytes Relative: 7 %
NEUTROS PCT: 63.2 %
Neutro Abs: 3982 cells/uL (ref 1500–7800)
Platelets: 179 10*3/uL (ref 140–400)
RBC: 3.86 10*6/uL (ref 3.80–5.10)
RDW: 12.4 % (ref 11.0–15.0)
Total Lymphocyte: 26.8 %
WBC: 6.3 10*3/uL (ref 3.8–10.8)
WBCMIX: 441 {cells}/uL (ref 200–950)

## 2016-12-27 LAB — LIPID PANEL
CHOL/HDL RATIO: 3.7 (calc) (ref <5.0)
Cholesterol: 206 mg/dL — ABNORMAL HIGH (ref <200)
HDL: 56 mg/dL (ref >50)
LDL Cholesterol (Calc): 137 mg/dL (calc) — ABNORMAL HIGH
NON-HDL CHOLESTEROL (CALC): 150 mg/dL — AB (ref <130)
TRIGLYCERIDES: 42 mg/dL (ref <150)

## 2016-12-27 LAB — BASIC METABOLIC PANEL WITH GFR
BUN: 23 mg/dL (ref 7–25)
CO2: 30 mmol/L (ref 20–32)
CREATININE: 0.72 mg/dL (ref 0.60–0.93)
Calcium: 9.2 mg/dL (ref 8.6–10.4)
Chloride: 105 mmol/L (ref 98–110)
GFR, EST AFRICAN AMERICAN: 94 mL/min/{1.73_m2} (ref >=60)
GFR, EST NON AFRICAN AMERICAN: 81 mL/min/{1.73_m2} (ref >=60)
Glucose, Bld: 83 mg/dL (ref 65–99)
POTASSIUM: 4.4 mmol/L (ref 3.5–5.3)
SODIUM: 142 mmol/L (ref 135–146)

## 2016-12-27 LAB — HEPATIC FUNCTION PANEL
AG RATIO: 1.8 (calc) (ref 1.0–2.5)
ALBUMIN MSPROF: 4.3 g/dL (ref 3.6–5.1)
ALKALINE PHOSPHATASE (APISO): 78 U/L (ref 33–130)
ALT: 19 U/L (ref 6–29)
AST: 22 U/L (ref 10–35)
BILIRUBIN DIRECT: 0.1 mg/dL (ref 0.0–0.2)
BILIRUBIN TOTAL: 0.7 mg/dL (ref 0.2–1.2)
Globulin: 2.4 g/dL (calc) (ref 1.9–3.7)
Indirect Bilirubin: 0.6 mg/dL (calc) (ref 0.2–1.2)
Total Protein: 6.7 g/dL (ref 6.1–8.1)

## 2016-12-27 LAB — URINALYSIS W MICROSCOPIC + REFLEX CULTURE
BILIRUBIN URINE: NEGATIVE
Bacteria, UA: NONE SEEN /HPF
GLUCOSE, UA: NEGATIVE
Hgb urine dipstick: NEGATIVE
Ketones, ur: NEGATIVE
NITRITES URINE, INITIAL: NEGATIVE
Protein, ur: NEGATIVE
RBC / HPF: NONE SEEN /HPF (ref 0–2)
SPECIFIC GRAVITY, URINE: 1.024 (ref 1.001–1.03)
SQUAMOUS EPITHELIAL / LPF: NONE SEEN /HPF (ref <=5)
WBC, UA: >=60 /HPF — AB (ref 0–5)
pH: 5.5 (ref 5.0–8.0)

## 2016-12-27 LAB — VITAMIN D 25 HYDROXY (VIT D DEFICIENCY, FRACTURES): VIT D 25 HYDROXY: 70 ng/mL (ref 30–100)

## 2016-12-27 LAB — TSH: TSH: 0.71 m[IU]/L (ref 0.40–4.50)

## 2016-12-27 LAB — URINE CULTURE
MICRO NUMBER:: 80985346
Result:: NO GROWTH
SPECIMEN QUALITY:: ADEQUATE

## 2016-12-27 LAB — MICROALBUMIN / CREATININE URINE RATIO
Creatinine, Urine: 156 mg/dL (ref 20–320)
MICROALB UR: 1.4 mg/dL
MICROALB/CREAT RATIO: 9 ug/mg{creat} (ref <30)

## 2016-12-27 LAB — HEMOGLOBIN A1C
EAG (MMOL/L): 6 (calc)
Hgb A1c MFr Bld: 5.4 % of total Hgb (ref <5.7)
MEAN PLASMA GLUCOSE: 108 (calc)

## 2016-12-27 LAB — MAGNESIUM: Magnesium: 2.2 mg/dL (ref 1.5–2.5)

## 2016-12-27 LAB — CULTURE INDICATED

## 2017-01-19 ENCOUNTER — Encounter: Payer: Self-pay | Admitting: Internal Medicine

## 2017-01-22 DIAGNOSIS — R69 Illness, unspecified: Secondary | ICD-10-CM | POA: Diagnosis not present

## 2017-04-04 NOTE — Progress Notes (Signed)
FOLLOW UP  Assessment and Plan:   Elevated BP without diagnosis of HTN Intermittent rare elevated readings; normal in office today Monitor blood pressure at home; patient to call if consistently greater than 130/80 Continue DASH diet.   Reminder to go to the ER if any CP, SOB, nausea, dizziness, severe HA, changes vision/speech, left arm numbness and tingling and jaw pain.  Cholesterol Continue medications  Continue low cholesterol diet and exercise.  Check lipid panel.   Prediabetes Discussed disease and risks; A1Cs have improved with recent weight loss Discussed diet/exercise, weight management  A1C  Vitamin D Def/ osteoporosis prevention Continue supplementation Check Vit D level  Continue diet and meds as discussed. Further disposition pending results of labs. Discussed med's effects and SE's.   Over 30 minutes of exam, counseling, chart review, and critical decision making was performed.   Future Appointments  Date Time Provider [place]  07/10/2017 10:30 AM Unk Pinto, MD [facility] None  01/20/2018  2:00 PM Unk Pinto, MD [facility] None    ----------------------------------------------------------------------------------------------------------------------  HPI 76 y.o. female  presents for 3 month follow up on BP (isolated elevated readings without diagnosis of htn), cholesterol, prediabetes and vitamin D deficiency. She has no concerns today; she is recovering from L foot 2nd digit fracture - she is buddy taping and is 8 weeks in.   BMI is Body mass index is 20.8 kg/m., she has been working on diet and exercise. Wt Readings from Last 3 Encounters:  04/06/17 125 lb (56.7 kg)  12/25/16 126 lb (57.2 kg)  03/21/16 142 lb (64.4 kg)   Her blood pressure has been controlled at home, today their BP is BP: 124/70  She does workout. She denies chest pain, shortness of breath, dizziness.   She is on cholesterol medication and denies myalgias. Her  cholesterol is at goal. The cholesterol last visit was:   Lab Results  Component Value Date   CHOL 206 (H) 12/25/2016   HDL 56 12/25/2016   LDLCALC 99 12/05/2015   TRIG 42 12/25/2016   CHOLHDL 3.7 12/25/2016    She has been working on diet and exercise for prediabetes, and denies increased appetite, nausea, paresthesia of the feet, polydipsia, polyuria, visual disturbances and vomiting. Last A1C in the office was:  Lab Results  Component Value Date   HGBA1C 5.4 12/25/2016   Patient is on Vitamin D supplement and was at goal at the last visit:   Lab Results  Component Value Date   VD25OH 66 12/25/2016       Current Medications:  Current Outpatient Medications on File Prior to Visit  Medication Sig  . Calcium Carbonate-Vitamin D (CALCIUM + D PO) Take 1 tablet by mouth daily.  . Cholecalciferol (VITAMIN D3) 5000 UNITS TABS Take 5,000-10,000 Units by mouth daily. Take 2 capsules (10,000 units) on Tuesday, Thursday, Saturday, take 1 capsule (5,000 units) on Sunday, Monday, Wednesday, Friday  . gemfibrozil (LOPID) 600 MG tablet TAKE ONE TABLET BY MOUTH TWICE DAILY WITH A MEAL FOR  CHOLESTEROL  . MAGNESIUM PO Take 1 tablet by mouth daily.   . Multiple Vitamin (MULTIVITAMIN WITH MINERALS) TABS tablet Take 1 tablet by mouth daily.  Marland Kitchen ezetimibe (ZETIA) 10 MG tablet Take 1 tablet daily for High Cholesterol (Patient not taking: Reported on 04/06/2017)   No current facility-administered medications on file prior to visit.      Allergies:  Allergies  Allergen Reactions  . Atorvastatin Other (See Comments)    myalgia  . Fosamax [Alendronate Sodium]  Other (See Comments)    Myalgia     Medical History:  Past Medical History:  Diagnosis Date  . History of ITP   . Hyperlipidemia   . IBS (irritable bowel syndrome)   . Osteopenia   . Psoriasis    Family history- Reviewed and unchanged Social history- Reviewed and unchanged   Review of Systems:  Review of Systems  Constitutional:  Negative for malaise/fatigue and weight loss.  HENT: Negative for hearing loss and tinnitus.   Eyes: Negative for blurred vision and double vision.  Respiratory: Negative for cough, shortness of breath and wheezing.   Cardiovascular: Negative for chest pain, palpitations, orthopnea, claudication and leg swelling.  Gastrointestinal: Negative for abdominal pain, blood in stool, constipation, diarrhea, heartburn, melena, nausea and vomiting.  Genitourinary: Negative.   Musculoskeletal: Negative for joint pain and myalgias.  Skin: Negative for rash.  Neurological: Negative for dizziness, tingling, sensory change, weakness and headaches.  Endo/Heme/Allergies: Negative for polydipsia.  Psychiatric/Behavioral: Negative.   All other systems reviewed and are negative.     Physical Exam: BP 124/70   Pulse (!) 51   Temp (!) 97.3 F (36.3 C)   Ht 5\' 5"  (1.651 m)   Wt 125 lb (56.7 kg)   SpO2 97%   BMI 20.80 kg/m  Wt Readings from Last 3 Encounters:  04/06/17 125 lb (56.7 kg)  12/25/16 126 lb (57.2 kg)  03/21/16 142 lb (64.4 kg)   General Appearance: Well nourished, in no apparent distress. Eyes: PERRLA, EOMs, conjunctiva no swelling or erythema Sinuses: No Frontal/maxillary tenderness ENT/Mouth: Ext aud canals clear, TMs without erythema, bulging. No erythema, swelling, or exudate on post pharynx.  Tonsils not swollen or erythematous. Hearing normal.  Neck: Supple, thyroid normal.  Respiratory: Respiratory effort normal, BS equal bilaterally without rales, rhonchi, wheezing or stridor.  Cardio: RRR with no MRGs. Brisk peripheral pulses without edema.  Abdomen: Soft, + BS.  Non tender, no guarding, rebound, hernias, masses. Lymphatics: Non tender without lymphadenopathy.  Musculoskeletal: Full ROM, 5/5 strength, Normal gait Skin: Warm, dry without rashes, lesions, ecchymosis.  Neuro: Cranial nerves intact. No cerebellar symptoms.  Psych: Awake and oriented X 3, normal affect, Insight  and Judgment appropriate.    Izora Ribas, NP 10:38 AM [facility]

## 2017-04-06 ENCOUNTER — Encounter: Payer: Self-pay | Admitting: Adult Health

## 2017-04-06 ENCOUNTER — Ambulatory Visit (INDEPENDENT_AMBULATORY_CARE_PROVIDER_SITE_OTHER): Payer: Medicare HMO | Admitting: Adult Health

## 2017-04-06 VITALS — BP 124/70 | HR 51 | Temp 97.3°F | Ht 65.0 in | Wt 125.0 lb

## 2017-04-06 DIAGNOSIS — E782 Mixed hyperlipidemia: Secondary | ICD-10-CM

## 2017-04-06 DIAGNOSIS — R030 Elevated blood-pressure reading, without diagnosis of hypertension: Secondary | ICD-10-CM | POA: Diagnosis not present

## 2017-04-06 DIAGNOSIS — Z79899 Other long term (current) drug therapy: Secondary | ICD-10-CM | POA: Diagnosis not present

## 2017-04-06 DIAGNOSIS — E559 Vitamin D deficiency, unspecified: Secondary | ICD-10-CM | POA: Diagnosis not present

## 2017-04-06 DIAGNOSIS — R7303 Prediabetes: Secondary | ICD-10-CM | POA: Diagnosis not present

## 2017-04-06 NOTE — Patient Instructions (Signed)

## 2017-04-07 LAB — CBC WITH DIFFERENTIAL/PLATELET
BASOS ABS: 58 {cells}/uL (ref 0–200)
Basophils Relative: 1 %
EOS ABS: 110 {cells}/uL (ref 15–500)
Eosinophils Relative: 1.9 %
HCT: 34.7 % — ABNORMAL LOW (ref 35.0–45.0)
Hemoglobin: 11.6 g/dL — ABNORMAL LOW (ref 11.7–15.5)
Lymphs Abs: 1798 cells/uL (ref 850–3900)
MCH: 30.7 pg (ref 27.0–33.0)
MCHC: 33.4 g/dL (ref 32.0–36.0)
MCV: 91.8 fL (ref 80.0–100.0)
MPV: 11.5 fL (ref 7.5–12.5)
Monocytes Relative: 7.1 %
Neutro Abs: 3422 cells/uL (ref 1500–7800)
Neutrophils Relative %: 59 %
PLATELETS: 212 10*3/uL (ref 140–400)
RBC: 3.78 10*6/uL — ABNORMAL LOW (ref 3.80–5.10)
RDW: 12.5 % (ref 11.0–15.0)
TOTAL LYMPHOCYTE: 31 %
WBC mixed population: 412 cells/uL (ref 200–950)
WBC: 5.8 10*3/uL (ref 3.8–10.8)

## 2017-04-07 LAB — HEPATIC FUNCTION PANEL
AG RATIO: 1.7 (calc) (ref 1.0–2.5)
ALKALINE PHOSPHATASE (APISO): 84 U/L (ref 33–130)
ALT: 15 U/L (ref 6–29)
AST: 21 U/L (ref 10–35)
Albumin: 4.3 g/dL (ref 3.6–5.1)
BILIRUBIN INDIRECT: 0.7 mg/dL (ref 0.2–1.2)
Bilirubin, Direct: 0.1 mg/dL (ref 0.0–0.2)
Globulin: 2.6 g/dL (calc) (ref 1.9–3.7)
TOTAL PROTEIN: 6.9 g/dL (ref 6.1–8.1)
Total Bilirubin: 0.8 mg/dL (ref 0.2–1.2)

## 2017-04-07 LAB — BASIC METABOLIC PANEL WITH GFR
BUN / CREAT RATIO: 40 (calc) — AB (ref 6–22)
BUN: 26 mg/dL — ABNORMAL HIGH (ref 7–25)
CHLORIDE: 104 mmol/L (ref 98–110)
CO2: 31 mmol/L (ref 20–32)
CREATININE: 0.65 mg/dL (ref 0.60–0.93)
Calcium: 9.8 mg/dL (ref 8.6–10.4)
GFR, Est African American: 99 mL/min/{1.73_m2} (ref >=60)
GFR, Est Non African American: 86 mL/min/{1.73_m2} (ref >=60)
GLUCOSE: 90 mg/dL (ref 65–99)
Potassium: 4.7 mmol/L (ref 3.5–5.3)
SODIUM: 141 mmol/L (ref 135–146)

## 2017-04-07 LAB — LIPID PANEL
Cholesterol: 187 mg/dL (ref <200)
HDL: 59 mg/dL (ref >50)
LDL Cholesterol (Calc): 119 mg/dL (calc) — ABNORMAL HIGH
NON-HDL CHOLESTEROL (CALC): 128 mg/dL (ref <130)
Total CHOL/HDL Ratio: 3.2 (calc) (ref <5.0)
Triglycerides: 31 mg/dL (ref <150)

## 2017-04-07 LAB — HEMOGLOBIN A1C
Hgb A1c MFr Bld: 5.4 % of total Hgb (ref <5.7)
Mean Plasma Glucose: 108 (calc)
eAG (mmol/L): 6 (calc)

## 2017-04-07 LAB — TSH: TSH: 0.8 m[IU]/L (ref 0.40–4.50)

## 2017-05-18 DIAGNOSIS — R69 Illness, unspecified: Secondary | ICD-10-CM | POA: Diagnosis not present

## 2017-06-19 DIAGNOSIS — M1711 Unilateral primary osteoarthritis, right knee: Secondary | ICD-10-CM | POA: Diagnosis not present

## 2017-07-09 NOTE — Progress Notes (Signed)
This very nice 77 y.o. WWF presents for 6 month follow up with HTN, HLD, Pre-Diabetes and Vitamin D Deficiency.      Patient's  BP has been controlled at home. Today's BP is at goal - 118/68. Patient has had no complaints of any cardiac type chest pain, palpitations, dyspnea / orthopnea / PND, dizziness, claudication, or dependent edema.     Hyperlipidemia is usually controlled with diet & meds Gemfibrozil ( Statin intolerant) & Zetia was Rx'd, but she never picked it up. Her last LDL was elevated at 119. Patient denies myalgias or other med SE's. Last Lipids were not at goal on Gemfibrozil alone: Lab Results  Component Value Date   CHOL 187 04/06/2017   HDL 59 04/06/2017   LDLCALC 119 (H) 04/06/2017   TRIG 31 04/06/2017   CHOLHDL 3.2 04/06/2017      Also, the patient has history of PreDiabetes (A1c 5.8%/2015) and has had no symptoms of reactive hypoglycemia, diabetic polys, paresthesias or visual blurring.  Last A1c was Normal & at goal: Lab Results  Component Value Date   HGBA1C 5.4 04/06/2017      Further, the patient also has history of Vitamin D Deficiency ("34"/2010)  and supplements vitamin D without any suspected side-effects. Last vitamin D was at goal: Lab Results  Component Value Date   VD25OH 28 12/25/2016   Current Outpatient Medications on File Prior to Visit  Medication Sig  . CALCIUM + D  Take 1 tabdaily.  Marland Kitchen VITAMIN D 5000 UNITS  Take 2 caps on TThSat &  1 capsule SunWF  . ezetimibe  10 MG tablet Take 1 tablet daily - Never picked up   . gemfibrozil  600 MG tablet TAKE 1 tab 2 x/ DAILY   . MAGNESIUM  Take 1 tab daily.   . MULTI-VIT w/ MINERALS Take 1 tab daily.   Allergies  Allergen Reactions  . Atorvastatin Other (See Comments)    myalgia  . Fosamax [Alendronate Sodium] Other (See Comments)    Myalgia   PMHx:   Past Medical History:  Diagnosis Date  . History of ITP   . Hyperlipidemia   . IBS (irritable bowel syndrome)   . Osteopenia   . Psoriasis     Immunization History  Administered Date(s) Administered  . Influenza, High Dose Seasonal PF 01/25/2014, 02/04/2017  . Influenza-Unspecified 12/21/2014, 01/22/2016  . PPD Test 03/14/2013  . Pneumococcal Conjugate-13 12/05/2015  . Pneumococcal Polysaccharide-23 02/08/2009, 06/30/2014  . Td 02/08/2009  . Zoster 03/29/2009   Past Surgical History:  Procedure Laterality Date  . ABDOMINAL HYSTERECTOMY    . APPENDECTOMY    . BREAST SURGERY Right    Biospy, benign  . EYE SURGERY Bilateral    Cataracts  . EYE SURGERY     Lense implants  . TUBAL LIGATION     FHx:    Reviewed / unchanged  SHx:    Reviewed / unchanged   Systems Review:  Constitutional: Denies fever, chills, wt changes, headaches, insomnia, fatigue, night sweats, change in appetite. Eyes: Denies redness, blurred vision, diplopia, discharge, itchy, watery eyes.  ENT: Denies discharge, congestion, post nasal drip, epistaxis, sore throat, earache, hearing loss, dental pain, tinnitus, vertigo, sinus pain, snoring.  CV: Denies chest pain, palpitations, irregular heartbeat, syncope, dyspnea, diaphoresis, orthopnea, PND, claudication or edema. Respiratory: denies cough, dyspnea, DOE, pleurisy, hoarseness, laryngitis, wheezing.  Gastrointestinal: Denies dysphagia, odynophagia, heartburn, reflux, water brash, abdominal pain or cramps, nausea, vomiting, bloating, diarrhea, constipation, hematemesis,  melena, hematochezia  or hemorrhoids. Genitourinary: Denies dysuria, frequency, urgency, nocturia, hesitancy, discharge, hematuria or flank pain. Musculoskeletal: Denies arthralgias, myalgias, stiffness, jt. swelling, pain, limping or strain/sprain.  Skin: Denies pruritus, rash, hives, warts, acne, eczema or change in skin lesion(s). Neuro: No weakness, tremor, incoordination, spasms, paresthesia or pain. Psychiatric: Denies confusion, memory loss or sensory loss. Endo: Denies change in weight, skin or hair change.  Heme/Lymph: No  excessive bleeding, bruising or enlarged lymph nodes.  Physical Exam  BP 118/68   Pulse (!) 52   Temp 97.8 F (36.6 C)   Resp 16   Ht 5\' 5"  (1.651 m)   Wt 127 lb 12.8 oz (58 kg)   BMI 21.27 kg/m   Appears  well nourished, well groomed  and in no distress.  Eyes: PERRLA, EOMs, conjunctiva no swelling or erythema. Sinuses: No frontal/maxillary tenderness ENT/Mouth: EAC's clear, TM's nl w/o erythema, bulging. Nares clear w/o erythema, swelling, exudates. Oropharynx clear without erythema or exudates. Oral hygiene is good. Tongue normal, non obstructing. Hearing intact.  Neck: Supple. Thyroid not palpable. Car 2+/2+ without bruits, nodes or JVD. Chest: Respirations nl with BS clear & equal w/o rales, rhonchi, wheezing or stridor.  Cor: Heart sounds normal w/ regular rate and rhythm without sig. murmurs, gallops, clicks or rubs. Peripheral pulses normal and equal  without edema.  Abdomen: Soft & bowel sounds normal. Non-tender w/o guarding, rebound, hernias, masses or organomegaly.  Lymphatics: Unremarkable.  Musculoskeletal: Full ROM all peripheral extremities, joint stability, 5/5 strength and normal gait.  Skin: Warm, dry without exposed rashes, lesions or ecchymosis apparent.  Neuro: Cranial nerves intact, reflexes equal bilaterally. Sensory-motor testing grossly intact. Tendon reflexes grossly intact.  Pysch: Alert & oriented x 3.  Insight and judgement nl & appropriate. No ideations.  Assessment and Plan:  1. Elevated BP w/o Dx HTN  - Continue medication, monitor blood pressure at home.  - Continue DASH diet. Reminder to go to the ER if any CP,  SOB, nausea, dizziness, severe HA, changes vision/speech.  - CBC with Differential/Platelet - BASIC METABOLIC PANEL WITH GFR - Magnesium - TSH  2. Hyperlipidemia, mixed  - Continue diet/meds, exercise,& lifestyle modifications.  - Continue monitor periodic cholesterol/liver & renal functions   - Hepatic function panel - Lipid  panel - TSH  3. Abnormal glucose  - Continue diet, exercise,  - lifestyle modifications.  - Monitor appropriate labs. - Hemoglobin A1c - Insulin, random  4. Vitamin D deficiency  - Continue supplementation.  - VITAMIN D 25 Hydroxyl  5. Prediabetes  - Hemoglobin A1c - Insulin, random  6. Medication management  - CBC with Differential/Platelet - BASIC METABOLIC PANEL WITH GFR - Hepatic function panel - Magnesium - Lipid panel - TSH - Hemoglobin A1c - Insulin, random - VITAMIN D 25 Hydroxl        Discussed  regular exercise, BP monitoring, weight control to achieve/maintain BMI less than 25 and discussed med and SE's. Recommended labs to assess and monitor clinical status with further disposition pending results of labs. Over 30 minutes of exam, counseling, chart review was performed.

## 2017-07-09 NOTE — Patient Instructions (Signed)

## 2017-07-10 ENCOUNTER — Ambulatory Visit (INDEPENDENT_AMBULATORY_CARE_PROVIDER_SITE_OTHER): Payer: Medicare HMO | Admitting: Internal Medicine

## 2017-07-10 ENCOUNTER — Encounter: Payer: Self-pay | Admitting: Internal Medicine

## 2017-07-10 VITALS — BP 118/68 | HR 52 | Temp 97.8°F | Resp 16 | Ht 65.0 in | Wt 127.8 lb

## 2017-07-10 DIAGNOSIS — R7303 Prediabetes: Secondary | ICD-10-CM

## 2017-07-10 DIAGNOSIS — Z79899 Other long term (current) drug therapy: Secondary | ICD-10-CM

## 2017-07-10 DIAGNOSIS — R7309 Other abnormal glucose: Secondary | ICD-10-CM | POA: Diagnosis not present

## 2017-07-10 DIAGNOSIS — R030 Elevated blood-pressure reading, without diagnosis of hypertension: Secondary | ICD-10-CM | POA: Diagnosis not present

## 2017-07-10 DIAGNOSIS — E559 Vitamin D deficiency, unspecified: Secondary | ICD-10-CM

## 2017-07-10 DIAGNOSIS — E782 Mixed hyperlipidemia: Secondary | ICD-10-CM

## 2017-07-12 ENCOUNTER — Other Ambulatory Visit: Payer: Self-pay | Admitting: Internal Medicine

## 2017-07-12 DIAGNOSIS — E782 Mixed hyperlipidemia: Secondary | ICD-10-CM

## 2017-07-12 MED ORDER — EZETIMIBE 10 MG PO TABS: ORAL_TABLET | ORAL | 1 refills | Status: DC

## 2017-07-13 ENCOUNTER — Encounter: Payer: Self-pay | Admitting: Internal Medicine

## 2017-07-13 ENCOUNTER — Telehealth: Payer: Self-pay | Admitting: *Deleted

## 2017-07-13 ENCOUNTER — Other Ambulatory Visit: Payer: Self-pay | Admitting: Internal Medicine

## 2017-07-13 LAB — HEPATIC FUNCTION PANEL
AG Ratio: 1.6 (calc) (ref 1.0–2.5)
ALKALINE PHOSPHATASE (APISO): 81 U/L (ref 33–130)
ALT: 12 U/L (ref 6–29)
AST: 18 U/L (ref 10–35)
Albumin: 4.2 g/dL (ref 3.6–5.1)
BILIRUBIN TOTAL: 0.5 mg/dL (ref 0.2–1.2)
Bilirubin, Direct: 0.1 mg/dL (ref 0.0–0.2)
Globulin: 2.6 g/dL (calc) (ref 1.9–3.7)
Indirect Bilirubin: 0.4 mg/dL (calc) (ref 0.2–1.2)
TOTAL PROTEIN: 6.8 g/dL (ref 6.1–8.1)

## 2017-07-13 LAB — BASIC METABOLIC PANEL WITH GFR
BUN: 22 mg/dL (ref 7–25)
CHLORIDE: 106 mmol/L (ref 98–110)
CO2: 29 mmol/L (ref 20–32)
Calcium: 9.4 mg/dL (ref 8.6–10.4)
Creat: 0.75 mg/dL (ref 0.60–0.93)
GFR, Est African American: 89 mL/min/{1.73_m2} (ref >=60)
GFR, Est Non African American: 77 mL/min/{1.73_m2} (ref >=60)
GLUCOSE: 83 mg/dL (ref 65–99)
POTASSIUM: 4.4 mmol/L (ref 3.5–5.3)
SODIUM: 141 mmol/L (ref 135–146)

## 2017-07-13 LAB — CBC WITH DIFFERENTIAL/PLATELET
Basophils Absolute: 61 cells/uL (ref 0–200)
Basophils Relative: 1.3 %
EOS PCT: 2.6 %
Eosinophils Absolute: 122 cells/uL (ref 15–500)
HCT: 34.7 % — ABNORMAL LOW (ref 35.0–45.0)
Hemoglobin: 11.7 g/dL (ref 11.7–15.5)
Lymphs Abs: 1387 cells/uL (ref 850–3900)
MCH: 30.5 pg (ref 27.0–33.0)
MCHC: 33.7 g/dL (ref 32.0–36.0)
MCV: 90.4 fL (ref 80.0–100.0)
MONOS PCT: 8.1 %
MPV: 12 fL (ref 7.5–12.5)
NEUTROS PCT: 58.5 %
Neutro Abs: 2750 cells/uL (ref 1500–7800)
PLATELETS: 192 10*3/uL (ref 140–400)
RBC: 3.84 10*6/uL (ref 3.80–5.10)
RDW: 12.5 % (ref 11.0–15.0)
Total Lymphocyte: 29.5 %
WBC mixed population: 381 cells/uL (ref 200–950)
WBC: 4.7 10*3/uL (ref 3.8–10.8)

## 2017-07-13 LAB — LIPID PANEL
CHOLESTEROL: 189 mg/dL (ref <200)
HDL: 53 mg/dL (ref >50)
LDL Cholesterol (Calc): 125 mg/dL (calc) — ABNORMAL HIGH
NON-HDL CHOLESTEROL (CALC): 136 mg/dL — AB (ref <130)
Total CHOL/HDL Ratio: 3.6 (calc) (ref <5.0)
Triglycerides: 37 mg/dL (ref <150)

## 2017-07-13 LAB — HEMOGLOBIN A1C
EAG (MMOL/L): 6.2 (calc)
Hgb A1c MFr Bld: 5.5 % of total Hgb (ref <5.7)
MEAN PLASMA GLUCOSE: 111 (calc)

## 2017-07-13 LAB — TSH: TSH: 0.72 mIU/L (ref 0.40–4.50)

## 2017-07-13 LAB — VITAMIN D 25 HYDROXY (VIT D DEFICIENCY, FRACTURES): Vit D, 25-Hydroxy: 75 ng/mL (ref 30–100)

## 2017-07-13 LAB — INSULIN, RANDOM: Insulin: 3.8 u[IU]/mL (ref 2.0–19.6)

## 2017-07-13 LAB — MAGNESIUM: Magnesium: 2.1 mg/dL (ref 1.5–2.5)

## 2017-07-13 NOTE — Telephone Encounter (Signed)
A telephone call was made to the patient in response to her [place] message regarding the cost of her Ezetimibe RX of $190. Per Dr Melford Aase, the patient was advised that the cost for #90 on the [place] coupon is $39.96 and the coupon was texted to her cell number by Dr Melford Aase.  The patient is aware.

## 2017-09-02 DIAGNOSIS — H5213 Myopia, bilateral: Secondary | ICD-10-CM | POA: Diagnosis not present

## 2017-09-02 DIAGNOSIS — H35371 Puckering of macula, right eye: Secondary | ICD-10-CM | POA: Diagnosis not present

## 2017-09-02 DIAGNOSIS — Z961 Presence of intraocular lens: Secondary | ICD-10-CM | POA: Diagnosis not present

## 2017-09-02 DIAGNOSIS — H43813 Vitreous degeneration, bilateral: Secondary | ICD-10-CM | POA: Diagnosis not present

## 2017-09-23 ENCOUNTER — Other Ambulatory Visit: Payer: Self-pay | Admitting: Internal Medicine

## 2017-09-23 DIAGNOSIS — Z1231 Encounter for screening mammogram for malignant neoplasm of breast: Secondary | ICD-10-CM

## 2017-10-27 NOTE — Progress Notes (Signed)
Patient ID: Madison Gonzalez, female   DOB: 1940/06/02, 77 y.o.   MRN: [id number]  MEDICARE ANNUAL WELLNESS VISIT AND FOLLOW UP  Assessment:   Diagnoses and all orders for this visit:  Encounter for Medicare annual wellness exam  Irritable bowel syndrome, unspecified type Controlled at this time without flare, takes imodium PRN   Psoriasis -     halobetasol (ULTRAVATE) 0.05 % ointment; Apply topically 2 (two) times daily.  Osteopenia, unspecified location Weight bearing exercises, high calcium diet, supplement vitamin D Repeat DEXA at 2 year this fall  Vitamin D deficiency At goal at recent check; continue to recommend supplementation for goal of 70-100 Defer vitamin D level  Prediabetes Recent A1Cs at goal Discussed diet/exercise, weight management  Defer A1C; check BMP  Medication management  Hyperlipidemia Continue medications Continue low cholesterol diet and exercise.  Check lipid panel.   History of ITP Monitor  Elevated BP w/o Dx HTN Monitor blood pressure at home; call if consistently over 130/80 Continue DASH diet.   Reminder to go to the ER if any CP, SOB, nausea, dizziness, severe HA, changes vision/speech, left arm numbness and tingling and jaw pain.  Body mass index (BMI) of 23.0-23.9 in adult Continue to recommend diet heavy in fruits and veggies and low in animal meats, cheeses, and dairy products, appropriate calorie intake Discuss exercise recommendations routinely Continue to monitor weight at each visit  Colon cancer screening Colonoscopy- patient declines a colonoscopy even though the risks and benefits were discussed at length. Colon cancer is 3rd most diagnosed cancer and 2nd leading cause of death in both men and women 80 years of age and older. Patient understands the risk of cancer and death with declining the test however they are willing to do cologuard screening instead. They understand that this is not as sensitive or specific as a  colonoscopy and they are still recommended to get a colonoscopy. The cologuard will be sent out to their house.    Over 30 minutes of exam, counseling, chart review, and critical decision making was performed  Future Appointments  Date Time Provider [place]  11/09/2017  9:30 AM [facility] 2 [facility]  02/04/2018 11:00 AM Unk Pinto, MD [facility] None     Plan:   During the course of the visit the patient was educated and counseled about appropriate screening and preventive services including:    Pneumococcal vaccine   Influenza vaccine  Td vaccine  Prevnar 13  Screening electrocardiogram  Screening mammography  Bone densitometry screening  Colorectal cancer screening  Diabetes screening  Glaucoma screening  Nutrition counseling   Advanced directives: given info/requested copies   Subjective:   Madison Gonzalez is a 77 y.o. female who presents for Medicare Annual Wellness Visit and 3 month follow up on hypertension, glucose management, hyperlipidemia, vitamin D def.   BMI is Body mass index is 21.63 kg/m., she has been working on diet and exercise.  Wt Readings from Last 3 Encounters:  10/28/17 130 lb (59 kg)  07/10/17 127 lb 12.8 oz (58 kg)  04/06/17 125 lb (56.7 kg)   Her blood pressure has been controlled at home, today their BP is BP: 108/68 She does not workout. She denies chest pain, shortness of breath, dizziness.   She is on cholesterol medication (zetia, lopid) and denies myalgias. Her cholesterol is not at goal. The cholesterol last visit was:   Lab Results  Component Value Date   CHOL 189 07/10/2017   HDL 53  07/10/2017   LDLCALC 125 (H) 07/10/2017   TRIG 37 07/10/2017   CHOLHDL 3.6 07/10/2017   She has been working on diet and exercise for glucose management, and denies foot ulcerations, hyperglycemia, hypoglycemia , increased appetite, nausea, paresthesia of the feet, polydipsia, polyuria, visual  disturbances, vomiting and weight loss. Last A1C in the office was:  Lab Results  Component Value Date   HGBA1C 5.5 07/10/2017   Last GFR Lab Results  Component Value Date   GFRNONAA 77 07/10/2017   Patient is on Vitamin D supplement. Lab Results  Component Value Date   VD25OH 75 07/10/2017      Medication Review Current Outpatient Medications on File Prior to Visit  Medication Sig Dispense Refill  . Calcium Carbonate-Vitamin D (CALCIUM + D PO) Take 1 tablet by mouth daily.    . Cholecalciferol (VITAMIN D3) 5000 UNITS TABS Take 5,000-10,000 Units by mouth daily. Take 2 capsules (10,000 units) on Tuesday, Thursday, Saturday, take 1 capsule (5,000 units) on Sunday, Monday, Wednesday, Friday    . ezetimibe (ZETIA) 10 MG tablet Take 1 tablet daily for High Cholesterol 90 tablet 1  . gemfibrozil (LOPID) 600 MG tablet TAKE ONE TABLET BY MOUTH TWICE DAILY WITH A MEAL FOR  CHOLESTEROL 180 tablet 3  . MAGNESIUM PO Take 1 tablet by mouth daily.     . Multiple Vitamin (MULTIVITAMIN WITH MINERALS) TABS tablet Take 1 tablet by mouth daily.     No current facility-administered medications on file prior to visit.     Current Problems (verified) Patient Active Problem List   Diagnosis Date Noted  . Body mass index (BMI) of 23.0-23.9 in adult 03/31/2015  . Medication management 01/25/2014  . Elevated BP w/o Dx HTN 06/29/2013  . Hyperlipidemia 06/29/2013  . Vitamin D deficiency 06/29/2013  . Prediabetes 06/29/2013  . IBS (irritable bowel syndrome)   . Psoriasis   . Osteopenia   . History of ITP     Screening Tests Immunization History  Administered Date(s) Administered  . Influenza, High Dose Seasonal PF 01/25/2014, 02/04/2017  . Influenza-Unspecified 12/21/2014, 01/22/2016  . PPD Test 03/14/2013  . Pneumococcal Conjugate-13 12/05/2015  . Pneumococcal Polysaccharide-23 02/08/2009, 06/30/2014  . Td 02/08/2009  . Zoster 03/29/2009    Preventative care: Last colonoscopy: refuses -  willing to do cologuard Last mammogram: 10/2016, has scheduled DEXA: 12/2014   Prior vaccinations: TD or Tdap: 2010  Influenza:  2018  Pneumococcal: 2016 Prevnar13: 2017 Shingles/Zostavax: 2016  Names of Other Physician/Practitioners you currently use: 1. [facility]- here for primary care 2. Dr. Kathrin Penner, eye doctor, last visit 2019 3. [place], dentist, last visit 2019, q40m  Patient Care Team: Unk Pinto, MD as PCP - General (Internal Medicine) Shon Hough, MD as Consulting Physician (Ophthalmology) Allyn Kenner, MD (Dermatology)  Past Surgical History:  Procedure Laterality Date  . ABDOMINAL HYSTERECTOMY    . APPENDECTOMY    . BREAST SURGERY Right    Biospy, benign  . EYE SURGERY Bilateral    Cataracts  . EYE SURGERY     Lense implants  . TUBAL LIGATION     Family History  Problem Relation Age of Onset  . AAA (abdominal aortic aneurysm) Mother   . Hypertension Mother   . Hypertension Father   . Heart disease Father   . Hyperlipidemia Father   . Cancer Maternal Aunt        uterine   Social History   Tobacco Use  . Smoking status: Never  Smoker  . Smokeless tobacco: Never Used  Substance Use Topics  . Alcohol use: Yes    Comment: occasionl  . Drug use: No    MEDICARE WELLNESS OBJECTIVES: Tobacco use: She does not smoke.  Patient is not a former smoker. If yes, counseling given Alcohol Current alcohol use: social drinker Osteoporosis: postmenopausal estrogen deficiency, History of fracture in the past year: yes Fall risk: High Risk Hearing: normal Visual acuity: normal,  does perform annual eye exam Diet: well balanced Physical activity: Current Exercise Habits: Home exercise routine, Type of exercise: walking, Time (Minutes): 45, Frequency (Times/Week): 3, Weekly Exercise (Minutes/Week): 135, Intensity: Moderate, Exercise limited by: None identified Cardiac risk factors: Cardiac Risk Factors  include: dyslipidemia;advanced age (>83men, >47 women) Depression/mood screen:   Depression screen [facility] 2/9 10/28/2017  Decreased Interest 0  Down, Depressed, Hopeless 0  PHQ - 2 Score 0    ADLs:  In your present state of health, do you have any difficulty performing the following activities: 10/28/2017 07/10/2017  Hearing? N N  Vision? N N  Difficulty concentrating or making decisions? N N  Walking or climbing stairs? N N  Dressing or bathing? N N  Doing errands, shopping? N N  Some recent data might be hidden     Cognitive Testing  Alert? Yes  Normal Appearance?Yes  Oriented to person? Yes  Place? Yes   Time? Yes  Recall of three objects?  Yes  Can perform simple calculations? Yes  Displays appropriate judgment?Yes  Can read the correct time from a watch face?Yes  EOL planning: Does Patient Have a Medical Advance Directive?: Yes Type of Advance Directive: Healthcare Power of Attorney, Living will Does patient want to make changes to medical advance directive?: No - Patient declined Copy of [place] in Chart?: No - copy requested   Objective:   Today's Vitals   10/28/17 1115  BP: 108/68  Pulse: (!) 57  Temp: (!) 97.3 F (36.3 C)  SpO2: 97%  Weight: 130 lb (59 kg)  Height: 5\' 5"  (1.651 m)   Body mass index is 21.63 kg/m.  General appearance: alert, no distress, WD/WN,  female HEENT: normocephalic, sclerae anicteric, TMs pearly, nares patent, no discharge or erythema, pharynx normal Oral cavity: MMM, no lesions Neck: supple, no lymphadenopathy, no thyromegaly, no masses Heart: RRR, normal S1, S2, no murmurs Lungs: CTA bilaterally, no wheezes, rhonchi, or rales Abdomen: +bs, soft, non tender, non distended, no masses, no hepatomegaly, no splenomegaly Musculoskeletal: nontender, no swelling, no obvious deformity Extremities: no edema, no cyanosis, no clubbing Pulses: 2+ symmetric, upper and lower extremities, normal cap refill Neurological: alert,  oriented x 3, CN2-12 intact, strength normal upper extremities and lower extremities, sensation normal throughout, DTRs 2+ throughout, no cerebellar signs, gait normal Psychiatric: normal affect, behavior normal, pleasant  Breast: defer Gyn: defer Rectal: defer   Medicare Attestation I have personally reviewed: The patient's medical and social history Their use of alcohol, tobacco or illicit drugs Their current medications and supplements The patient's functional ability including ADLs,fall risks, home safety risks, cognitive, and hearing and visual impairment Diet and physical activities Evidence for depression or mood disorders  The patient's weight, height, BMI, and visual acuity have been recorded in the chart.  I have made referrals, counseling, and provided education to the patient based on review of the above and I have provided the patient with a written personalized care plan for preventive services.     Izora Ribas, NP   10/28/2017

## 2017-10-28 ENCOUNTER — Ambulatory Visit (INDEPENDENT_AMBULATORY_CARE_PROVIDER_SITE_OTHER): Payer: Medicare HMO | Admitting: Adult Health

## 2017-10-28 ENCOUNTER — Encounter: Payer: Self-pay | Admitting: Adult Health

## 2017-10-28 VITALS — BP 108/68 | HR 57 | Temp 97.3°F | Ht 65.0 in | Wt 130.0 lb

## 2017-10-28 DIAGNOSIS — E782 Mixed hyperlipidemia: Secondary | ICD-10-CM

## 2017-10-28 DIAGNOSIS — R6889 Other general symptoms and signs: Secondary | ICD-10-CM

## 2017-10-28 DIAGNOSIS — Z0000 Encounter for general adult medical examination without abnormal findings: Secondary | ICD-10-CM

## 2017-10-28 DIAGNOSIS — L409 Psoriasis, unspecified: Secondary | ICD-10-CM

## 2017-10-28 DIAGNOSIS — R7303 Prediabetes: Secondary | ICD-10-CM

## 2017-10-28 DIAGNOSIS — Z79899 Other long term (current) drug therapy: Secondary | ICD-10-CM

## 2017-10-28 DIAGNOSIS — Z862 Personal history of diseases of the blood and blood-forming organs and certain disorders involving the immune mechanism: Secondary | ICD-10-CM

## 2017-10-28 DIAGNOSIS — M8580 Other specified disorders of bone density and structure, unspecified site: Secondary | ICD-10-CM | POA: Diagnosis not present

## 2017-10-28 DIAGNOSIS — E559 Vitamin D deficiency, unspecified: Secondary | ICD-10-CM

## 2017-10-28 DIAGNOSIS — Z0001 Encounter for general adult medical examination with abnormal findings: Secondary | ICD-10-CM

## 2017-10-28 DIAGNOSIS — R030 Elevated blood-pressure reading, without diagnosis of hypertension: Secondary | ICD-10-CM

## 2017-10-28 DIAGNOSIS — Z1211 Encounter for screening for malignant neoplasm of colon: Secondary | ICD-10-CM

## 2017-10-28 DIAGNOSIS — Z6823 Body mass index (BMI) 23.0-23.9, adult: Secondary | ICD-10-CM

## 2017-10-28 DIAGNOSIS — K589 Irritable bowel syndrome without diarrhea: Secondary | ICD-10-CM

## 2017-10-28 LAB — CBC WITH DIFFERENTIAL/PLATELET
Basophils Absolute: 51 cells/uL (ref 0–200)
Basophils Relative: 1.1 %
EOS ABS: 110 {cells}/uL (ref 15–500)
Eosinophils Relative: 2.4 %
HCT: 35.9 % (ref 35.0–45.0)
Hemoglobin: 12.1 g/dL (ref 11.7–15.5)
LYMPHS ABS: 1527 {cells}/uL (ref 850–3900)
MCH: 31 pg (ref 27.0–33.0)
MCHC: 33.7 g/dL (ref 32.0–36.0)
MCV: 92.1 fL (ref 80.0–100.0)
MPV: 11.1 fL (ref 7.5–12.5)
Monocytes Relative: 9.4 %
Neutro Abs: 2479 cells/uL (ref 1500–7800)
Neutrophils Relative %: 53.9 %
PLATELETS: 198 10*3/uL (ref 140–400)
RBC: 3.9 10*6/uL (ref 3.80–5.10)
RDW: 12.5 % (ref 11.0–15.0)
Total Lymphocyte: 33.2 %
WBC: 4.6 10*3/uL (ref 3.8–10.8)
WBCMIX: 432 {cells}/uL (ref 200–950)

## 2017-10-28 LAB — COMPLETE METABOLIC PANEL WITH GFR
AG RATIO: 1.9 (calc) (ref 1.0–2.5)
ALT: 15 U/L (ref 6–29)
AST: 19 U/L (ref 10–35)
Albumin: 4.6 g/dL (ref 3.6–5.1)
Alkaline phosphatase (APISO): 81 U/L (ref 33–130)
BILIRUBIN TOTAL: 0.7 mg/dL (ref 0.2–1.2)
BUN: 23 mg/dL (ref 7–25)
CHLORIDE: 106 mmol/L (ref 98–110)
CO2: 29 mmol/L (ref 20–32)
Calcium: 9.9 mg/dL (ref 8.6–10.4)
Creat: 0.69 mg/dL (ref 0.60–0.93)
GFR, Est African American: 97 mL/min/{1.73_m2} (ref >=60)
GFR, Est Non African American: 83 mL/min/{1.73_m2} (ref >=60)
Globulin: 2.4 g/dL (calc) (ref 1.9–3.7)
Glucose, Bld: 94 mg/dL (ref 65–99)
POTASSIUM: 4.4 mmol/L (ref 3.5–5.3)
Sodium: 143 mmol/L (ref 135–146)
Total Protein: 7 g/dL (ref 6.1–8.1)

## 2017-10-28 LAB — LIPID PANEL
CHOLESTEROL: 168 mg/dL (ref <200)
HDL: 60 mg/dL (ref >50)
LDL Cholesterol (Calc): 95 mg/dL (calc)
Non-HDL Cholesterol (Calc): 108 mg/dL (calc) (ref <130)
TRIGLYCERIDES: 44 mg/dL (ref <150)
Total CHOL/HDL Ratio: 2.8 (calc) (ref <5.0)

## 2017-10-28 LAB — TSH: TSH: 1.04 mIU/L (ref 0.40–4.50)

## 2017-10-28 MED ORDER — HALOBETASOL PROPIONATE 0.05 % EX OINT: TOPICAL_OINTMENT | Freq: Two times a day (BID) | CUTANEOUS | 1 refills | Status: DC

## 2017-10-28 NOTE — Patient Instructions (Signed)
Use a dropper or use a cap to put peroxide, olive oil,mineral oil or canola oil in the effected ear- 2-3 times a week. Let it soak for 20-30 min then you can take a shower or use a baby bulb with warm water to wash out the ear wax. Do not use Qtips    Aim for 7+ servings of fruits and vegetables daily  80+ fluid ounces of water or unsweet tea for healthy kidneys  Limit animal fats in diet for cholesterol and heart health - choose grass fed whenever available  Aim for low stress - take time to unwind and care for your mental health  Aim for 150 min of moderate intensity exercise weekly for heart health, and weights twice weekly for bone health  Aim for 7-9 hours of sleep daily    Stool DNA Testing for Colon Cancer Why am I having this test? Colon cancer is the second-leading cause of cancer deaths in men and women. Testing for colon cancer before symptoms develop (screening) reduces your risk for this cancer. Stool DNA testing, also called the FIT-DNA test, is one method of screening for colon cancer. Colon cancer grows slowly, so finding the cancer early means a better chance for effective treatment. Colon cancer screening is recommended for everyone who is 37-30 years old. Your health care provider may recommend having this test every 3 years if:  You are 24-40 years of age.  You have no symptoms of colon cancer. Symptoms include rectal bleeding, weight loss, and changes in bowel habits.  You have an average risk of colon cancer. Average risk means: ? You do not have precancerous polyps. ? You do not have family or personal history of either colon cancer or a colon disease that increases your risk for colon cancer.  What is being tested? For the test, a sample of your stool (feces) is checked for blood and changes in DNA that could lead to cancer. Growths in your colon that are cancerous (malignant) or may become cancerous (precancerous polyps) bleed and shed cells. Blood and cells  can be picked up by stool as it passes through your colon. This test checks for blood cells as well as nine types of DNA (biomarkers) in three genes that have been linked to colon cancer and precancerous polyps. What kind of sample is taken? This test uses a stool sample that is collected when you have a bowel movement. How do I collect samples at home? You will be sent a stool sample collection kit in the mail. It will include instructions and everything you need to get the sample. Instructions may include these steps:  Store the kit in a dry place at room temperature until you are ready to collect the sample. Keep the kit away from heat and sunlight.  To collect the sample, place the collection device over the toilet.  Collect the sample according to the instructions that came with your kit.  After collecting a stool sample, follow instructions carefully regarding mixing the stool with a preservative and sealing the sample.  Send the sample to the lab in the postage-paid box that was included in the kit.  Your stool sample will be checked within 3 days. The results will be sent to your health care provider. How do I prepare for this test? There is no preparation required for this test. However, do not collect a stool sample if:  You have bleeding hemorrhoids.  You have any rectal bleeding.  You have a cut  on your hand or finger.  You have your menstrual period.  You have diarrhea.  How are the results reported? Your test results will be reported as either positive or negative. It is up to you to get your test results. Ask your health care provider, or the department that is doing the test, when your results will be ready. What do the results mean?  A positive result means that the test found abnormal DNA, blood cells, or both. If you have a positive result, you will need to have a follow-up exam of your colon done with a scope (colonoscopy).  A negative result means that no  blood or changes in DNA were found. This does not guarantee that you do not have colon cancer. Your health care provider may recommend that you have other screening tests. Talk with your health care provider to discuss your results, treatment options, and if necessary, the need for more tests. Talk with your health care provider if you have any questions about your results. This information is not intended to replace advice given to you by your health care provider. Make sure you discuss any questions you have with your health care provider. Document Released: 12/27/2015 Document Revised: 12/27/2015 Document Reviewed: 12/27/2015 Elsevier Interactive Patient Education  2018 [employer].

## 2017-11-02 DIAGNOSIS — D225 Melanocytic nevi of trunk: Secondary | ICD-10-CM | POA: Diagnosis not present

## 2017-11-02 DIAGNOSIS — L400 Psoriasis vulgaris: Secondary | ICD-10-CM | POA: Diagnosis not present

## 2017-11-02 DIAGNOSIS — L821 Other seborrheic keratosis: Secondary | ICD-10-CM | POA: Diagnosis not present

## 2017-11-02 DIAGNOSIS — D2271 Melanocytic nevi of right lower limb, including hip: Secondary | ICD-10-CM | POA: Diagnosis not present

## 2017-11-04 DIAGNOSIS — Z1211 Encounter for screening for malignant neoplasm of colon: Secondary | ICD-10-CM | POA: Diagnosis not present

## 2017-11-09 ENCOUNTER — Ambulatory Visit
Admission: RE | Admit: 2017-11-09 | Discharge: 2017-11-09 | Disposition: A | Discharge status: Home / Self Care | Payer: Medicare HMO | Source: Ambulatory Visit | Attending: Internal Medicine | Admitting: Internal Medicine

## 2017-11-09 DIAGNOSIS — Z1231 Encounter for screening mammogram for malignant neoplasm of breast: Secondary | ICD-10-CM | POA: Diagnosis not present

## 2017-11-11 DIAGNOSIS — H6121 Impacted cerumen, right ear: Secondary | ICD-10-CM | POA: Diagnosis not present

## 2017-11-18 DIAGNOSIS — R69 Illness, unspecified: Secondary | ICD-10-CM | POA: Diagnosis not present

## 2017-11-18 LAB — COLOGUARD: COLOGUARD: NEGATIVE

## 2017-11-19 ENCOUNTER — Encounter: Payer: Self-pay | Admitting: Adult Health

## 2018-01-18 DIAGNOSIS — R69 Illness, unspecified: Secondary | ICD-10-CM | POA: Diagnosis not present

## 2018-01-20 ENCOUNTER — Encounter: Payer: Self-pay | Admitting: Internal Medicine

## 2018-02-04 ENCOUNTER — Encounter: Payer: Self-pay | Admitting: Internal Medicine

## 2018-02-23 ENCOUNTER — Encounter: Payer: Self-pay | Admitting: Internal Medicine

## 2018-02-23 ENCOUNTER — Ambulatory Visit (INDEPENDENT_AMBULATORY_CARE_PROVIDER_SITE_OTHER): Payer: Medicare HMO | Admitting: Internal Medicine

## 2018-02-23 VITALS — BP 122/76 | HR 68 | Temp 97.9°F | Resp 16 | Ht 65.0 in | Wt 130.4 lb

## 2018-02-23 DIAGNOSIS — Z0000 Encounter for general adult medical examination without abnormal findings: Secondary | ICD-10-CM

## 2018-02-23 DIAGNOSIS — Z1212 Encounter for screening for malignant neoplasm of rectum: Secondary | ICD-10-CM

## 2018-02-23 DIAGNOSIS — E782 Mixed hyperlipidemia: Secondary | ICD-10-CM

## 2018-02-23 DIAGNOSIS — Z0001 Encounter for general adult medical examination with abnormal findings: Secondary | ICD-10-CM

## 2018-02-23 DIAGNOSIS — E559 Vitamin D deficiency, unspecified: Secondary | ICD-10-CM | POA: Diagnosis not present

## 2018-02-23 DIAGNOSIS — R7303 Prediabetes: Secondary | ICD-10-CM

## 2018-02-23 DIAGNOSIS — M8580 Other specified disorders of bone density and structure, unspecified site: Secondary | ICD-10-CM

## 2018-02-23 DIAGNOSIS — I10 Essential (primary) hypertension: Secondary | ICD-10-CM | POA: Diagnosis not present

## 2018-02-23 DIAGNOSIS — Z79899 Other long term (current) drug therapy: Secondary | ICD-10-CM | POA: Diagnosis not present

## 2018-02-23 DIAGNOSIS — Z136 Encounter for screening for cardiovascular disorders: Secondary | ICD-10-CM | POA: Diagnosis not present

## 2018-02-23 DIAGNOSIS — R7309 Other abnormal glucose: Secondary | ICD-10-CM | POA: Diagnosis not present

## 2018-02-23 DIAGNOSIS — R030 Elevated blood-pressure reading, without diagnosis of hypertension: Secondary | ICD-10-CM | POA: Diagnosis not present

## 2018-02-23 DIAGNOSIS — Z1211 Encounter for screening for malignant neoplasm of colon: Secondary | ICD-10-CM

## 2018-02-23 DIAGNOSIS — Z8249 Family history of ischemic heart disease and other diseases of the circulatory system: Secondary | ICD-10-CM | POA: Insufficient documentation

## 2018-02-23 DIAGNOSIS — N3000 Acute cystitis without hematuria: Secondary | ICD-10-CM

## 2018-02-23 NOTE — Patient Instructions (Signed)

## 2018-02-23 NOTE — Progress Notes (Signed)
[facility] Unk Pinto, M.D.     Madison Gonzalez. Silverio Lay, P.A.-C Liane Comber, [place] [address] [place] [address] Telephone [phone] Telefax [phone] Annual Screening/Preventative Visit & Comprehensive Evaluation &  Examination     This very nice 77 y.o. WWF  presents for a Screening /Preventative Visit & comprehensive evaluation and management of multiple medical co-morbidities.  Patient has been followed with labile HTN, HLD, Prediabetes  and Vitamin D Deficiency.      Patient's BP has been controlled at home and patient denies any cardiac symptoms as chest pain, palpitations, shortness of breath, dizziness or ankle swelling. Today's BP is at goal - 122/76.      Patient's hyperlipidemia is controlled with diet and medications. Patient denies myalgias or other medication SE's.  She admits recently off of her Zetia, but intends to restart it. Last lipids were at goal: Lab Results  Component Value Date   CHOL 168 10/28/2017   HDL 60 10/28/2017   LDLCALC 95 10/28/2017   TRIG 44 10/28/2017   CHOLHDL 2.8 10/28/2017      Patient has hx/o prediabetes  (A1c 5.8% / 2015)  and patient denies reactive hypoglycemic symptoms, visual blurring, diabetic polys or paresthesias. Last A1c was Normal & at goal: Lab Results  Component Value Date   HGBA1C 5.5 07/10/2017      Finally, patient has history of Vitamin D Deficiency ("34" / 2010)  and last Vitamin D was at goal: Lab Results  Component Value Date   VD25OH 58 07/10/2017   Current Outpatient Medications on File Prior to Visit  Medication Sig  . Calcium Carbonate-Vitamin D (CALCIUM + D PO) Take 1 tablet by mouth daily.  Marland Kitchen VITAMIN D3 5000 UNITS  Take 2 caps -TThSat  & take 1 cap on SunMWF  . gemfibrozil () 600 MG tablet TAKE ONE TABLET  TWICE DAILY   . ULTRAVATE 0.05 % oint Apply topically 2  times daily.  Marland Kitchen MAGNESIUM PO Take 1 tablet  daily.    . Multi-Vit w/Minerals Take 1 tablet  daily.  Marland Kitchen ZETIA 10 MG tablet Take 1 tablet daily- reports OFF as above   Allergies  Allergen Reactions  . Atorvastatin Other (See Comments)    myalgia  . Fosamax [Alendronate Sodium] Other (See Comments)    Myalgia   Past Medical History:  Diagnosis Date  . History of ITP   . Hyperlipidemia   . IBS (irritable bowel syndrome)   . Osteopenia   . Psoriasis    Health Maintenance  Topic Date Due  . TETANUS/TDAP  02/09/2019  . INFLUENZA VACCINE  Completed  . DEXA SCAN  Completed  . PNA vac Low Risk Adult  Completed   Immunization History  Administered Date(s) Administered  . Influenza, High Dose Seasonal PF 01/25/2014, 02/04/2017  . Influenza-Unspecified 12/21/2014, 01/22/2016, 01/18/2018  . PPD Test 03/14/2013  . Pneumococcal Conjugate-13 12/05/2015  . Pneumococcal Polysaccharide-23 02/08/2009, 06/30/2014  . Td 02/08/2009  . Zoster 03/29/2009   Cologard (-) Negative  11/18/2017 - due 3 yr f/u July 2022  Last MGM - 11/09/2017 - Negative  Past Surgical History:  Procedure Laterality Date  . ABDOMINAL HYSTERECTOMY    . APPENDECTOMY    . BREAST SURGERY Right    Biospy, benign  . EYE SURGERY Bilateral    Cataracts  . EYE SURGERY     Lense implants  . TUBAL LIGATION     Family History  Problem  Relation Age of Onset  . AAA (abdominal aortic aneurysm) Mother   . Hypertension Mother   . Hypertension Father   . Heart disease Father   . Hyperlipidemia Father   . Cancer Maternal Aunt        uterine   Social History   Tobacco Use  . Smoking status: Never Smoker  . Smokeless tobacco: Never Used  Substance Use Topics  . Alcohol use: Yes    Comment: occasionl  . Drug use: No    ROS Constitutional: Denies fever, chills, weight loss/gain, headaches, insomnia,  night sweats, and change in appetite. Does c/o fatigue. Eyes: Denies redness, blurred vision, diplopia, discharge, itchy, watery eyes.  ENT: Denies discharge, congestion,  post nasal drip, epistaxis, sore throat, earache, hearing loss, dental pain, Tinnitus, Vertigo, Sinus pain, snoring.  Cardio: Denies chest pain, palpitations, irregular heartbeat, syncope, dyspnea, diaphoresis, orthopnea, PND, claudication, edema Respiratory: denies cough, dyspnea, DOE, pleurisy, hoarseness, laryngitis, wheezing.  Gastrointestinal: Denies dysphagia, heartburn, reflux, water brash, pain, cramps, nausea, vomiting, bloating, diarrhea, constipation, hematemesis, melena, hematochezia, jaundice, hemorrhoids Genitourinary: Denies dysuria, frequency, urgency, nocturia, hesitancy, discharge, hematuria, flank pain Breast: Breast lumps, nipple discharge, bleeding.  Musculoskeletal: Denies arthralgia, myalgia, stiffness, Jt. Swelling, pain, limp, and strain/sprain. Denies falls. Skin: Denies puritis, rash, hives, warts, acne, eczema, changing in skin lesion Neuro: No weakness, tremor, incoordination, spasms, paresthesia, pain Psychiatric: Denies confusion, memory loss, sensory loss. Denies Depression. Endocrine: Denies change in weight, skin, hair change, nocturia, and paresthesia, diabetic polys, visual blurring, hyper / hypo glycemic episodes.  Heme/Lymph: No excessive bleeding, bruising, enlarged lymph nodes.  Physical Exam  BP 122/76   Pulse 68   Temp 97.9 F (36.6 C)   Resp 16   Ht 5\' 5"  (1.651 m)   Wt 130 lb 6.4 oz (59.1 kg)   BMI 21.70 kg/m   General Appearance: Well nourished, well groomed and in no apparent distress.  Eyes: PERRLA, EOMs, conjunctiva no swelling or erythema, normal fundi and vessels. Sinuses: No frontal/maxillary tenderness ENT/Mouth: EACs patent / TMs  nl. Nares clear without erythema, swelling, mucoid exudates. Oral hygiene is good. No erythema, swelling, or exudate. Tongue normal, non-obstructing. Tonsils not swollen or erythematous. Hearing normal.  Neck: Supple, thyroid not palpable. No bruits, nodes or JVD. Respiratory: Respiratory effort normal.  BS  equal and clear bilateral without rales, rhonci, wheezing or stridor. Cardio: Heart sounds are normal with regular rate and rhythm and no murmurs, rubs or gallops. Peripheral pulses are normal and equal bilaterally without edema. No aortic or femoral bruits. Chest: symmetric with normal excursions and percussion. Breasts: Symmetric, without lumps, nipple discharge, retractions, or fibrocystic changes.  Abdomen: Flat, soft with bowel sounds active. Nontender, no guarding, rebound, hernias, masses, or organomegaly.  Lymphatics: Non tender without lymphadenopathy.  Genitourinary:  Musculoskeletal: Full ROM all peripheral extremities, joint stability, 5/5 strength, and normal gait. Skin: Warm and dry without rashes, lesions, cyanosis, clubbing or  ecchymosis.  Neuro: Cranial nerves intact, reflexes equal bilaterally. Normal muscle tone, no cerebellar symptoms. Sensation intact.  Pysch: Alert and oriented X 3, normal affect, Insight and Judgment appropriate.   Assessment and Plan  1. Annual Preventative Screening Examination  2. Elevated BP w/o Dx HTN  - EKG 12-Lead - Urinalysis, Routine w reflex microscopic - Microalbumin / creatinine urine ratio - CBC with Differential/Platelet - COMPLETE METABOLIC PANEL WITH GFR - Magnesium - TSH  3. Hyperlipidemia, mixed  - Lipid panel - TSH  4. Abnormal glucose  - Hemoglobin A1c - Insulin, random  5. Vitamin D deficiency  - VITAMIN D 25 Hydroxyl  6. Prediabetes  - Hemoglobin A1c - Insulin, random  7. Osteopenia   8. Screening for colorectal cancer  - POC Hemoccult Bld/Stl  9. Screening for ischemic heart disease  - EKG 12-Lead - Lipid panel  10. FHx: heart disease   11. Medication management  - Urinalysis, Routine w reflex microscopic - Microalbumin / creatinine urine ratio - CBC with Differential/Platelet - COMPLETE METABOLIC PANEL WITH GFR - Magnesium - Lipid panel - TSH - Hemoglobin A1c - Insulin, random -  VITAMIN D 25 Hydroxyl      Patient was counseled in prudent diet to achieve/maintain BMI less than 25 for weight control, BP monitoring, regular exercise and medications. Discussed med's effects and SE's. Screening labs and tests as requested with regular follow-up as recommended. Over 40 minutes of exam, counseling, chart review and high complex critical decision making was performed.

## 2018-02-24 ENCOUNTER — Other Ambulatory Visit: Payer: Self-pay | Admitting: Internal Medicine

## 2018-02-24 DIAGNOSIS — N3000 Acute cystitis without hematuria: Secondary | ICD-10-CM

## 2018-02-24 LAB — URINALYSIS, ROUTINE W REFLEX MICROSCOPIC
Bacteria, UA: NONE SEEN /HPF
Bilirubin Urine: NEGATIVE
GLUCOSE, UA: NEGATIVE
HGB URINE DIPSTICK: NEGATIVE
KETONES UR: NEGATIVE
NITRITE: NEGATIVE
PH: 5.5 (ref 5.0–8.0)
Protein, ur: NEGATIVE
Specific Gravity, Urine: 1.022 (ref 1.001–1.03)

## 2018-02-24 LAB — LIPID PANEL
CHOL/HDL RATIO: 3.1 (calc) (ref <5.0)
Cholesterol: 195 mg/dL (ref <200)
HDL: 63 mg/dL (ref >50)
LDL CHOLESTEROL (CALC): 120 mg/dL — AB
Non-HDL Cholesterol (Calc): 132 mg/dL (calc) — ABNORMAL HIGH (ref <130)
Triglycerides: 41 mg/dL (ref <150)

## 2018-02-24 LAB — HEMOGLOBIN A1C
HEMOGLOBIN A1C: 5.5 %{Hb} (ref <5.7)
MEAN PLASMA GLUCOSE: 111 (calc)
eAG (mmol/L): 6.2 (calc)

## 2018-02-24 LAB — CBC WITH DIFFERENTIAL/PLATELET
BASOS ABS: 72 {cells}/uL (ref 0–200)
Basophils Relative: 1.3 %
EOS ABS: 99 {cells}/uL (ref 15–500)
EOS PCT: 1.8 %
HEMATOCRIT: 37.2 % (ref 35.0–45.0)
HEMOGLOBIN: 12.6 g/dL (ref 11.7–15.5)
Lymphs Abs: 1430 cells/uL (ref 850–3900)
MCH: 31 pg (ref 27.0–33.0)
MCHC: 33.9 g/dL (ref 32.0–36.0)
MCV: 91.6 fL (ref 80.0–100.0)
MPV: 11.5 fL (ref 7.5–12.5)
Monocytes Relative: 6.9 %
NEUTROS ABS: 3520 {cells}/uL (ref 1500–7800)
Neutrophils Relative %: 64 %
Platelets: 217 10*3/uL (ref 140–400)
RBC: 4.06 10*6/uL (ref 3.80–5.10)
RDW: 12.2 % (ref 11.0–15.0)
Total Lymphocyte: 26 %
WBC mixed population: 380 cells/uL (ref 200–950)
WBC: 5.5 10*3/uL (ref 3.8–10.8)

## 2018-02-24 LAB — MICROALBUMIN / CREATININE URINE RATIO
Creatinine, Urine: 168 mg/dL (ref 20–275)
MICROALB/CREAT RATIO: 8 ug/mg{creat} (ref <30)
Microalb, Ur: 1.3 mg/dL

## 2018-02-24 LAB — COMPLETE METABOLIC PANEL WITH GFR
AG Ratio: 1.8 (calc) (ref 1.0–2.5)
ALKALINE PHOSPHATASE (APISO): 81 U/L (ref 33–130)
ALT: 16 U/L (ref 6–29)
AST: 23 U/L (ref 10–35)
Albumin: 4.4 g/dL (ref 3.6–5.1)
BUN: 20 mg/dL (ref 7–25)
CO2: 30 mmol/L (ref 20–32)
CREATININE: 0.78 mg/dL (ref 0.60–0.93)
Calcium: 9.7 mg/dL (ref 8.6–10.4)
Chloride: 103 mmol/L (ref 98–110)
GFR, EST AFRICAN AMERICAN: 84 mL/min/{1.73_m2} (ref >=60)
GFR, EST NON AFRICAN AMERICAN: 73 mL/min/{1.73_m2} (ref >=60)
GLOBULIN: 2.4 g/dL (ref 1.9–3.7)
Glucose, Bld: 81 mg/dL (ref 65–99)
Potassium: 4.2 mmol/L (ref 3.5–5.3)
SODIUM: 139 mmol/L (ref 135–146)
TOTAL PROTEIN: 6.8 g/dL (ref 6.1–8.1)
Total Bilirubin: 0.5 mg/dL (ref 0.2–1.2)

## 2018-02-24 LAB — VITAMIN D 25 HYDROXY (VIT D DEFICIENCY, FRACTURES): Vit D, 25-Hydroxy: 66 ng/mL (ref 30–100)

## 2018-02-24 LAB — INSULIN, RANDOM: Insulin: 1.2 u[IU]/mL — ABNORMAL LOW (ref 2.0–19.6)

## 2018-02-24 LAB — TSH: TSH: 0.9 m[IU]/L (ref 0.40–4.50)

## 2018-02-24 LAB — MAGNESIUM: MAGNESIUM: 2 mg/dL (ref 1.5–2.5)

## 2018-02-25 DIAGNOSIS — N3000 Acute cystitis without hematuria: Secondary | ICD-10-CM | POA: Diagnosis not present

## 2018-02-25 NOTE — Addendum Note (Signed)
Addended by: Eulis Canner on: 02/25/2018 08:38 AM   Modules accepted: Orders

## 2018-02-26 LAB — URINE CULTURE
MICRO NUMBER:: 91343524
Result:: NO GROWTH
SPECIMEN QUALITY:: ADEQUATE

## 2018-03-12 ENCOUNTER — Other Ambulatory Visit: Payer: Self-pay | Admitting: Internal Medicine

## 2018-03-12 DIAGNOSIS — E782 Mixed hyperlipidemia: Secondary | ICD-10-CM

## 2018-05-05 DIAGNOSIS — H5712 Ocular pain, left eye: Secondary | ICD-10-CM | POA: Diagnosis not present

## 2018-05-05 DIAGNOSIS — S0502XA Injury of conjunctiva and corneal abrasion without foreign body, left eye, initial encounter: Secondary | ICD-10-CM | POA: Diagnosis not present

## 2018-05-05 DIAGNOSIS — H16102 Unspecified superficial keratitis, left eye: Secondary | ICD-10-CM | POA: Diagnosis not present

## 2018-05-26 DIAGNOSIS — R69 Illness, unspecified: Secondary | ICD-10-CM | POA: Diagnosis not present

## 2018-05-27 NOTE — Progress Notes (Signed)
FOLLOW UP  Assessment and Plan:   Elevated BP without diagnosis of HTN Intermittent rare elevated readings; normal in office today Monitor blood pressure at home; patient to call if consistently greater than 130/80 Continue DASH diet.   Reminder to go to the ER if any CP, SOB, nausea, dizziness, severe HA, changes vision/speech, left arm numbness and tingling and jaw pain.  Cholesterol Continue medications  Continue low cholesterol diet and exercise.  Check lipid panel.   Abnormal glucose Will not check this visit  Abnormal skin growth Will schedule removal   Continue diet and meds as discussed. Further disposition pending results of labs. Discussed med's effects and SE's.   Over 30 minutes of exam, counseling, chart review, and critical decision making was performed.   Future Appointments  Date Time Provider [place]  08/31/2018 10:30 AM Unk Pinto, MD [facility] None  03/21/2019  2:00 PM Unk Pinto, MD [facility] None    ----------------------------------------------------------------------------------------------------------------------  HPI 78 y.o. female  presents for 3 month follow up on BP (isolated elevated readings without diagnosis of htn), cholesterol, prediabetes and vitamin D deficiency.   She is going to visit her brother in [place] next month, he will be moving there from [place].  She has an area on her right inner leg that she has had for years but it has been rubbing and she would like it removed.  BMI is Body mass index is 22.86 kg/m., she has been working on diet and exercise. Wt Readings from Last 3 Encounters:  05/28/18 137 lb 6.4 oz (62.3 kg)  02/23/18 130 lb 6.4 oz (59.1 kg)  10/28/17 130 lb (59 kg)   Her blood pressure has been controlled at home, today their BP is BP: 128/80  She does workout. She denies chest pain, shortness of breath, dizziness.   She is on cholesterol medication, she is on zetia and lopid twice a  day and denies myalgias. Her cholesterol is at goal. The cholesterol last visit was:   Lab Results  Component Value Date   CHOL 195 02/23/2018   HDL 63 02/23/2018   LDLCALC 120 (H) 02/23/2018   TRIG 41 02/23/2018   CHOLHDL 3.1 02/23/2018   Last A1C in the office was:  Lab Results  Component Value Date   HGBA1C 5.5 02/23/2018   Patient is on Vitamin D supplement and was at goal at the last visit:   Lab Results  Component Value Date   VD25OH 66 02/23/2018       Current Medications:  Current Outpatient Medications on File Prior to Visit  Medication Sig  . Calcium Carbonate-Vitamin D (CALCIUM + D PO) Take 1 tablet by mouth daily.  . Cholecalciferol (VITAMIN D3) 5000 UNITS TABS Take 5,000-10,000 Units by mouth daily. Take 2 capsules (10,000 units) on Tuesday, Thursday, Saturday, take 1 capsule (5,000 units) on Sunday, Monday, Wednesday, Friday  . ezetimibe (ZETIA) 10 MG tablet TAKE 1 TABLET BY MOUTH ONCE DAILY FOR  HIGH  CHOLESTEROL  . gemfibrozil (LOPID) 600 MG tablet TAKE 1 TABLET BY MOUTH TWICE DAILY WITH A MEAL FOR CHOLESTEROL  . halobetasol (ULTRAVATE) 0.05 % ointment Apply topically 2 (two) times daily.  Marland Kitchen MAGNESIUM PO Take 1 tablet by mouth daily.   . Multiple Vitamin (MULTIVITAMIN WITH MINERALS) TABS tablet Take 1 tablet by mouth daily.   No current facility-administered medications on file prior to visit.      Allergies:  Allergies  Allergen Reactions  . Atorvastatin Other (See Comments)  myalgia  . Fosamax [Alendronate Sodium] Other (See Comments)    Myalgia     Medical History:  Past Medical History:  Diagnosis Date  . History of ITP   . Hyperlipidemia   . IBS (irritable bowel syndrome)   . Osteopenia   . Psoriasis    Immunization History  Administered Date(s) Administered  . Influenza, High Dose Seasonal PF 01/25/2014, 02/04/2017  . Influenza-Unspecified 12/21/2014, 01/22/2016, 01/18/2018  . PPD Test 03/14/2013  . Pneumococcal Conjugate-13  12/05/2015  . Pneumococcal Polysaccharide-23 02/08/2009, 06/30/2014  . Td 02/08/2009  . Zoster 03/29/2009    Family history- Reviewed and unchanged Social history- Reviewed and unchanged   Review of Systems:  Review of Systems  Constitutional: Negative for malaise/fatigue and weight loss.  HENT: Negative for hearing loss and tinnitus.   Eyes: Negative for blurred vision and double vision.  Respiratory: Negative for cough, shortness of breath and wheezing.   Cardiovascular: Negative for chest pain, palpitations, orthopnea, claudication and leg swelling.  Gastrointestinal: Negative for abdominal pain, blood in stool, constipation, diarrhea, heartburn, melena, nausea and vomiting.  Genitourinary: Negative.   Musculoskeletal: Negative for joint pain and myalgias.  Skin: Negative for rash.  Neurological: Negative for dizziness, tingling, sensory change, weakness and headaches.  Endo/Heme/Allergies: Negative for polydipsia.  Psychiatric/Behavioral: Negative.   All other systems reviewed and are negative.     Physical Exam: BP 128/80   Pulse 68   Temp (!) 97.5 F (36.4 C)   Ht 5\' 5"  (1.651 m)   Wt 137 lb 6.4 oz (62.3 kg)   SpO2 95%   BMI 22.86 kg/m  Wt Readings from Last 3 Encounters:  05/28/18 137 lb 6.4 oz (62.3 kg)  02/23/18 130 lb 6.4 oz (59.1 kg)  10/28/17 130 lb (59 kg)   General Appearance: Well nourished, in no apparent distress. Eyes: PERRLA, EOMs, conjunctiva no swelling or erythema Sinuses: No Frontal/maxillary tenderness ENT/Mouth: Ext aud canals clear, TMs without erythema, bulging. No erythema, swelling, or exudate on post pharynx.  Tonsils not swollen or erythematous. Hearing normal.  Neck: Supple, thyroid normal.  Respiratory: Respiratory effort normal, BS equal bilaterally without rales, rhonchi, wheezing or stridor.  Cardio: RRR with no MRGs. Brisk peripheral pulses without edema.  Abdomen: Soft, + BS.  Non tender, no guarding, rebound, hernias,  masses. Lymphatics: Non tender without lymphadenopathy.  Musculoskeletal: Full ROM, 5/5 strength, Normal gait Skin: Warm, dry without rashes, lesions, ecchymosis. See picture,  Neuro: Cranial nerves intact. No cerebellar symptoms.  Psych: Awake and oriented X 3, normal affect, Insight and Judgment appropriate.       Vicie Mutters, PA-C 10:22 AM [facility]

## 2018-05-28 ENCOUNTER — Ambulatory Visit (INDEPENDENT_AMBULATORY_CARE_PROVIDER_SITE_OTHER): Payer: Medicare HMO | Admitting: Physician Assistant

## 2018-05-28 ENCOUNTER — Encounter: Payer: Self-pay | Admitting: Physician Assistant

## 2018-05-28 VITALS — BP 128/80 | HR 68 | Temp 97.5°F | Ht 65.0 in | Wt 137.4 lb

## 2018-05-28 DIAGNOSIS — E782 Mixed hyperlipidemia: Secondary | ICD-10-CM | POA: Diagnosis not present

## 2018-05-28 DIAGNOSIS — R030 Elevated blood-pressure reading, without diagnosis of hypertension: Secondary | ICD-10-CM | POA: Diagnosis not present

## 2018-05-28 DIAGNOSIS — D492 Neoplasm of unspecified behavior of bone, soft tissue, and skin: Secondary | ICD-10-CM | POA: Diagnosis not present

## 2018-05-28 DIAGNOSIS — R7309 Other abnormal glucose: Secondary | ICD-10-CM | POA: Diagnosis not present

## 2018-05-28 LAB — COMPLETE METABOLIC PANEL WITH GFR
AG RATIO: 1.6 (calc) (ref 1.0–2.5)
ALKALINE PHOSPHATASE (APISO): 80 U/L (ref 37–153)
ALT: 16 U/L (ref 6–29)
AST: 18 U/L (ref 10–35)
Albumin: 4.5 g/dL (ref 3.6–5.1)
BILIRUBIN TOTAL: 0.7 mg/dL (ref 0.2–1.2)
BUN: 18 mg/dL (ref 7–25)
CALCIUM: 10 mg/dL (ref 8.6–10.4)
CO2: 29 mmol/L (ref 20–32)
Chloride: 104 mmol/L (ref 98–110)
Creat: 0.71 mg/dL (ref 0.60–0.93)
GFR, EST NON AFRICAN AMERICAN: 82 mL/min/{1.73_m2} (ref >=60)
GFR, Est African American: 95 mL/min/{1.73_m2} (ref >=60)
GLOBULIN: 2.8 g/dL (ref 1.9–3.7)
Glucose, Bld: 88 mg/dL (ref 65–99)
Potassium: 4.4 mmol/L (ref 3.5–5.3)
SODIUM: 142 mmol/L (ref 135–146)
TOTAL PROTEIN: 7.3 g/dL (ref 6.1–8.1)

## 2018-05-28 LAB — CBC WITH DIFFERENTIAL/PLATELET
ABSOLUTE MONOCYTES: 424 {cells}/uL (ref 200–950)
Basophils Absolute: 72 cells/uL (ref 0–200)
Basophils Relative: 1.3 %
EOS ABS: 171 {cells}/uL (ref 15–500)
Eosinophils Relative: 3.1 %
HEMATOCRIT: 38 % (ref 35.0–45.0)
Hemoglobin: 12.6 g/dL (ref 11.7–15.5)
LYMPHS ABS: 1650 {cells}/uL (ref 850–3900)
MCH: 30.1 pg (ref 27.0–33.0)
MCHC: 33.2 g/dL (ref 32.0–36.0)
MCV: 90.9 fL (ref 80.0–100.0)
MPV: 11.5 fL (ref 7.5–12.5)
Monocytes Relative: 7.7 %
NEUTROS PCT: 57.9 %
Neutro Abs: 3185 cells/uL (ref 1500–7800)
Platelets: 236 10*3/uL (ref 140–400)
RBC: 4.18 10*6/uL (ref 3.80–5.10)
RDW: 12.9 % (ref 11.0–15.0)
Total Lymphocyte: 30 %
WBC: 5.5 10*3/uL (ref 3.8–10.8)

## 2018-05-28 LAB — LIPID PANEL
CHOL/HDL RATIO: 2.7 (calc) (ref <5.0)
Cholesterol: 190 mg/dL (ref <200)
HDL: 70 mg/dL (ref >=50)
LDL CHOLESTEROL (CALC): 107 mg/dL — AB
Non-HDL Cholesterol (Calc): 120 mg/dL (calc) (ref <130)
Triglycerides: 51 mg/dL (ref <150)

## 2018-05-28 LAB — TSH: TSH: 1.21 m[IU]/L (ref 0.40–4.50)

## 2018-05-28 NOTE — Patient Instructions (Addendum)
Check out vionic shoes if you have flat feet  Will schedule removal of right feet   Ask insurance and pharmacy about shingrix - new vaccine   Can go to [email or web address] for more information  Shingrix Vaccination  Two vaccines are licensed and recommended to prevent shingles in the [place] Zoster vaccine live (ZVL, Zostavax) has been in use since 2006. Recombinant zoster vaccine (RZV, Shingrix), has been in use since 2017 and is recommended by [employer] as the preferred shingles vaccine.  What Everyone Should Know about Shingles Vaccine (Shingrix) One of the Recommended Vaccines by Disease Shingles vaccination is the only way to protect against shingles and postherpetic neuralgia (PHN), the most common complication from shingles. CDC recommends that healthy adults 50 years and older get two doses of the shingles vaccine called Shingrix (recombinant zoster vaccine), separated by 2 to 6 months, to prevent shingles and the complications from the disease. Your doctor or pharmacist can give you Shingrix as a shot in your upper arm. Shingrix provides strong protection against shingles and PHN. Two doses of Shingrix is more than 90% effective at preventing shingles and PHN. Protection stays above 85% for at least the first four years after you get vaccinated. Shingrix is the preferred vaccine, over Zostavax (zoster vaccine live), a shingles vaccine in use since 2006. Zostavax may still be used to prevent shingles in healthy adults 60 years and older. For example, you could use Zostavax if a person is allergic to Shingrix, prefers Zostavax, or requests immediate vaccination and Shingrix is unavailable. Who Should Get Shingrix? Healthy adults 50 years and older should get two doses of Shingrix, separated by 2 to 6 months. You should get Shingrix even if in the past you . had shingles  . received Zostavax  . are not sure if you had chickenpox There is no  maximum age for getting Shingrix. If you had shingles in the past, you can get Shingrix to help prevent future occurrences of the disease. There is no specific length of time that you need to wait after having shingles before you can receive Shingrix, but generally you should make sure the shingles rash has gone away before getting vaccinated. You can get Shingrix whether or not you remember having had chickenpox in the past. Studies show that more than 99% of Americans 40 years and older have had chickenpox, even if they don't remember having the disease. Chickenpox and shingles are related because they are caused by the same virus (varicella zoster virus). After a person recovers from chickenpox, the virus stays dormant (inactive) in the body. It can reactivate years later and cause shingles. If you had Zostavax in the recent past, you should wait at least eight weeks before getting Shingrix. Talk to your healthcare provider to determine the best time to get Shingrix. Shingrix is available in [employer] and pharmacies. To find doctor's offices or pharmacies near you that offer the vaccine, visit [employer] Vaccine FinderExternal. If you have questions about Shingrix, talk with your healthcare provider. Vaccine for Those 11 Years and Older  Shingrix reduces the risk of shingles and PHN by more than 90% in people 51 and older. CDC recommends the vaccine for healthy adults 48 and older.  Who Should Not Get Shingrix? You should not get Shingrix if you: . have ever had a severe allergic reaction to any component of the vaccine or after a dose of Shingrix  . tested negative for immunity to varicella zoster virus. If you test negative, you should  get chickenpox vaccine.  . currently have shingles  . currently are pregnant or breastfeeding. Women who are pregnant or breastfeeding should wait to get Shingrix.  Marland Kitchen receive specific antiviral drugs (acyclovir, famciclovir, or valacyclovir) 24 hours before  vaccination (avoid use of these antiviral drugs for 14 days after vaccination)- zoster vaccine live only If you have a minor acute (starts suddenly) illness, such as a cold, you may get Shingrix. But if you have a moderate or severe acute illness, you should usually wait until you recover before getting the vaccine. This includes anyone with a temperature of 101.20F or higher. The side effects of the Shingrix are temporary, and usually last 2 to 3 days. While you may experience pain for a few days after getting Shingrix, the pain will be less severe than having shingles and the complications from the disease. How Well Does Shingrix Work? Two doses of Shingrix provides strong protection against shingles and postherpetic neuralgia (PHN), the most common complication of shingles. . In adults 63 to 79 years old who got two doses, Shingrix was 97% effective in preventing shingles; among adults 70 years and older, Shingrix was 91% effective.  . In adults 44 to 80 years old who got two doses, Shingrix was 91% effective in preventing PHN; among adults 70 years and older, Shingrix was 89% effective. Shingrix protection remained high (more than 85%) in people 70 years and older throughout the four years following vaccination. Since your risk of shingles and PHN increases as you get older, it is important to have strong protection against shingles in your older years. Top of Page  What Are the Possible Side Effects of Shingrix? Studies show that Shingrix is safe. The vaccine helps your body create a strong defense against shingles. As a result, you are likely to have temporary side effects from getting the shots. The side effects may affect your ability to do normal daily activities for 2 to 3 days. Most people got a sore arm with mild or moderate pain after getting Shingrix, and some also had redness and swelling where they got the shot. Some people felt tired, had muscle pain, a headache, shivering, fever, stomach  pain, or nausea. About 1 out of 6 people who got Shingrix experienced side effects that prevented them from doing regular activities. Symptoms went away on their own in about 2 to 3 days. Side effects were more common in younger people. You might have a reaction to the first or second dose of Shingrix, or both doses. If you experience side effects, you may choose to take over-the-counter pain medicine such as ibuprofen or acetaminophen. If you experience side effects from Shingrix, you should report them to the Vaccine Adverse Event Reporting System (VAERS). Your doctor might file this report, or you can do it yourself through the VAERS websiteExternal, or by calling [phone]. If you have any questions about side effects from Shingrix, talk with your doctor. The shingles vaccine does not contain thimerosal (a preservative containing mercury). Top of Page  When Should I See a Doctor Because of the Side Effects I Experience From Shingrix? In clinical trials, Shingrix was not associated with serious adverse events. In fact, serious side effects from vaccines are extremely rare. For example, for every 1 million doses of a vaccine given, only one or two people may have a severe allergic reaction. Signs of an allergic reaction happen within minutes or hours after vaccination and include hives, swelling of the face and throat, difficulty breathing, a fast  heartbeat, dizziness, or weakness. If you experience these or any other life-threatening symptoms, see a doctor right away. Shingrix causes a strong response in your immune system, so it may produce short-term side effects more intense than you are used to from other vaccines. These side effects can be uncomfortable, but they are expected and usually go away on their own in 2 or 3 days. Top of Page  How Can I Pay For Shingrix? There are several ways shingles vaccine may be paid for: Medicare . Medicare Part D plans cover the shingles vaccine, but there  may be a cost to you depending on your plan. There may be a copay for the vaccine, or you may need to pay in full then get reimbursed for a certain amount.  . Medicare Part B does not cover the shingles vaccine. Medicaid . Medicaid may or may not cover the vaccine. Contact your insurer to find out. Private health insurance . Many private health insurance plans will cover the vaccine, but there may be a cost to you depending on your plan. Contact your insurer to find out. Vaccine assistance programs . Some pharmaceutical companies provide vaccines to eligible adults who cannot afford them. You may want to check with the vaccine manufacturer, [employer], about Shingrix. If you do not currently have health insurance, learn more about affordable health coverage optionsExternal. To find doctor's offices or pharmacies near you that offer the vaccine, visit [facility] Vaccine FinderExternal.

## 2018-05-31 DIAGNOSIS — D485 Neoplasm of uncertain behavior of skin: Secondary | ICD-10-CM | POA: Diagnosis not present

## 2018-05-31 DIAGNOSIS — B079 Viral wart, unspecified: Secondary | ICD-10-CM | POA: Diagnosis not present

## 2018-06-23 ENCOUNTER — Encounter: Payer: Self-pay | Admitting: Adult Health

## 2018-08-30 ENCOUNTER — Encounter: Payer: Self-pay | Admitting: Internal Medicine

## 2018-08-30 DIAGNOSIS — H43813 Vitreous degeneration, bilateral: Secondary | ICD-10-CM | POA: Diagnosis not present

## 2018-08-30 DIAGNOSIS — H35371 Puckering of macula, right eye: Secondary | ICD-10-CM | POA: Diagnosis not present

## 2018-08-30 DIAGNOSIS — Z961 Presence of intraocular lens: Secondary | ICD-10-CM | POA: Diagnosis not present

## 2018-08-30 DIAGNOSIS — H5213 Myopia, bilateral: Secondary | ICD-10-CM | POA: Diagnosis not present

## 2018-08-30 NOTE — Patient Instructions (Signed)

## 2018-08-30 NOTE — Progress Notes (Addendum)
History of Present Illness:      This very nice 78 y.o. WWF presents for 6 month follow up with HTN, HLD, Pre-Diabetes and Vitamin D Deficiency.       Patient is monitored expectantly for labile  HTN & BP has been controlled at home. Today's BP: 118/74. Patient has had no complaints of any cardiac type chest pain, palpitations, dyspnea / orthopnea / PND, dizziness, claudication, or dependent edema. BP Readings from Last 3 Encounters:  08/31/18 118/74  05/28/18 128/80  02/23/18 122/76       Hyperlipidemia is controlled with diet & meds. Patient denies myalgias or other med SE's. Last Lipids were not at goal: Lab Results  Component Value Date   CHOL 190 05/28/2018   HDL 70 05/28/2018   LDLCALC 107 (H) 05/28/2018   TRIG 51 05/28/2018   CHOLHDL 2.7 05/28/2018       Also, the patient has history of PreDiabetes(A1c 5.8% / 2015) and has had no symptoms of reactive hypoglycemia, diabetic polys, paresthesias or visual blurring.  Last A1c was Normal & at goal: Lab Results  Component Value Date   HGBA1C 5.5 02/23/2018      Further, the patient also has history of Vitamin D Deficiency ("34" / 2010) and supplements vitamin D without any suspected side-effects. Last vitamin D was at goal: Lab Results  Component Value Date   VD25OH 66 02/23/2018    Current Outpatient Medications on File Prior to Visit  Medication Sig  . Calcium Carbonate-Vitamin D (CALCIUM + D PO) Take 1 tablet by mouth daily.  . Cholecalciferol (VITAMIN D3) 5000 UNITS TABS Take 5,000-10,000 Units by mouth daily. Take 2 capsules (10,000 units) on Tuesday, Thursday, Saturday, take 1 capsule (5,000 units) on Sunday, Monday, Wednesday, Friday  . ezetimibe (ZETIA) 10 MG tablet TAKE 1 TABLET BY MOUTH ONCE DAILY FOR  HIGH  CHOLESTEROL  . gemfibrozil (LOPID) 600 MG tablet TAKE 1 TABLET BY MOUTH TWICE DAILY WITH A MEAL FOR CHOLESTEROL  . halobetasol (ULTRAVATE) 0.05 % ointment Apply topically 2 (two) times daily.  Marland Kitchen MAGNESIUM PO  Take 1 tablet by mouth daily.   . Multiple Vitamin (MULTIVITAMIN WITH MINERALS) TABS tablet Take 1 tablet by mouth daily.   No current facility-administered medications on file prior to visit.    Allergies  Allergen Reactions  . Atorvastatin Other (See Comments)    myalgia  . Fosamax [Alendronate Sodium] Other (See Comments)    Myalgia   PMHx:   Past Medical History:  Diagnosis Date  . History of ITP   . Hyperlipidemia   . IBS (irritable bowel syndrome)   . Osteopenia   . Psoriasis    Immunization History  Administered Date(s) Administered  . Influenza, High Dose Seasonal PF 01/25/2014, 02/04/2017  . Influenza-Unspecified 12/21/2014, 01/22/2016, 01/18/2018  . PPD Test 03/14/2013  . Pneumococcal Conjugate-13 12/05/2015  . Pneumococcal Polysaccharide-23 02/08/2009, 06/30/2014  . Td 02/08/2009  . Zoster 03/29/2009   Past Surgical History:  Procedure Laterality Date  . ABDOMINAL HYSTERECTOMY    . APPENDECTOMY    . BREAST SURGERY Right    Biospy, benign  . EYE SURGERY Bilateral    Cataracts  . EYE SURGERY     Lense implants  . TUBAL LIGATION     FHx:    Reviewed / unchanged  SHx:    Reviewed / unchanged   Systems Review:  Constitutional: Denies fever, chills, wt changes, headaches, insomnia, fatigue, night sweats, change in appetite. Eyes: Denies redness, blurred vision,  diplopia, discharge, itchy, watery eyes.  ENT: Denies discharge, congestion, post nasal drip, epistaxis, sore throat, earache, hearing loss, dental pain, tinnitus, vertigo, sinus pain, snoring.  CV: Denies chest pain, palpitations, irregular heartbeat, syncope, dyspnea, diaphoresis, orthopnea, PND, claudication or edema. Respiratory: denies cough, dyspnea, DOE, pleurisy, hoarseness, laryngitis, wheezing.  Gastrointestinal: Denies dysphagia, odynophagia, heartburn, reflux, water brash, abdominal pain or cramps, nausea, vomiting, bloating, diarrhea, constipation, hematemesis, melena, hematochezia  or  hemorrhoids. Genitourinary: Denies dysuria, frequency, urgency, nocturia, hesitancy, discharge, hematuria or flank pain. Musculoskeletal: Denies arthralgias, myalgias, stiffness, jt. swelling, pain, limping or strain/sprain.  Skin: Denies pruritus, rash, hives, warts, acne, eczema or change in skin lesion(s). Neuro: No weakness, tremor, incoordination, spasms, paresthesia or pain. Psychiatric: Denies confusion, memory loss or sensory loss. Endo: Denies change in weight, skin or hair change.  Heme/Lymph: No excessive bleeding, bruising or enlarged lymph nodes.  Physical Exam  BP 118/74   Pulse 72   Temp 97.7 F (36.5 C)   Resp 16   Ht 5\' 5"  (1.651 m)   Wt 136 lb 9.6 oz (62 kg)   BMI 22.73 kg/m   Appears  well nourished, well groomed  and in no distress.  Eyes: PERRLA, EOMs, conjunctiva no swelling or erythema. Sinuses: No frontal/maxillary tenderness ENT/Mouth: EAC's clear, TM's nl w/o erythema, bulging. Nares clear w/o erythema, swelling, exudates. Oropharynx clear without erythema or exudates. Oral hygiene is good. Tongue normal, non obstructing. Hearing intact.  Neck: Supple. Thyroid not palpable. Car 2+/2+ without bruits, nodes or JVD. Chest: Respirations nl with BS clear & equal w/o rales, rhonchi, wheezing or stridor.  Cor: Heart sounds normal w/ regular rate and rhythm without sig. murmurs, gallops, clicks or rubs. Peripheral pulses normal and equal  without edema.  Abdomen: Soft & bowel sounds normal. Non-tender w/o guarding, rebound, hernias, masses or organomegaly.  Lymphatics: Unremarkable.  Musculoskeletal: Full ROM all peripheral extremities, joint stability, 5/5 strength and normal gait.  Skin: Warm, dry without exposed rashes, lesions or ecchymosis apparent.  Neuro: Cranial nerves intact, reflexes equal bilaterally. Sensory-motor testing grossly intact. Tendon reflexes grossly intact.  Pysch: Alert & oriented x 3.  Insight and judgement nl & appropriate. No ideations.   Assessment and Plan:  1. Elevated BP w/o Dx HTN  - Continue medication, monitor blood pressure at home.  - Continue DASH diet.  Reminder to go to the ER if any CP,  SOB, nausea, dizziness, severe HA, changes vision/speech.  - CBC with Differential/Platelet - COMPLETE METABOLIC PANEL WITH GFR - Magnesium - TSH  2. Hyperlipidemia, mixed  - Continue diet/meds, exercise,& lifestyle modifications.  - Continue monitor periodic cholesterol/liver & renal functions   - Lipid panel - TSH  3. Abnormal glucose  - Continue diet, exercise  - Lifestyle modifications.  - Monitor appropriate labs.  - Hemoglobin A1c - Insulin, random  4. Vitamin D deficiency  - Continue supplementation.  - VITAMIN D 25 Hydroxyl  5. Prediabetes  - Hemoglobin A1c - Insulin, random  6. Medication management  - CBC with Differential/Platelet - COMPLETE METABOLIC PANEL WITH GFR - Magnesium - Lipid panel - TSH - Hemoglobin A1c - Insulin, random - VITAMIN D 25 Hydroxyl        Discussed  regular exercise, BP monitoring, weight control to achieve/maintain BMI less than 25 and discussed med and SE's. Recommended labs to assess and monitor clinical status with further disposition pending results of labs. I discussed the assessment and treatment plan with the patient. The patient was provided an opportunity  to ask questions and all were answered. The patient agreed with the plan and demonstrated an understanding of the instructions. Over 30 minutes of exam, counseling, chart review was performed.   Kirtland Bouchard, MD

## 2018-08-31 ENCOUNTER — Encounter: Payer: Self-pay | Admitting: Internal Medicine

## 2018-08-31 ENCOUNTER — Other Ambulatory Visit: Payer: Self-pay

## 2018-08-31 ENCOUNTER — Ambulatory Visit (INDEPENDENT_AMBULATORY_CARE_PROVIDER_SITE_OTHER): Payer: Medicare HMO | Admitting: Internal Medicine

## 2018-08-31 VITALS — BP 118/74 | HR 72 | Temp 97.7°F | Resp 16 | Ht 65.0 in | Wt 136.6 lb

## 2018-08-31 DIAGNOSIS — Z79899 Other long term (current) drug therapy: Secondary | ICD-10-CM

## 2018-08-31 DIAGNOSIS — R030 Elevated blood-pressure reading, without diagnosis of hypertension: Secondary | ICD-10-CM | POA: Diagnosis not present

## 2018-08-31 DIAGNOSIS — R7309 Other abnormal glucose: Secondary | ICD-10-CM | POA: Diagnosis not present

## 2018-08-31 DIAGNOSIS — E782 Mixed hyperlipidemia: Secondary | ICD-10-CM

## 2018-08-31 DIAGNOSIS — E559 Vitamin D deficiency, unspecified: Secondary | ICD-10-CM

## 2018-08-31 DIAGNOSIS — R7303 Prediabetes: Secondary | ICD-10-CM | POA: Diagnosis not present

## 2018-09-01 LAB — COMPLETE METABOLIC PANEL WITH GFR
AG Ratio: 1.5 (calc) (ref 1.0–2.5)
ALT: 15 U/L (ref 6–29)
AST: 20 U/L (ref 10–35)
Albumin: 4 g/dL (ref 3.6–5.1)
Alkaline phosphatase (APISO): 92 U/L (ref 37–153)
BUN: 18 mg/dL (ref 7–25)
CO2: 27 mmol/L (ref 20–32)
Calcium: 9.4 mg/dL (ref 8.6–10.4)
Chloride: 105 mmol/L (ref 98–110)
Creat: 0.82 mg/dL (ref 0.60–0.93)
GFR, Est African American: 79 mL/min/{1.73_m2} (ref >=60)
GFR, Est Non African American: 68 mL/min/{1.73_m2} (ref >=60)
Globulin: 2.7 g/dL (calc) (ref 1.9–3.7)
Glucose, Bld: 87 mg/dL (ref 65–99)
Potassium: 4.6 mmol/L (ref 3.5–5.3)
Sodium: 140 mmol/L (ref 135–146)
Total Bilirubin: 0.6 mg/dL (ref 0.2–1.2)
Total Protein: 6.7 g/dL (ref 6.1–8.1)

## 2018-09-01 LAB — CBC WITH DIFFERENTIAL/PLATELET
Absolute Monocytes: 494 cells/uL (ref 200–950)
Basophils Absolute: 61 cells/uL (ref 0–200)
Basophils Relative: 1 %
Eosinophils Absolute: 128 cells/uL (ref 15–500)
Eosinophils Relative: 2.1 %
HCT: 35.6 % (ref 35.0–45.0)
Hemoglobin: 11.8 g/dL (ref 11.7–15.5)
Lymphs Abs: 1543 cells/uL (ref 850–3900)
MCH: 30.2 pg (ref 27.0–33.0)
MCHC: 33.1 g/dL (ref 32.0–36.0)
MCV: 91 fL (ref 80.0–100.0)
MPV: 11 fL (ref 7.5–12.5)
Monocytes Relative: 8.1 %
Neutro Abs: 3874 cells/uL (ref 1500–7800)
Neutrophils Relative %: 63.5 %
Platelets: 272 10*3/uL (ref 140–400)
RBC: 3.91 10*6/uL (ref 3.80–5.10)
RDW: 12.7 % (ref 11.0–15.0)
Total Lymphocyte: 25.3 %
WBC: 6.1 10*3/uL (ref 3.8–10.8)

## 2018-09-01 LAB — LIPID PANEL
Cholesterol: 167 mg/dL (ref <200)
HDL: 44 mg/dL — ABNORMAL LOW (ref >=50)
LDL Cholesterol (Calc): 110 mg/dL (calc) — ABNORMAL HIGH
Non-HDL Cholesterol (Calc): 123 mg/dL (calc) (ref <130)
Total CHOL/HDL Ratio: 3.8 (calc) (ref <5.0)
Triglycerides: 51 mg/dL (ref <150)

## 2018-09-01 LAB — MAGNESIUM: Magnesium: 2 mg/dL (ref 1.5–2.5)

## 2018-09-01 LAB — VITAMIN D 25 HYDROXY (VIT D DEFICIENCY, FRACTURES): Vit D, 25-Hydroxy: 85 ng/mL (ref 30–100)

## 2018-09-01 LAB — HEMOGLOBIN A1C
Hgb A1c MFr Bld: 5.6 % of total Hgb (ref <5.7)
Mean Plasma Glucose: 114 (calc)
eAG (mmol/L): 6.3 (calc)

## 2018-09-01 LAB — INSULIN, RANDOM: Insulin: 2.9 u[IU]/mL

## 2018-09-01 LAB — TSH: TSH: 1.18 mIU/L (ref 0.40–4.50)

## 2018-10-12 ENCOUNTER — Other Ambulatory Visit: Payer: Self-pay | Admitting: Internal Medicine

## 2018-10-12 DIAGNOSIS — Z1231 Encounter for screening mammogram for malignant neoplasm of breast: Secondary | ICD-10-CM

## 2018-11-03 DIAGNOSIS — D692 Other nonthrombocytopenic purpura: Secondary | ICD-10-CM | POA: Diagnosis not present

## 2018-11-03 DIAGNOSIS — L821 Other seborrheic keratosis: Secondary | ICD-10-CM | POA: Diagnosis not present

## 2018-11-03 DIAGNOSIS — L400 Psoriasis vulgaris: Secondary | ICD-10-CM | POA: Diagnosis not present

## 2018-11-03 DIAGNOSIS — D224 Melanocytic nevi of scalp and neck: Secondary | ICD-10-CM | POA: Diagnosis not present

## 2018-11-03 DIAGNOSIS — D225 Melanocytic nevi of trunk: Secondary | ICD-10-CM | POA: Diagnosis not present

## 2018-11-22 ENCOUNTER — Ambulatory Visit
Admission: RE | Admit: 2018-11-22 | Discharge: 2018-11-22 | Disposition: A | Discharge status: Home / Self Care | Payer: Medicare HMO | Source: Ambulatory Visit | Attending: Internal Medicine | Admitting: Internal Medicine

## 2018-11-22 ENCOUNTER — Other Ambulatory Visit: Payer: Self-pay

## 2018-11-22 DIAGNOSIS — Z1231 Encounter for screening mammogram for malignant neoplasm of breast: Secondary | ICD-10-CM

## 2018-11-30 DIAGNOSIS — R69 Illness, unspecified: Secondary | ICD-10-CM | POA: Diagnosis not present

## 2018-11-30 NOTE — Progress Notes (Signed)
Patient ID: Madison Gonzalez, female   DOB: May 31, 1940, 78 y.o.   MRN: [id number]  MEDICARE ANNUAL WELLNESS VISIT AND FOLLOW UP  Assessment:    Encounter for Medicare annual wellness exam 1 year  Irritable bowel syndrome, unspecified type Controlled at this time without flare, takes imodium PRN   Psoriasis -  better at this time  Osteopenia, unspecified location Weight bearing exercises, high calcium diet, supplement vitamin D Repeat DEXA with MGM  Vitamin D deficiency At goal at recent check; continue to recommend supplementation for goal of 70-100  Prediabetes Recent A1Cs at goal Discussed diet/exercise, weight management  Defer A1C; check BMP  Medication management  Hyperlipidemia Continue medications Continue low cholesterol diet and exercise.  Check lipid panel.   History of ITP Monitor  Elevated BP w/o Dx HTN Monitor blood pressure at home; call if consistently over 130/80 Continue DASH diet.   Reminder to go to the ER if any CP, SOB, nausea, dizziness, severe HA, changes vision/speech, left arm numbness and tingling and jaw pain.  Body mass index (BMI) of 23.0-23.9 in adult Continue to recommend diet heavy in fruits and veggies and low in animal meats, cheeses, and dairy products, appropriate calorie intake Discuss exercise recommendations routinely Continue to monitor weight at each visit    Over 30 minutes of exam, counseling, chart review, and critical decision making was performed  Future Appointments  Date Time Provider [place]  03/21/2019  2:00 PM Unk Pinto, MD [facility] None     Plan:   During the course of the visit the patient was educated and counseled about appropriate screening and preventive services including:    Pneumococcal vaccine   Influenza vaccine  Td vaccine  Prevnar 13  Screening electrocardiogram  Screening mammography  Bone densitometry screening  Colorectal cancer screening  Diabetes  screening  Glaucoma screening  Nutrition counseling   Advanced directives: given info/requested copies   Subjective:   Madison Gonzalez is a 78 y.o. female who presents for Medicare Annual Wellness Visit and 3 month follow up on hypertension, glucose management, hyperlipidemia, vitamin D def.   [place], just resealed her driveway, stays very active on her land.   BMI is Body mass index is 22.27 kg/m., she has been working on diet and exercise.  Wt Readings from Last 3 Encounters:  12/02/18 133 lb 12.8 oz (60.7 kg)  08/31/18 136 lb 9.6 oz (62 kg)  05/28/18 137 lb 6.4 oz (62.3 kg)   Her blood pressure has been controlled at home, today their BP is BP: 120/70 She does not workout. She denies chest pain, shortness of breath, dizziness.   She is on cholesterol medication (zetia, lopid) and denies myalgias. Her cholesterol is not at goal. The cholesterol last visit was:   Lab Results  Component Value Date   CHOL 167 08/31/2018   HDL 44 (L) 08/31/2018   LDLCALC 110 (H) 08/31/2018   TRIG 51 08/31/2018   CHOLHDL 3.8 08/31/2018   She has been working on diet and exercise for glucose management, and denies foot ulcerations, hyperglycemia, hypoglycemia , increased appetite, nausea, paresthesia of the feet, polydipsia, polyuria, visual disturbances, vomiting and weight loss. Last A1C in the office was:  Lab Results  Component Value Date   HGBA1C 5.6 08/31/2018   Last GFR Lab Results  Component Value Date   GFRNONAA 68 08/31/2018   Patient is on Vitamin D supplement. Lab Results  Component Value  Date   VD25OH 27 08/31/2018      Medication Review Current Outpatient Medications on File Prior to Visit  Medication Sig Dispense Refill  . Calcium Carbonate-Vitamin D (CALCIUM + D PO) Take 1 tablet by mouth daily.    . Cholecalciferol (VITAMIN D3) 5000 UNITS TABS Take 5,000-10,000 Units by mouth daily. Take 2 capsules (10,000 units)  on Tuesday, Thursday, Saturday, take 1 capsule (5,000 units) on Sunday, Monday, Wednesday, Friday    . ezetimibe (ZETIA) 10 MG tablet TAKE 1 TABLET BY MOUTH ONCE DAILY FOR  HIGH  CHOLESTEROL 90 tablet 3  . gemfibrozil (LOPID) 600 MG tablet TAKE 1 TABLET BY MOUTH TWICE DAILY WITH A MEAL FOR CHOLESTEROL 180 tablet 3  . halobetasol (ULTRAVATE) 0.05 % ointment Apply topically 2 (two) times daily. 50 g 1  . MAGNESIUM PO Take 1 tablet by mouth daily.     . Multiple Vitamin (MULTIVITAMIN WITH MINERALS) TABS tablet Take 1 tablet by mouth daily.     No current facility-administered medications on file prior to visit.     Current Problems (verified) Patient Active Problem List   Diagnosis Date Noted  . FHx: heart disease 02/23/2018  . Body mass index (BMI) of 23.0-23.9 in adult 03/31/2015  . Abnormal glucose 03/29/2015  . Encounter for general adult medical examination with abnormal findings 01/25/2014  . Elevated BP w/o Dx HTN 06/29/2013  . Hyperlipidemia, mixed 06/29/2013  . Vitamin D deficiency 06/29/2013  . IBS (irritable bowel syndrome)   . Psoriasis   . Osteopenia   . History of ITP     Screening Tests Immunization History  Administered Date(s) Administered  . Influenza, High Dose Seasonal PF 01/25/2014, 01/22/2017  . Influenza,inj,quad, With Preservative 01/19/2017  . Influenza-Unspecified 12/21/2014, 01/22/2016, 01/18/2018  . PPD Test 03/14/2013  . Pneumococcal Conjugate-13 12/05/2015  . Pneumococcal Polysaccharide-23 02/08/2009, 06/30/2014  . Td 02/08/2009  . Zoster 03/29/2009    Preventative care: Last colonoscopy: refuses  Cologuard: 10/2017 Last mammogram:  11/2018 DEXA: 12/2014  CXR 2016  Prior vaccinations: TD or Tdap: 2010 she is due  Influenza:  2019  Pneumococcal: 2016 Prevnar13: 2017 Shingles/Zostavax: 2016  Names of Other Physician/Practitioners you currently use: 1. [facility]- here for primary care 2. Dr.  Kathrin Penner, eye doctor, last visit May 2020 3. [place], dentist, last visit 2020, q66m  Patient Care Team: Unk Pinto, MD as PCP - General (Internal Medicine) Shon Hough, MD as Consulting Physician (Ophthalmology) Allyn Kenner, MD (Dermatology)  Past Surgical History:  Procedure Laterality Date  . ABDOMINAL HYSTERECTOMY    . APPENDECTOMY    . BREAST SURGERY Right    Biospy, benign  . EYE SURGERY Bilateral    Cataracts  . EYE SURGERY     Lense implants  . TUBAL LIGATION     Family History  Problem Relation Age of Onset  . AAA (abdominal aortic aneurysm) Mother   . Hypertension Mother   . Hypertension Father   . Heart disease Father   . Hyperlipidemia Father   . Cancer Maternal Aunt        uterine   Social History   Tobacco Use  . Smoking status: Never Smoker  . Smokeless tobacco: Never Used  Substance Use Topics  . Alcohol use: Yes    Comment: occasionl  . Drug use: No    MEDICARE WELLNESS OBJECTIVES: Tobacco use: She does not smoke.  Patient is not a former smoker. If yes, counseling given Alcohol Current alcohol use:  social drinker Osteoporosis: postmenopausal estrogen deficiency, History of fracture in the past year: no Fall risk: High Risk Hearing: normal Visual acuity: normal,  does perform annual eye exam Diet: well balanced Physical activity: Current Exercise Habits: Home exercise routine Cardiac risk factors: Cardiac Risk Factors include: advanced age (>58men, >31 women);dyslipidemia;hypertension;sedentary lifestyle Depression/mood screen:   Depression screen [facility] 2/9 12/02/2018  Decreased Interest 0  Down, Depressed, Hopeless 0  PHQ - 2 Score 0    ADLs:  In your present state of health, do you have any difficulty performing the following activities: 12/02/2018 08/30/2018  Hearing? N N  Vision? N N  Difficulty concentrating or making decisions? N N  Walking or climbing stairs? N N  Dressing or bathing? N N  Doing errands,  shopping? N N  Some recent data might be hidden     Cognitive Testing  Alert? Yes  Normal Appearance?Yes  Oriented to person? Yes  Place? Yes   Time? Yes  Recall of three objects?  Yes  Can perform simple calculations? Yes  Displays appropriate judgment?Yes  Can read the correct time from a watch face?Yes  EOL planning: Does Patient Have a Medical Advance Directive?: No, Yes Type of Advance Directive: [place] will Does patient want to make changes to medical advance directive?: No - Patient declined Copy of [place] in Chart?: No - copy requested   Objective:   Today's Vitals   12/02/18 0939  BP: 120/70  Pulse: (!) 59  Temp: 97.9 F (36.6 C)  SpO2: 97%  Weight: 133 lb 12.8 oz (60.7 kg)  Height: 5\' 5"  (1.651 m)  PainSc: 0-No pain   Body mass index is 22.27 kg/m.  General appearance: alert, no distress, WD/WN,  female HEENT: normocephalic, sclerae anicteric, TMs pearly, nares patent, no discharge or erythema, pharynx normal Oral cavity: MMM, no lesions Neck: supple, no lymphadenopathy, no thyromegaly, no masses Heart: RRR, normal S1, S2, no murmurs Lungs: CTA bilaterally, no wheezes, rhonchi, or rales Abdomen: +bs, soft, non tender, non distended, no masses, no hepatomegaly, no splenomegaly Musculoskeletal: nontender, no swelling, no obvious deformity Extremities: no edema, no cyanosis, no clubbing Pulses: 2+ symmetric, upper and lower extremities, normal cap refill Neurological: alert, oriented x 3, CN2-12 intact, strength normal upper extremities and lower extremities, sensation normal throughout, DTRs 2+ throughout, no cerebellar signs, gait normal Psychiatric: normal affect, behavior normal, pleasant  Breast: defer Gyn: defer Rectal: defer   Medicare Attestation I have personally reviewed: The patient's medical and social history Their use of alcohol, tobacco or illicit drugs Their current medications and  supplements The patient's functional ability including ADLs,fall risks, home safety risks, cognitive, and hearing and visual impairment Diet and physical activities Evidence for depression or mood disorders  The patient's weight, height, BMI, and visual acuity have been recorded in the chart.  I have made referrals, counseling, and provided education to the patient based on review of the above and I have provided the patient with a written personalized care plan for preventive services.     Vicie Mutters, PA-C   12/02/2018

## 2018-12-02 ENCOUNTER — Ambulatory Visit (INDEPENDENT_AMBULATORY_CARE_PROVIDER_SITE_OTHER): Payer: Medicare HMO | Admitting: Physician Assistant

## 2018-12-02 ENCOUNTER — Other Ambulatory Visit: Payer: Self-pay

## 2018-12-02 ENCOUNTER — Encounter: Payer: Self-pay | Admitting: Physician Assistant

## 2018-12-02 VITALS — BP 120/70 | HR 59 | Temp 97.9°F | Ht 65.0 in | Wt 133.8 lb

## 2018-12-02 DIAGNOSIS — R030 Elevated blood-pressure reading, without diagnosis of hypertension: Secondary | ICD-10-CM

## 2018-12-02 DIAGNOSIS — L409 Psoriasis, unspecified: Secondary | ICD-10-CM | POA: Diagnosis not present

## 2018-12-02 DIAGNOSIS — Z79899 Other long term (current) drug therapy: Secondary | ICD-10-CM | POA: Diagnosis not present

## 2018-12-02 DIAGNOSIS — Z862 Personal history of diseases of the blood and blood-forming organs and certain disorders involving the immune mechanism: Secondary | ICD-10-CM

## 2018-12-02 DIAGNOSIS — E782 Mixed hyperlipidemia: Secondary | ICD-10-CM

## 2018-12-02 DIAGNOSIS — R7309 Other abnormal glucose: Secondary | ICD-10-CM | POA: Diagnosis not present

## 2018-12-02 DIAGNOSIS — M8580 Other specified disorders of bone density and structure, unspecified site: Secondary | ICD-10-CM | POA: Diagnosis not present

## 2018-12-02 DIAGNOSIS — Z0001 Encounter for general adult medical examination with abnormal findings: Secondary | ICD-10-CM

## 2018-12-02 DIAGNOSIS — K589 Irritable bowel syndrome without diarrhea: Secondary | ICD-10-CM | POA: Diagnosis not present

## 2018-12-02 DIAGNOSIS — E559 Vitamin D deficiency, unspecified: Secondary | ICD-10-CM | POA: Diagnosis not present

## 2018-12-02 DIAGNOSIS — Z23 Encounter for immunization: Secondary | ICD-10-CM | POA: Diagnosis not present

## 2018-12-02 DIAGNOSIS — Z0000 Encounter for general adult medical examination without abnormal findings: Secondary | ICD-10-CM

## 2018-12-02 DIAGNOSIS — R6889 Other general symptoms and signs: Secondary | ICD-10-CM | POA: Diagnosis not present

## 2018-12-02 NOTE — Patient Instructions (Signed)
Will do tetanus today  Ask insurance and pharmacy about shingrix - new vaccine   Can go to [email or web address] for more information  Shingrix Vaccination  Two vaccines are licensed and recommended to prevent shingles in the [place] Zoster vaccine live (ZVL, Zostavax) has been in use since 2006. Recombinant zoster vaccine (RZV, Shingrix), has been in use since 2017 and is recommended by ACIP as the preferred shingles vaccine.  What Everyone Should Know about Shingles Vaccine (Shingrix) One of the Recommended Vaccines by Disease Shingles vaccination is the only way to protect against shingles and postherpetic neuralgia (PHN), the most common complication from shingles. CDC recommends that healthy adults 50 years and older get two doses of the shingles vaccine called Shingrix (recombinant zoster vaccine), separated by 2 to 6 months, to prevent shingles and the complications from the disease. Your doctor or pharmacist can give you Shingrix as a shot in your upper arm. Shingrix provides strong protection against shingles and PHN. Two doses of Shingrix is more than 90% effective at preventing shingles and PHN. Protection stays above 85% for at least the first four years after you get vaccinated. Shingrix is the preferred vaccine, over Zostavax (zoster vaccine live), a shingles vaccine in use since 2006. Zostavax may still be used to prevent shingles in healthy adults 60 years and older. For example, you could use Zostavax if a person is allergic to Shingrix, prefers Zostavax, or requests immediate vaccination and Shingrix is unavailable. Who Should Get Shingrix? Healthy adults 50 years and older should get two doses of Shingrix, separated by 2 to 6 months. You should get Shingrix even if in the past you . had shingles  . received Zostavax  . are not sure if you had chickenpox There is no maximum age for getting Shingrix. If you had shingles in the past,  you can get Shingrix to help prevent future occurrences of the disease. There is no specific length of time that you need to wait after having shingles before you can receive Shingrix, but generally you should make sure the shingles rash has gone away before getting vaccinated. You can get Shingrix whether or not you remember having had chickenpox in the past. Studies show that more than 99% of Americans 40 years and older have had chickenpox, even if they don't remember having the disease. Chickenpox and shingles are related because they are caused by the same virus (varicella zoster virus). After a person recovers from chickenpox, the virus stays dormant (inactive) in the body. It can reactivate years later and cause shingles. If you had Zostavax in the recent past, you should wait at least eight weeks before getting Shingrix. Talk to your healthcare provider to determine the best time to get Shingrix. Shingrix is available in [employer] and pharmacies. To find doctor's offices or pharmacies near you that offer the vaccine, visit HealthMap Vaccine FinderExternal. If you have questions about Shingrix, talk with your healthcare provider. Vaccine for Those 4 Years and Older  Shingrix reduces the risk of shingles and PHN by more than 90% in people 42 and older. CDC recommends the vaccine for healthy adults 36 and older.  Who Should Not Get Shingrix? You should not get Shingrix if you: . have ever had a severe allergic reaction to any component of the vaccine or after a dose of Shingrix  . tested negative for immunity to varicella zoster virus. If you test negative, you should get chickenpox vaccine.  . currently have shingles  . currently are pregnant  or breastfeeding. Women who are pregnant or breastfeeding should wait to get Shingrix.  Marland Kitchen receive specific antiviral drugs (acyclovir, famciclovir, or valacyclovir) 24 hours before vaccination (avoid use of these antiviral drugs for 14 days after  vaccination)- zoster vaccine live only If you have a minor acute (starts suddenly) illness, such as a cold, you may get Shingrix. But if you have a moderate or severe acute illness, you should usually wait until you recover before getting the vaccine. This includes anyone with a temperature of 101.33F or higher. The side effects of the Shingrix are temporary, and usually last 2 to 3 days. While you may experience pain for a few days after getting Shingrix, the pain will be less severe than having shingles and the complications from the disease. How Well Does Shingrix Work? Two doses of Shingrix provides strong protection against shingles and postherpetic neuralgia (PHN), the most common complication of shingles. . In adults 57 to 79 years old who got two doses, Shingrix was 97% effective in preventing shingles; among adults 70 years and older, Shingrix was 91% effective.  . In adults 73 to 79 years old who got two doses, Shingrix was 91% effective in preventing PHN; among adults 70 years and older, Shingrix was 89% effective. Shingrix protection remained high (more than 85%) in people 70 years and older throughout the four years following vaccination. Since your risk of shingles and PHN increases as you get older, it is important to have strong protection against shingles in your older years. Top of Page  What Are the Possible Side Effects of Shingrix? Studies show that Shingrix is safe. The vaccine helps your body create a strong defense against shingles. As a result, you are likely to have temporary side effects from getting the shots. The side effects may affect your ability to do normal daily activities for 2 to 3 days. Most people got a sore arm with mild or moderate pain after getting Shingrix, and some also had redness and swelling where they got the shot. Some people felt tired, had muscle pain, a headache, shivering, fever, stomach pain, or nausea. About 1 out of 6 people who got Shingrix  experienced side effects that prevented them from doing regular activities. Symptoms went away on their own in about 2 to 3 days. Side effects were more common in younger people. You might have a reaction to the first or second dose of Shingrix, or both doses. If you experience side effects, you may choose to take over-the-counter pain medicine such as ibuprofen or acetaminophen. If you experience side effects from Shingrix, you should report them to the Vaccine Adverse Event Reporting System (VAERS). Your doctor might file this report, or you can do it yourself through the VAERS websiteExternal, or by calling [phone]. If you have any questions about side effects from Shingrix, talk with your doctor. The shingles vaccine does not contain thimerosal (a preservative containing mercury). Top of Page  When Should I See a Doctor Because of the Side Effects I Experience From Shingrix? In clinical trials, Shingrix was not associated with serious adverse events. In fact, serious side effects from vaccines are extremely rare. For example, for every 1 million doses of a vaccine given, only one or two people may have a severe allergic reaction. Signs of an allergic reaction happen within minutes or hours after vaccination and include hives, swelling of the face and throat, difficulty breathing, a fast heartbeat, dizziness, or weakness. If you experience these or any other life-threatening symptoms,  see a doctor right away. Shingrix causes a strong response in your immune system, so it may produce short-term side effects more intense than you are used to from other vaccines. These side effects can be uncomfortable, but they are expected and usually go away on their own in 2 or 3 days. Top of Page  How Can I Pay For Shingrix? There are several ways shingles vaccine may be paid for: Medicare . Medicare Part D plans cover the shingles vaccine, but there may be a cost to you depending on your plan. There may be  a copay for the vaccine, or you may need to pay in full then get reimbursed for a certain amount.  . Medicare Part B does not cover the shingles vaccine. Medicaid . Medicaid may or may not cover the vaccine. Contact your insurer to find out. Private health insurance . Many private health insurance plans will cover the vaccine, but there may be a cost to you depending on your plan. Contact your insurer to find out. Vaccine assistance programs . Some pharmaceutical companies provide vaccines to eligible adults who cannot afford them. You may want to check with the vaccine manufacturer, [employer], about Shingrix. If you do not currently have health insurance, learn more about affordable health coverage optionsExternal. To find doctor's offices or pharmacies near you that offer the vaccine, visit HealthMap Vaccine FinderExternal.

## 2018-12-03 LAB — LIPID PANEL
Cholesterol: 171 mg/dL (ref <200)
HDL: 50 mg/dL (ref >=50)
LDL Cholesterol (Calc): 108 mg/dL (calc) — ABNORMAL HIGH
Non-HDL Cholesterol (Calc): 121 mg/dL (calc) (ref <130)
Total CHOL/HDL Ratio: 3.4 (calc) (ref <5.0)
Triglycerides: 51 mg/dL (ref <150)

## 2018-12-03 LAB — COMPLETE METABOLIC PANEL WITH GFR
AG Ratio: 1.7 (calc) (ref 1.0–2.5)
ALT: 15 U/L (ref 6–29)
AST: 16 U/L (ref 10–35)
Albumin: 4 g/dL (ref 3.6–5.1)
Alkaline phosphatase (APISO): 83 U/L (ref 37–153)
BUN: 23 mg/dL (ref 7–25)
CO2: 29 mmol/L (ref 20–32)
Calcium: 9.3 mg/dL (ref 8.6–10.4)
Chloride: 107 mmol/L (ref 98–110)
Creat: 0.66 mg/dL (ref 0.60–0.93)
GFR, Est African American: 97 mL/min/{1.73_m2} (ref >=60)
GFR, Est Non African American: 84 mL/min/{1.73_m2} (ref >=60)
Globulin: 2.3 g/dL (calc) (ref 1.9–3.7)
Glucose, Bld: 102 mg/dL — ABNORMAL HIGH (ref 65–99)
Potassium: 4.2 mmol/L (ref 3.5–5.3)
Sodium: 141 mmol/L (ref 135–146)
Total Bilirubin: 0.6 mg/dL (ref 0.2–1.2)
Total Protein: 6.3 g/dL (ref 6.1–8.1)

## 2018-12-03 LAB — CBC WITH DIFFERENTIAL/PLATELET
Absolute Monocytes: 413 cells/uL (ref 200–950)
Basophils Absolute: 61 cells/uL (ref 0–200)
Basophils Relative: 1.1 %
Eosinophils Absolute: 121 cells/uL (ref 15–500)
Eosinophils Relative: 2.2 %
HCT: 36.3 % (ref 35.0–45.0)
Hemoglobin: 12.1 g/dL (ref 11.7–15.5)
Lymphs Abs: 1342 cells/uL (ref 850–3900)
MCH: 31.1 pg (ref 27.0–33.0)
MCHC: 33.3 g/dL (ref 32.0–36.0)
MCV: 93.3 fL (ref 80.0–100.0)
MPV: 12.1 fL (ref 7.5–12.5)
Monocytes Relative: 7.5 %
Neutro Abs: 3564 cells/uL (ref 1500–7800)
Neutrophils Relative %: 64.8 %
Platelets: 199 10*3/uL (ref 140–400)
RBC: 3.89 10*6/uL (ref 3.80–5.10)
RDW: 13.4 % (ref 11.0–15.0)
Total Lymphocyte: 24.4 %
WBC: 5.5 10*3/uL (ref 3.8–10.8)

## 2018-12-03 LAB — TSH: TSH: 0.95 mIU/L (ref 0.40–4.50)

## 2018-12-03 LAB — VITAMIN D 25 HYDROXY (VIT D DEFICIENCY, FRACTURES): Vit D, 25-Hydroxy: 68 ng/mL (ref 30–100)

## 2018-12-03 LAB — MAGNESIUM: Magnesium: 2.1 mg/dL (ref 1.5–2.5)

## 2018-12-06 NOTE — Progress Notes (Signed)
DONE! Sorry!

## 2019-01-04 ENCOUNTER — Ambulatory Visit: Payer: Medicare HMO | Admitting: Adult Health Nurse Practitioner

## 2019-01-04 NOTE — Progress Notes (Deleted)
Assessment and Plan:  There are no diagnoses linked to this encounter.    Further disposition pending results of labs. Discussed med's effects and SE's.   Over 30 minutes of exam, counseling, chart review, and critical decision making was performed.   Future Appointments  Date Time Provider [place]  01/04/2019 10:30 AM Garnet Sierras, NP [facility] None  03/21/2019  2:00 PM Unk Pinto, MD [facility] None  12/14/2019  9:00 AM Vicie Mutters, PA-C [facility] None    ------------------------------------------------------------------------------------------------------------------   HPI 78 y.o.female presents for  Past Medical History:  Diagnosis Date  . History of ITP   . Hyperlipidemia   . IBS (irritable bowel syndrome)   . Osteopenia   . Psoriasis      Allergies  Allergen Reactions  . Atorvastatin Other (See Comments)    myalgia  . Fosamax [Alendronate Sodium] Other (See Comments)    Myalgia    Current Outpatient Medications on File Prior to Visit  Medication Sig  . Calcium Carbonate-Vitamin D (CALCIUM + D PO) Take 1 tablet by mouth daily.  . Cholecalciferol (VITAMIN D3) 5000 UNITS TABS Take 5,000-10,000 Units by mouth daily. Take 2 capsules (10,000 units) on Tuesday, Thursday, Saturday, take 1 capsule (5,000 units) on Sunday, Monday, Wednesday, Friday  . ezetimibe (ZETIA) 10 MG tablet TAKE 1 TABLET BY MOUTH ONCE DAILY FOR  HIGH  CHOLESTEROL  . gemfibrozil (LOPID) 600 MG tablet TAKE 1 TABLET BY MOUTH TWICE DAILY WITH A MEAL FOR CHOLESTEROL  . halobetasol (ULTRAVATE) 0.05 % ointment Apply topically 2 (two) times daily.  Marland Kitchen MAGNESIUM PO Take 1 tablet by mouth daily.   . Multiple Vitamin (MULTIVITAMIN WITH MINERALS) TABS tablet Take 1 tablet by mouth daily.   No current facility-administered medications on file prior to visit.     ROS: all negative except above.   Physical Exam:  There were no vitals taken for this visit.  General Appearance: Well  nourished, in no apparent distress. Eyes: PERRLA, EOMs, conjunctiva no swelling or erythema Sinuses: No Frontal/maxillary tenderness ENT/Mouth: Ext aud canals clear, TMs without erythema, bulging. No erythema, swelling, or exudate on post pharynx.  Tonsils not swollen or erythematous. Hearing normal.  Neck: Supple, thyroid normal.  Respiratory: Respiratory effort normal, BS equal bilaterally without rales, rhonchi, wheezing or stridor.  Cardio: RRR with no MRGs. Brisk peripheral pulses without edema.  Abdomen: Soft, + BS.  Non tender, no guarding, rebound, hernias, masses. Lymphatics: Non tender without lymphadenopathy.  Musculoskeletal: Full ROM, 5/5 strength, normal gait.  Skin: Warm, dry without rashes, lesions, ecchymosis.  Neuro: Cranial nerves intact. Normal muscle tone, no cerebellar symptoms. Sensation intact.  Psych: Awake and oriented X 3, normal affect, Insight and Judgment appropriate.     Garnet Sierras, NP 12:55 AM [facility]

## 2019-01-05 ENCOUNTER — Other Ambulatory Visit: Payer: Self-pay | Admitting: Internal Medicine

## 2019-01-05 DIAGNOSIS — Z1389 Encounter for screening for other disorder: Secondary | ICD-10-CM | POA: Diagnosis not present

## 2019-01-05 DIAGNOSIS — R1032 Left lower quadrant pain: Secondary | ICD-10-CM | POA: Diagnosis not present

## 2019-01-05 NOTE — Progress Notes (Addendum)
Addendum: 01/06/19-Spoke with patient via telephone verifying two identifiers.  Discussed results of X-ray, mild narrowing of left hip joint, mild osteophyte formation.  Ill-defined sclerosis left femoral head, possible avascular necrosis. Early stage 2?  Pain is mild an transient at this point.   Will place referral for orthopedics for further imaging/treatment evaluation. Patient to call or return with any new or worsening symptoms.  Garnet Sierras, NP [facility] 01/06/2019  5:14 PM          Assessment and Plan:  Jhovana was seen today for acute visit.  Diagnoses and all orders for this visit:  Pelvic pain DG Hip Unilat W OR W/O Pelvis Min 2 Views Left; Future Rule our compression fracture  Consider soft tissue strain? Transvaginal ultrasound negative for acute pathology related to her pain.  Irritable bowel syndrome with diarrhea Taking imodium PRN, does not take daily No other maintenance medications  Osteoporosis of femur without pathological Fx Taking calcium and Vitamin D as recommended Has taken Fosamax in the past and unable to tolerate this Consider Prolia?  Hemorrhoids Intermittent flares, uses tucks/Witchhazel Use OTC Preparation H for flares Continue to monitor.    Further disposition pending results of labs. Discussed med's effects and SE's.   Over 30 minutes of exam, counseling, chart review, and critical decision making was performed.   Future Appointments  Date Time Provider [place]  03/21/2019  2:00 PM Unk Pinto, MD [facility] None  12/14/2019  9:00 AM Vicie Mutters, PA-C [facility] None    ------------------------------------------------------------------------------------------------------------------   HPI 78 y.o.female presents for evaluation of pelvic pain.  She was seen one day ago by [facility], Dr Ulanda Edison for evaluation of LLQ pain for 2 weeks.  At this time she was reporting  3 loose BM's in the AM.  Today she present with LLQ abdominal pain.  It is intermittent and varying duration.  It is more bothersome when she is sitting and laying.  At one point she reports that it was on both sides of of her pelvic area but now it seems to settle on the LLQ.  When she is up moving around she does not notice the pain.  She rates the pain as variable and the highest at 6/10.   She has history of IBS-D, she reports that she will take an imodium to help with her diarrhea.  She reports that coffee and spicy foods will trigger diarrhea.  She reports that some of her bowel movements are formed and some loose.  She has anywhere from 5-10 times a day.     Past Medical History:  Diagnosis Date  . History of ITP   . Hyperlipidemia   . IBS (irritable bowel syndrome)   . Osteopenia   . Psoriasis      Allergies  Allergen Reactions  . Atorvastatin Other (See Comments)    myalgia  . Fosamax [Alendronate Sodium] Other (See Comments)    Myalgia    Current Outpatient Medications on File Prior to Visit  Medication Sig  . Calcium Carbonate-Vitamin D (CALCIUM + D PO) Take 1 tablet by mouth daily.  . Cholecalciferol (VITAMIN D3) 5000 UNITS TABS Take 5,000-10,000 Units by mouth daily. Take 2 capsules (10,000 units) on Tuesday, Thursday, Saturday, take 1 capsule (5,000 units) on Sunday, Monday, Wednesday, Friday  . ezetimibe (ZETIA) 10 MG tablet TAKE 1 TABLET BY MOUTH ONCE DAILY FOR  HIGH  CHOLESTEROL  . gemfibrozil (LOPID) 600 MG tablet TAKE 1 TABLET BY MOUTH TWICE  DAILY WITH A MEAL FOR CHOLESTEROL  . halobetasol (ULTRAVATE) 0.05 % ointment Apply topically 2 (two) times daily.  Marland Kitchen MAGNESIUM PO Take 1 tablet by mouth daily.   . Multiple Vitamin (MULTIVITAMIN WITH MINERALS) TABS tablet Take 1 tablet by mouth daily.   No current facility-administered medications on file prior to visit.     ROS: all negative except above. Review of Systems  Constitutional: Negative for chills, diaphoresis,  fever, malaise/fatigue and weight loss.  Gastrointestinal: Positive for diarrhea. Negative for abdominal pain, blood in stool, constipation, heartburn, melena, nausea and vomiting.  Genitourinary: Negative for dysuria, flank pain, frequency and urgency.  Musculoskeletal:       Pelvic pain, intermittent.     Physical Exam:  BP 122/68   Pulse 66   Temp 97.7 F (36.5 C)   Wt 134 lb (60.8 kg)   SpO2 97%   BMI 22.30 kg/m   General Appearance: Well nourished, in no apparent distress. Eyes: PERRLA, EOMs, conjunctiva no swelling or erythema Sinuses: No Frontal/maxillary tenderness ENT/Mouth: Ext aud canals clear, TMs without erythema, bulging. No erythema, swelling, or exudate on post pharynx.  Tonsils not swollen or erythematous. Hearing normal.  Neck: Supple, thyroid normal.  Respiratory: Respiratory effort normal, BS equal bilaterally without rales, rhonchi, wheezing or stridor.  Cardio: RRR with no MRGs. Brisk peripheral pulses without edema.  Abdomen: Soft, + BS, hyperactive..  Non tender, no guarding, rebound, hernias, masses. Lymphatics: Non tender without lymphadenopathy.  Musculoskeletal: Full ROM, 5/5 strength, normal gait. Point tenderness to left pubic symphysis, 8/10 that caused radiating pain toward right side. No pain or discomfort with valgus or varus stress.       Skin: Warm, dry without rashes, lesions, ecchymosis.  Neuro: Cranial nerves intact. Normal muscle tone, no cerebellar symptoms. Sensation intact.  Psych: Awake and oriented X 3, normal affect, Insight and Judgment appropriate.     Garnet Sierras, NP 11:53 AM [facility]

## 2019-01-06 ENCOUNTER — Ambulatory Visit (INDEPENDENT_AMBULATORY_CARE_PROVIDER_SITE_OTHER): Payer: Medicare HMO | Admitting: Adult Health Nurse Practitioner

## 2019-01-06 ENCOUNTER — Ambulatory Visit
Admission: RE | Admit: 2019-01-06 | Discharge: 2019-01-06 | Disposition: A | Discharge status: Home / Self Care | Payer: Medicare HMO | Source: Ambulatory Visit | Attending: Adult Health Nurse Practitioner | Admitting: Adult Health Nurse Practitioner

## 2019-01-06 ENCOUNTER — Encounter: Payer: Self-pay | Admitting: Adult Health Nurse Practitioner

## 2019-01-06 ENCOUNTER — Other Ambulatory Visit: Payer: Self-pay | Admitting: Adult Health Nurse Practitioner

## 2019-01-06 ENCOUNTER — Other Ambulatory Visit: Payer: Self-pay

## 2019-01-06 VITALS — BP 122/68 | HR 66 | Temp 97.7°F | Wt 134.0 lb

## 2019-01-06 DIAGNOSIS — K649 Unspecified hemorrhoids: Secondary | ICD-10-CM | POA: Diagnosis not present

## 2019-01-06 DIAGNOSIS — R102 Pelvic and perineal pain: Secondary | ICD-10-CM

## 2019-01-06 DIAGNOSIS — M810 Age-related osteoporosis without current pathological fracture: Secondary | ICD-10-CM | POA: Diagnosis not present

## 2019-01-06 DIAGNOSIS — M1612 Unilateral primary osteoarthritis, left hip: Secondary | ICD-10-CM | POA: Diagnosis not present

## 2019-01-06 DIAGNOSIS — K580 Irritable bowel syndrome with diarrhea: Secondary | ICD-10-CM

## 2019-01-06 MED ORDER — CHOLESTYRAMINE 4 G PO PACK: 1.0000 | PACK | Freq: Four times a day (QID) | ORAL | 11 refills | Status: DC | PRN

## 2019-01-06 NOTE — Patient Instructions (Addendum)
We are sending you for a pelvic xray to rule out pevlic fracture.  We will call you when we get the results.       You may use over the counter Preparation H, internal cream, for your hemorrhoids.  Consider trying Cholestyramine powder packets.  You can use this up to four times a day, take this before meals.  This should help to slow the frequency of diarrhea   Cholestyramine powder for oral suspension What is this medicine? CHOLESTYRAMINE (koe LESS tir a meen) is used to lower cholesterol in patients who are at risk of heart disease or stroke. This medicine is only for patients whose cholesterol level is not controlled by diet. This medicine may be used for other purposes; ask your health care provider or pharmacist if you have questions. COMMON BRAND NAME(S): Locholest, Locholest Light, Prevalite, Questran, Questran Light What should I tell my health care provider before I take this medicine? They need to know if you have any of these conditions:  blocked bile duct  an unusual or allergic reaction to cholestyramine, other medicines, foods, dyes, or preservatives  pregnant or trying to get pregnant  breast-feeding How should I use this medicine? Do not take this medicine in the dry form. It must be mixed with a liquid before swallowing. Follow the directions on the prescription label. Place the powder in a glass or cup. Add between 2 and 6 ounces of fluid. This can be water, milk, pulpy fruit juice, fluid soup, or other liquid. Mix well and drink all of the liquid. Take your doses at regular intervals. Do not take your medicine more often than directed. Talk to your pediatrician regarding the use of this medicine in children. Special care may be needed. Overdosage: If you think you have taken too much of this medicine contact a poison control center or emergency room at once. NOTE: This medicine is only for you. Do not share this medicine with others. What if I miss a dose? If you miss  a dose, take it as soon as you can. If it is almost time for your next dose, take only that dose. Do not take double or extra doses. What may interact with this medicine?  diuretics  female hormones, like estrogens or progestins and birth control pills  heart medicines such as digoxin or digitoxin  penicillin G  phenobarbital  phenylbutazone  phytonadione  propranolol  tetracycline antibiotics  thyroid hormones  vitamin A  vitamin D  vitamin E  warfarin Take other drugs at least 1 hour before or 4 hours after this medicine, to avoid decreasing their absorption. This list may not describe all possible interactions. Give your health care provider a list of all the medicines, herbs, non-prescription drugs, or dietary supplements you use. Also tell them if you smoke, drink alcohol, or use illegal drugs. Some items may interact with your medicine. What should I watch for while using this medicine? Visit your doctor or health care professional for regular checks on your progress. Your blood fats and other tests will be measured from time to time. This medicine is only part of a total cholesterol-lowering program. Your health care professional or dietician can suggest a low-cholesterol and low-fat diet that will reduce your risk of getting heart and blood vessel disease. Avoid alcohol and smoking, and keep a proper exercise schedule. To reduce the chance of getting constipated, drink plenty of water and increase the amount of fiber in your diet. Ask your doctor or health  care professional for advice if you are constipated. This medicine may cause a decrease in folic acid. You should make sure that you get enough folic acid while you are taking this medicine. Discuss the foods you eat and the vitamins you take with your health care professional. What side effects may I notice from receiving this medicine? Side effects that you should report to your doctor or health care professional as  soon as possible:  allergic reactions like skin rash, itching or hives, swelling of the face, lips, or tongue  bloody or black, tarry stools  severe stomach pain with nausea and vomiting  unusual bleeding Side effects that usually do not require medical attention (report to your doctor or health care professional if they continue or are bothersome):  constipation or diarrhea  dizziness  headache  heartburn, indigestion  nausea, vomiting  perianal irritation This list may not describe all possible side effects. Call your doctor for medical advice about side effects. You may report side effects to FDA at 1-800-FDA-1088. Where should I keep my medicine? Keep out of the reach of children. Store at room temperature between 15 and 30 degrees C (59 and 86 degrees F). Throw away any unused medicine after the expiration date. NOTE: This sheet is a summary. It may not cover all possible information. If you have questions about this medicine, talk to your doctor, pharmacist, or health care provider.  2020 Elsevier/Gold Standard (2016-11-25 11:50:51)       [email or web address]

## 2019-01-11 DIAGNOSIS — R69 Illness, unspecified: Secondary | ICD-10-CM | POA: Diagnosis not present

## 2019-01-11 NOTE — Addendum Note (Signed)
Addended byGarnet Sierras A on: 01/11/2019 09:23 AM   Modules accepted: Orders

## 2019-01-25 DIAGNOSIS — M1612 Unilateral primary osteoarthritis, left hip: Secondary | ICD-10-CM | POA: Diagnosis not present

## 2019-01-25 DIAGNOSIS — M25552 Pain in left hip: Secondary | ICD-10-CM | POA: Diagnosis not present

## 2019-03-20 ENCOUNTER — Encounter: Payer: Self-pay | Admitting: Internal Medicine

## 2019-03-20 NOTE — Progress Notes (Signed)
Annual Screening/Preventative Visit & Comprehensive Evaluation &  Examination     This very nice 78 y.o. WWF presents for a Screening /Preventative Visit & comprehensive evaluation and management of multiple medical co-morbidities.  Patient has been followed for labile HTN, HLD, Prediabetes  and Vitamin D Deficiency.     Last month, she was seen by Dr Mayer Camel for Left hip pains attributed to xray findings of Aseptic Necrosis, but as she was improving, treatment was deferred.      Patient is monitored expectantly for labile HTN . Patient's BP has been controlled at home and patient denies any cardiac symptoms as chest pain, palpitations, shortness of breath, dizziness or ankle swelling. Today's BP is at goal - 126/72.   BP Readings from Last 3 Encounters:  03/21/19 126/72  01/06/19 122/68  12/02/18 120/70       Patient is intolerant of Statins and is on Lopid / Zetia  / Questran and her hyperlipidemia is not  controlled with diet and medications. Patient denies myalgias or other medication SE's. Last lipids were not at goal:  Lab Results  Component Value Date   CHOL 171 12/02/2018   HDL 50 12/02/2018   LDLCALC 108 (H) 12/02/2018   TRIG 51 12/02/2018   CHOLHDL 3.4 12/02/2018       Patient has hx/o prediabetes (A1c 5.8%/2015) and patient denies reactive hypoglycemic symptoms, visual blurring, diabetic polys or paresthesias. Last A1c was Normal & at goal:  Lab Results  Component Value Date   HGBA1C 5.6 08/31/2018       Finally, patient has history of Vitamin D Deficiency ("34"/2010) and last Vitamin D was at goal:  Lab Results  Component Value Date   VD25OH 68 12/02/2018    Current Outpatient Medications on File Prior to Visit  Medication Sig  . Calcium Carbonate-Vitamin D (CALCIUM + D PO) Take 1 tablet by mouth daily.  . Cholecalciferol (VITAMIN D3) 5000 UNITS TABS Take 5,000-10,000 Units by mouth daily. Take 2 capsules (10,000 units) on Tuesday, Thursday, Saturday, take  1 capsule (5,000 units) on Sunday, Monday, Wednesday, Friday  . cholestyramine (QUESTRAN) 4 g packet Take 1 packet by mouth 4 (four) times daily as needed.  . ezetimibe (ZETIA) 10 MG tablet TAKE 1 TABLET BY MOUTH ONCE DAILY FOR  HIGH  CHOLESTEROL  . gemfibrozil (LOPID) 600 MG tablet TAKE 1 TABLET BY MOUTH TWICE DAILY WITH A MEAL FOR CHOLESTEROL  . halobetasol (ULTRAVATE) 0.05 % ointment Apply topically 2 (two) times daily.  Marland Kitchen MAGNESIUM PO Take 1 tablet by mouth daily.   . Multiple Vitamin (MULTIVITAMIN WITH MINERALS) TABS tablet Take 1 tablet by mouth daily.   No current facility-administered medications on file prior to visit.    Allergies  Allergen Reactions  . Atorvastatin Other (See Comments)    myalgia  . Fosamax [Alendronate Sodium] Other (See Comments)    Myalgia   Past Medical History:  Diagnosis Date  . History of ITP   . Hyperlipidemia   . IBS (irritable bowel syndrome)   . Osteopenia   . Psoriasis    Health Maintenance  Topic Date Due  . TETANUS/TDAP  12/01/2028  . INFLUENZA VACCINE  Completed  . DEXA SCAN  Completed  . PNA vac Low Risk Adult  Completed   Immunization History  Administered Date(s) Administered  . Influenza Split 01/11/2019  . Influenza, High Dose Seasonal PF 01/25/2014, 01/22/2017  . Influenza,inj,quad, With Preservative 01/19/2017  . Influenza-Unspecified 12/21/2014, 01/22/2016, 01/18/2018  . PPD Test 03/14/2013  .  Pneumococcal Conjugate-13 12/05/2015  . Pneumococcal Polysaccharide-23 02/08/2009, 06/30/2014  . Td 02/08/2009  . Tdap 12/02/2018  . Zoster 03/29/2009   Last Colon - Patient refuses   Cologard - 07/31/ Negative - Recc 3 yr f/u due Aug 2022  Last MGM - 11/22/2018  Past Surgical History:  Procedure Laterality Date  . ABDOMINAL HYSTERECTOMY    . APPENDECTOMY    . BREAST SURGERY Right    Biospy, benign  . EYE SURGERY Bilateral    Cataracts  . EYE SURGERY     Lense implants  . TUBAL LIGATION     Family History  Problem  Relation Age of Onset  . AAA (abdominal aortic aneurysm) Mother   . Hypertension Mother   . Hypertension Father   . Heart disease Father   . Hyperlipidemia Father   . Cancer Maternal Aunt        uterine   Social History   Tobacco Use  . Smoking status: Never Smoker  . Smokeless tobacco: Never Used  Substance Use Topics  . Alcohol use: Yes    Comment: occasionl  . Drug use: No    ROS Constitutional: Denies fever, chills, weight loss/gain, headaches, insomnia,  night sweats, and change in appetite. Does c/o fatigue. Eyes: Denies redness, blurred vision, diplopia, discharge, itchy, watery eyes.  ENT: Denies discharge, congestion, post nasal drip, epistaxis, sore throat, earache, hearing loss, dental pain, Tinnitus, Vertigo, Sinus pain, snoring.  Cardio: Denies chest pain, palpitations, irregular heartbeat, syncope, dyspnea, diaphoresis, orthopnea, PND, claudication, edema Respiratory: denies cough, dyspnea, DOE, pleurisy, hoarseness, laryngitis, wheezing.  Gastrointestinal: Denies dysphagia, heartburn, reflux, water brash, pain, cramps, nausea, vomiting, bloating, diarrhea, constipation, hematemesis, melena, hematochezia, jaundice, hemorrhoids Genitourinary: Denies dysuria, frequency, urgency, nocturia, hesitancy, discharge, hematuria, flank pain Breast: Breast lumps, nipple discharge, bleeding.  Musculoskeletal: Denies arthralgia, myalgia, stiffness, Jt. Swelling, pain, limp, and strain/sprain. Denies falls. Skin: Denies puritis, rash, hives, warts, acne, eczema, changing in skin lesion Neuro: No weakness, tremor, incoordination, spasms, paresthesia, pain Psychiatric: Denies confusion, memory loss, sensory loss. Denies Depression. Endocrine: Denies change in weight, skin, hair change, nocturia, and paresthesia, diabetic polys, visual blurring, hyper / hypo glycemic episodes.  Heme/Lymph: No excessive bleeding, bruising, enlarged lymph nodes.  Physical Exam  BP 126/72   Pulse 68    Temp (!) 96.3 F (35.7 C)   Ht 5' 4.75" (1.645 m)   Wt 136 lb 9.6 oz (62 kg)   SpO2 93%   BMI 22.91 kg/m   General Appearance: Well nourished, well groomed and in no apparent distress.  Eyes: PERRLA, EOMs, conjunctiva no swelling or erythema, normal fundi and vessels. Sinuses: No frontal/maxillary tenderness ENT/Mouth: EACs patent / TMs  nl. Nares clear without erythema, swelling, mucoid exudates. Oral hygiene is good. No erythema, swelling, or exudate. Tongue normal, non-obstructing. Tonsils not swollen or erythematous. Hearing normal.  Neck: Supple, thyroid not palpable. No bruits, nodes or JVD. Respiratory: Respiratory effort normal.  BS equal and clear bilateral without rales, rhonci, wheezing or stridor. Cardio: Heart sounds are normal with regular rate and rhythm and no murmurs, rubs or gallops. Peripheral pulses are normal and equal bilaterally without edema. No aortic or femoral bruits. Chest: symmetric with normal excursions and percussion. Breasts: Symmetric, without lumps, nipple discharge, retractions, or fibrocystic changes.  Abdomen: Flat, soft with bowel sounds active. Nontender, no guarding, rebound, hernias, masses, or organomegaly.  Lymphatics: Non tender without lymphadenopathy.  Genitourinary:  Musculoskeletal: Full ROM all peripheral extremities, joint stability, 5/5 strength, and normal gait. Skin: Warm  and dry without rashes, lesions, cyanosis, clubbing or  ecchymosis.  Neuro: Cranial nerves intact, reflexes equal bilaterally. Normal muscle tone, no cerebellar symptoms. Sensation intact.  Pysch: Alert and oriented X 3, normal affect, Insight and Judgment appropriate.   Assessment and Plan  1. Annual Preventative Screening Examination  2. Elevated BP w/o Dx HTN  - EKG 12-Lead - Urinalysis, Routine w reflex microscopic - Microalbumin / Creatinine Urine Ratio - CBC with Diff - COMPLETE METABOLIC PANEL WITH GFR - Magnesium - TSH  3. Hyperlipidemia, mixed   - EKG 12-Lead - Lipid Profile - TSH  4. Abnormal glucose  - EKG 12-Lead - Hemoglobin A1c ([facility]) - Insulin, random  5. Vitamin D deficiency  - Vitamin D (25 hydroxy)  6. Prediabetes  - Hemoglobin A1c ([facility]) - Insulin, random  7. Aseptic necrosis of bone of hip, left ([place])   8. Screening for colorectal cancer  - POC Hemoccult Bld/Stl   9. Screening for ischemic heart disease  - EKG 12-Lead  10. FHx: heart disease  - EKG 12-Lead  11. Medication management  - Urinalysis, Routine w reflex microscopic - Microalbumin / Creatinine Urine Ratio - CBC with Diff - COMPLETE METABOLIC PANEL WITH GFR - Magnesium - Lipid Profile - TSH - Hemoglobin A1c ([facility]) - Insulin, random - Vitamin D (25 hydroxy)        Patient was counseled in prudent diet to achieve/maintain BMI less than 25 for weight control, BP monitoring, regular exercise and medications. Discussed med's effects and SE's. Screening labs and tests as requested with regular follow-up as recommended. Over 40 minutes of exam, counseling, chart review and high complex critical decision making was performed.   Kirtland Bouchard, MD

## 2019-03-20 NOTE — Patient Instructions (Signed)

## 2019-03-21 ENCOUNTER — Other Ambulatory Visit: Payer: Self-pay

## 2019-03-21 ENCOUNTER — Ambulatory Visit (INDEPENDENT_AMBULATORY_CARE_PROVIDER_SITE_OTHER): Payer: Medicare HMO | Admitting: Internal Medicine

## 2019-03-21 ENCOUNTER — Encounter: Payer: Self-pay | Admitting: Internal Medicine

## 2019-03-21 VITALS — BP 126/72 | HR 68 | Temp 96.3°F | Ht 64.75 in | Wt 136.6 lb

## 2019-03-21 DIAGNOSIS — Z0001 Encounter for general adult medical examination with abnormal findings: Secondary | ICD-10-CM

## 2019-03-21 DIAGNOSIS — R7309 Other abnormal glucose: Secondary | ICD-10-CM

## 2019-03-21 DIAGNOSIS — Z136 Encounter for screening for cardiovascular disorders: Secondary | ICD-10-CM

## 2019-03-21 DIAGNOSIS — Z79899 Other long term (current) drug therapy: Secondary | ICD-10-CM | POA: Diagnosis not present

## 2019-03-21 DIAGNOSIS — E559 Vitamin D deficiency, unspecified: Secondary | ICD-10-CM | POA: Diagnosis not present

## 2019-03-21 DIAGNOSIS — R7303 Prediabetes: Secondary | ICD-10-CM | POA: Diagnosis not present

## 2019-03-21 DIAGNOSIS — M87052 Idiopathic aseptic necrosis of left femur: Secondary | ICD-10-CM

## 2019-03-21 DIAGNOSIS — E782 Mixed hyperlipidemia: Secondary | ICD-10-CM

## 2019-03-21 DIAGNOSIS — Z8249 Family history of ischemic heart disease and other diseases of the circulatory system: Secondary | ICD-10-CM | POA: Diagnosis not present

## 2019-03-21 DIAGNOSIS — Z1211 Encounter for screening for malignant neoplasm of colon: Secondary | ICD-10-CM

## 2019-03-21 DIAGNOSIS — Z0000 Encounter for general adult medical examination without abnormal findings: Secondary | ICD-10-CM

## 2019-03-21 DIAGNOSIS — R030 Elevated blood-pressure reading, without diagnosis of hypertension: Secondary | ICD-10-CM | POA: Diagnosis not present

## 2019-03-22 LAB — HEMOGLOBIN A1C
Hgb A1c MFr Bld: 5.5 % of total Hgb (ref <5.7)
Mean Plasma Glucose: 111 (calc)
eAG (mmol/L): 6.2 (calc)

## 2019-03-22 LAB — CBC WITH DIFFERENTIAL/PLATELET
Absolute Monocytes: 464 cells/uL (ref 200–950)
Basophils Absolute: 67 cells/uL (ref 0–200)
Basophils Relative: 1.1 %
Eosinophils Absolute: 159 cells/uL (ref 15–500)
Eosinophils Relative: 2.6 %
HCT: 37.2 % (ref 35.0–45.0)
Hemoglobin: 12.3 g/dL (ref 11.7–15.5)
Lymphs Abs: 1745 cells/uL (ref 850–3900)
MCH: 30.4 pg (ref 27.0–33.0)
MCHC: 33.1 g/dL (ref 32.0–36.0)
MCV: 92.1 fL (ref 80.0–100.0)
MPV: 11.3 fL (ref 7.5–12.5)
Monocytes Relative: 7.6 %
Neutro Abs: 3666 cells/uL (ref 1500–7800)
Neutrophils Relative %: 60.1 %
Platelets: 218 10*3/uL (ref 140–400)
RBC: 4.04 10*6/uL (ref 3.80–5.10)
RDW: 12.3 % (ref 11.0–15.0)
Total Lymphocyte: 28.6 %
WBC: 6.1 10*3/uL (ref 3.8–10.8)

## 2019-03-22 LAB — LIPID PANEL
Cholesterol: 177 mg/dL (ref <200)
HDL: 60 mg/dL (ref >=50)
LDL Cholesterol (Calc): 105 mg/dL (calc) — ABNORMAL HIGH
Non-HDL Cholesterol (Calc): 117 mg/dL (calc) (ref <130)
Total CHOL/HDL Ratio: 3 (calc) (ref <5.0)
Triglycerides: 41 mg/dL (ref <150)

## 2019-03-22 LAB — COMPLETE METABOLIC PANEL WITH GFR
AG Ratio: 1.6 (calc) (ref 1.0–2.5)
ALT: 20 U/L (ref 6–29)
AST: 25 U/L (ref 10–35)
Albumin: 4.5 g/dL (ref 3.6–5.1)
Alkaline phosphatase (APISO): 84 U/L (ref 37–153)
BUN/Creatinine Ratio: 35 (calc) — ABNORMAL HIGH (ref 6–22)
BUN: 20 mg/dL (ref 7–25)
CO2: 27 mmol/L (ref 20–32)
Calcium: 9.8 mg/dL (ref 8.6–10.4)
Chloride: 101 mmol/L (ref 98–110)
Creat: 0.57 mg/dL — ABNORMAL LOW (ref 0.60–0.93)
GFR, Est African American: 102 mL/min/{1.73_m2} (ref >=60)
GFR, Est Non African American: 88 mL/min/{1.73_m2} (ref >=60)
Globulin: 2.8 g/dL (calc) (ref 1.9–3.7)
Glucose, Bld: 85 mg/dL (ref 65–99)
Potassium: 4.1 mmol/L (ref 3.5–5.3)
Sodium: 139 mmol/L (ref 135–146)
Total Bilirubin: 0.6 mg/dL (ref 0.2–1.2)
Total Protein: 7.3 g/dL (ref 6.1–8.1)

## 2019-03-22 LAB — URINALYSIS, ROUTINE W REFLEX MICROSCOPIC
Bacteria, UA: NONE SEEN /HPF
Bilirubin Urine: NEGATIVE
Glucose, UA: NEGATIVE
Hgb urine dipstick: NEGATIVE
Hyaline Cast: NONE SEEN /LPF
Ketones, ur: NEGATIVE
Nitrite: NEGATIVE
Protein, ur: NEGATIVE
RBC / HPF: NONE SEEN /HPF (ref 0–2)
Specific Gravity, Urine: 1.016 (ref 1.001–1.03)
Squamous Epithelial / HPF: NONE SEEN /HPF (ref <=5)
pH: <=5 (ref 5.0–8.0)

## 2019-03-22 LAB — INSULIN, RANDOM: Insulin: 1.5 u[IU]/mL

## 2019-03-22 LAB — MAGNESIUM: Magnesium: 2 mg/dL (ref 1.5–2.5)

## 2019-03-22 LAB — MICROALBUMIN / CREATININE URINE RATIO
Creatinine, Urine: 68 mg/dL (ref 20–275)
Microalb Creat Ratio: 10 mcg/mg creat (ref <30)
Microalb, Ur: 0.7 mg/dL

## 2019-03-22 LAB — TSH: TSH: 0.99 mIU/L (ref 0.40–4.50)

## 2019-03-22 LAB — VITAMIN D 25 HYDROXY (VIT D DEFICIENCY, FRACTURES): Vit D, 25-Hydroxy: 73 ng/mL (ref 30–100)

## 2019-06-07 ENCOUNTER — Other Ambulatory Visit: Payer: Self-pay | Admitting: Internal Medicine

## 2019-06-07 DIAGNOSIS — E782 Mixed hyperlipidemia: Secondary | ICD-10-CM

## 2019-06-23 ENCOUNTER — Ambulatory Visit: Payer: Medicare HMO | Admitting: Adult Health

## 2019-08-24 DIAGNOSIS — R69 Illness, unspecified: Secondary | ICD-10-CM | POA: Diagnosis not present

## 2019-09-01 DIAGNOSIS — Z961 Presence of intraocular lens: Secondary | ICD-10-CM | POA: Diagnosis not present

## 2019-09-01 DIAGNOSIS — H35371 Puckering of macula, right eye: Secondary | ICD-10-CM | POA: Diagnosis not present

## 2019-09-01 DIAGNOSIS — H43813 Vitreous degeneration, bilateral: Secondary | ICD-10-CM | POA: Diagnosis not present

## 2019-09-01 DIAGNOSIS — H52202 Unspecified astigmatism, left eye: Secondary | ICD-10-CM | POA: Diagnosis not present

## 2019-09-30 NOTE — Progress Notes (Signed)
Patient ID: Madison Gonzalez, female   DOB: [birth year], 79 y.o.   MRN: [id number]  MEDICARE ANNUAL WELLNESS VISIT AND FOLLOW UP  Assessment:    Encounter for Medicare annual wellness exam 1 year  Irritable bowel syndrome, unspecified type Controlled at this time without flare, takes imodium PRN   Psoriasis -  better at this time  Vitamin D deficiency At goal at recent check; continue to recommend supplementation for goal of 70-100  Prediabetes Recent A1Cs at goal Discussed diet/exercise, weight management  Defer A1C; check CMP  Medication management  Hyperlipidemia Continue medications Continue low cholesterol diet and exercise.  Check lipid panel.   History of ITP Monitor  Elevated BP w/o Dx HTN Monitor blood pressure at home; call if consistently over 130/80 Continue DASH diet.   Reminder to go to the ER if any CP, SOB, nausea, dizziness, severe HA, changes vision/speech, left arm numbness and tingling and jaw pain.  Body mass index (BMI) of 23.0-23.9 in adult Continue to recommend diet heavy in fruits and veggies and low in animal meats, cheeses, and dairy products, appropriate calorie intake Discuss exercise recommendations routinely Continue to monitor weight at each visit   Osteopenia, unspecified location Weight bearing exercises, high calcium diet, supplement vitamin D Repeat DEXA with MGM, reviewed lifestyle in depth, importance of preventing falls and lifestyle changes to improve or maintain bone density   Avascular necrosis of hip, left ([place]) Has seen ortho; monitoring only; hip replacement if progressive/limiting pain   Over 30 minutes of exam, counseling, chart review, and critical decision making was performed  Future Appointments  Date Time Provider [place]  01/12/2020  2:00 PM Vicie Mutters, PA-C [facility] None  04/18/2020  2:00 PM Unk Pinto, MD [facility] None     Plan:   During the course of the visit the patient  was educated and counseled about appropriate screening and preventive services including:    Pneumococcal vaccine   Influenza vaccine  Td vaccine  Prevnar 13  Screening electrocardiogram  Screening mammography  Bone densitometry screening  Colorectal cancer screening  Diabetes screening  Glaucoma screening  Nutrition counseling   Advanced directives: given info/requested copies   Subjective:   Madison Gonzalez is a 79 y.o. female who presents for Medicare Annual Wellness Visit and 3 month follow up on hypertension, glucose management, hyperlipidemia, vitamin D def.   [place]ives [place] acres with [place], [place], just resealed her driveway, stays very active on her land.   She has L hip avascular necrosis per xray 12/2018 obtained for pelvic pain, saw Dr. Mayer Camel who recommended monitoring, if progressive/severe pain would have hip replacement.   BMI is Body mass index is 22.77 kg/m., she has been working on diet and exercise, very active around her yard, lives on [place] acres which she cares for by herself. Does ride stationary bike occasionally.  Wt Readings from Last 3 Encounters:  10/03/19 135 lb 12.8 oz (61.6 kg)  03/21/19 136 lb 9.6 oz (62 kg)  01/06/19 134 lb (60.8 kg)   Her blood pressure has been controlled at home, today their BP is BP: 102/66 She does not workout. She denies chest pain, shortness of breath, dizziness.   She is on cholesterol medication (zetia, lopid) and denies myalgias. Her cholesterol is not at goal. The cholesterol last visit was:   Lab Results  Component Value Date   CHOL 177 03/21/2019   HDL 60 03/21/2019   LDLCALC 105 (H) 03/21/2019  TRIG 41 03/21/2019   CHOLHDL 3.0 03/21/2019   She has been working on diet and exercise for glucose management, and denies foot ulcerations, hyperglycemia, hypoglycemia , increased appetite, nausea, paresthesia of the feet, polydipsia, polyuria, visual disturbances, vomiting and weight loss.  Last A1C in the office was:  Lab Results  Component Value Date   HGBA1C 5.5 03/21/2019   Last GFR Lab Results  Component Value Date   GFRNONAA 88 03/21/2019   Patient is on Vitamin D supplement. Lab Results  Component Value Date   VD25OH 73 03/21/2019      Medication Review Current Outpatient Medications on File Prior to Visit  Medication Sig Dispense Refill  . Calcium Carbonate-Vitamin D (CALCIUM + D PO) Take 1 tablet by mouth daily.    . Cholecalciferol (VITAMIN D3) 5000 UNITS TABS Take 5,000-10,000 Units by mouth daily. Take 2 capsules (10,000 units) on Tuesday, Thursday, Saturday, take 1 capsule (5,000 units) on Sunday, Monday, Wednesday, Friday    . cholestyramine (QUESTRAN) 4 g packet Take 1 packet by mouth 4 (four) times daily as needed. 60 each 11  . ezetimibe (ZETIA) 10 MG tablet Take 1 tablet Daily for Cholesterol 90 tablet 3  . gemfibrozil (LOPID) 600 MG tablet Take 1 tablet 2 x /day with a Meal for Cholesterol 180 tablet 3  . halobetasol (ULTRAVATE) 0.05 % ointment Apply topically 2 (two) times daily. 50 g 1  . MAGNESIUM PO Take 1 tablet by mouth daily.     . Multiple Vitamin (MULTIVITAMIN WITH MINERALS) TABS tablet Take 1 tablet by mouth daily.     No current facility-administered medications on file prior to visit.    Current Problems (verified) Patient Active Problem List   Diagnosis Date Noted  . Aseptic necrosis of bone of hip, left ([place]) 10/03/2019  . FHx: heart disease 02/23/2018  . Body mass index (BMI) of 23.0-23.9 in adult 03/31/2015  . Abnormal glucose 03/29/2015  . Elevated BP w/o Dx HTN 06/29/2013  . Hyperlipidemia, mixed 06/29/2013  . Vitamin D deficiency 06/29/2013  . IBS (irritable bowel syndrome)   . Psoriasis   . Osteoporosis   . History of ITP     Screening Tests Immunization History  Administered Date(s) Administered  . Influenza Split 01/11/2019  . Influenza, High Dose Seasonal PF 01/25/2014, 01/22/2017  . Influenza,inj,quad, With  Preservative 01/19/2017  . Influenza-Unspecified 12/21/2014, 01/22/2016, 01/18/2018  . PFIZER SARS-COV-2 Vaccination 05/11/2019, 06/01/2019  . PPD Test 03/14/2013  . Pneumococcal Conjugate-13 12/05/2015  . Pneumococcal Polysaccharide-23 02/08/2009, 06/30/2014  . Td 02/08/2009  . Tdap 12/02/2018  . Zoster 03/29/2009    Preventative care: Last colonoscopy: refuses  Cologuard: 10/2017  Last mammogram:  11/2018 DEXA: 12/2014 osteoporosis, doesn't tolerate fosamax, follow up DEXA ordered  CXR 2016  Prior vaccinations: TD or Tdap: 2020 Influenza:  12/2018  Pneumococcal: 2016 Prevnar13: 2017 Shingles/Zostavax: 2016 Covid 19: 2/2, 2021, pfizer  Names of Other Physician/Practitioners you currently use: 1. [facility]- here for primary care 2. Dr. Kathrin Penner, eye doctor, last visit May 2021 3. [facility], Dr. Louanne Belton, dentist, last visit 2021, q20m  Patient Care Team: Unk Pinto, MD as PCP - General (Internal Medicine) Shon Hough, MD as Consulting Physician (Ophthalmology) Allyn Kenner, MD (Dermatology)  Past Surgical History:  Procedure Laterality Date  . ABDOMINAL HYSTERECTOMY    . APPENDECTOMY    . BREAST SURGERY Right    Biospy, benign  . EYE SURGERY Bilateral    Cataracts  . EYE SURGERY  Lense implants  . TUBAL LIGATION     Family History  Problem Relation Age of Onset  . AAA (abdominal aortic aneurysm) Mother   . Hypertension Mother   . Hypertension Father   . Heart disease Father   . Hyperlipidemia Father   . Cancer Maternal Aunt        uterine   Social History   Tobacco Use  . Smoking status: Never Smoker  . Smokeless tobacco: Never Used  Substance Use Topics  . Alcohol use: Yes    Comment: occasionl  . Drug use: No    MEDICARE WELLNESS OBJECTIVES: Tobacco use: She does not smoke.  Patient is not a former smoker. Alcohol Current alcohol use: social drinker Visual acuity: normal,  does  perform annual eye exam Diet: well balanced Physical activity: Current Exercise Habits: The patient has a physically strenuous job, but has no regular exercise apart from work., Exercise limited by: None identified Cardiac risk factors: Cardiac Risk Factors include: advanced age (>51men, >31 women);dyslipidemia Depression/mood screen:   Depression screen [facility] 2/9 10/03/2019  Decreased Interest 0  Down, Depressed, Hopeless 0  PHQ - 2 Score 0    ADLs:  In your present state of health, do you have any difficulty performing the following activities: 10/03/2019 03/20/2019  Hearing? N N  Vision? N N  Difficulty concentrating or making decisions? N N  Walking or climbing stairs? N N  Dressing or bathing? N N  Doing errands, shopping? N N  Some recent data might be hidden     Cognitive Testing  Alert? Yes  Normal Appearance?Yes  Oriented to person? Yes  Place? Yes   Time? Yes  Recall of three objects?  Yes  Can perform simple calculations? Yes  Displays appropriate judgment?Yes  Can read the correct time from a watch face?Yes  EOL planning: Does Patient Have a Medical Advance Directive?: Yes Type of Advance Directive: Healthcare Power of Attorney, Living will Does patient want to make changes to medical advance directive?: No - Patient declined Copy of [place] in Chart?: No - copy requested   Objective:   Today's Vitals   10/03/19 1054  BP: 102/66  Pulse: 65  Temp: (!) 96.9 F (36.1 C)  SpO2: 97%  Weight: 135 lb 12.8 oz (61.6 kg)   Body mass index is 22.77 kg/m.  General appearance: alert, no distress, WD/WN,  female HEENT: normocephalic, sclerae anicteric, TMs pearly, nares patent, no discharge or erythema, pharynx normal Oral cavity: MMM, no lesions Neck: supple, no lymphadenopathy, no thyromegaly, no masses Heart: RRR, normal S1, S2, no murmurs Lungs: CTA bilaterally, no wheezes, rhonchi, or rales Abdomen: +bs, soft, non tender, non distended, no  masses, no hepatomegaly, no splenomegaly Musculoskeletal: nontender, no swelling, no obvious deformity Extremities: no edema, no cyanosis, no clubbing Pulses: 2+ symmetric, upper and lower extremities, normal cap refill Neurological: alert, oriented x 3, CN2-12 intact, strength normal upper extremities and lower extremities, sensation normal throughout, DTRs 2+ throughout, no cerebellar signs, gait normal Psychiatric: normal affect, behavior normal, pleasant  Breast: defer Gyn: defer Rectal: defer   Medicare Attestation I have personally reviewed: The patient's medical and social history Their use of alcohol, tobacco or illicit drugs Their current medications and supplements The patient's functional ability including ADLs,fall risks, home safety risks, cognitive, and hearing and visual impairment Diet and physical activities Evidence for depression or mood disorders  The patient's weight, height, BMI, and visual acuity have been recorded in the chart.  I  have made referrals, counseling, and provided education to the patient based on review of the above and I have provided the patient with a written personalized care plan for preventive services.     Madison Ribas, NP   10/03/2019

## 2019-10-03 ENCOUNTER — Other Ambulatory Visit: Payer: Self-pay

## 2019-10-03 ENCOUNTER — Encounter: Payer: Self-pay | Admitting: Adult Health

## 2019-10-03 ENCOUNTER — Ambulatory Visit (INDEPENDENT_AMBULATORY_CARE_PROVIDER_SITE_OTHER): Payer: Medicare HMO | Admitting: Adult Health

## 2019-10-03 VITALS — BP 102/66 | HR 65 | Temp 96.9°F | Wt 135.8 lb

## 2019-10-03 DIAGNOSIS — Z0000 Encounter for general adult medical examination without abnormal findings: Secondary | ICD-10-CM

## 2019-10-03 DIAGNOSIS — E782 Mixed hyperlipidemia: Secondary | ICD-10-CM | POA: Diagnosis not present

## 2019-10-03 DIAGNOSIS — L409 Psoriasis, unspecified: Secondary | ICD-10-CM

## 2019-10-03 DIAGNOSIS — E559 Vitamin D deficiency, unspecified: Secondary | ICD-10-CM | POA: Diagnosis not present

## 2019-10-03 DIAGNOSIS — M87052 Idiopathic aseptic necrosis of left femur: Secondary | ICD-10-CM | POA: Diagnosis not present

## 2019-10-03 DIAGNOSIS — M810 Age-related osteoporosis without current pathological fracture: Secondary | ICD-10-CM | POA: Diagnosis not present

## 2019-10-03 DIAGNOSIS — Z862 Personal history of diseases of the blood and blood-forming organs and certain disorders involving the immune mechanism: Secondary | ICD-10-CM

## 2019-10-03 DIAGNOSIS — Z6823 Body mass index (BMI) 23.0-23.9, adult: Secondary | ICD-10-CM | POA: Diagnosis not present

## 2019-10-03 DIAGNOSIS — Z0001 Encounter for general adult medical examination with abnormal findings: Secondary | ICD-10-CM | POA: Diagnosis not present

## 2019-10-03 DIAGNOSIS — K589 Irritable bowel syndrome without diarrhea: Secondary | ICD-10-CM

## 2019-10-03 DIAGNOSIS — R6889 Other general symptoms and signs: Secondary | ICD-10-CM

## 2019-10-03 DIAGNOSIS — R7309 Other abnormal glucose: Secondary | ICD-10-CM | POA: Diagnosis not present

## 2019-10-03 DIAGNOSIS — R030 Elevated blood-pressure reading, without diagnosis of hypertension: Secondary | ICD-10-CM

## 2019-10-03 NOTE — Patient Instructions (Addendum)
[name] , Thank you for taking time to come for your Medicare Wellness Visit. I appreciate your ongoing commitment to your health goals. Please review the following plan we discussed and let me know if I can assist you in the future.   These are the goals we discussed: Goals     Exercise 150 min/wk Moderate Activity     15-20 min daily of exercise to elevate heart rate Weight bearing exercises for lower body regularly for bone strength and balance       This is a list of the screening recommended for you and due dates:  Health Maintenance  Topic Date Due   Flu Shot  11/20/2019   Tetanus Vaccine  12/01/2028   DEXA scan (bone density measurement)  Completed   COVID-19 Vaccine  Completed   Pneumonia vaccines  Completed    Consider adding vitamin K with vitamin D/calcium for bone density  Daily weighted lower body exercises for hip bone strength     Osteoporosis  Osteoporosis is thinning and loss of density in your bones. Osteoporosis makes bones more brittle and fragile and more likely to break (fracture). Over time, osteoporosis can cause your bones to become so weak that they fracture after a minor fall. Bones in the hip, wrist, and spine are most likely to fracture due to osteoporosis. What are the causes? The exact cause of this condition is not known. What increases the risk? You may be at greater risk for osteoporosis if you:  Have a family history of the condition.  Have poor nutrition.  Use steroid medicines, such as prednisone.  Are female.  Are age 30 or older.  Smoke or have a history of smoking.  Are not physically active (are sedentary).  Are white (Caucasian) or of Asian descent.  Have a small body frame.  Take certain medicines, such as antiseizure medicines. What are the signs or symptoms? A fracture might be the first sign of osteoporosis, especially if the fracture results from a fall or injury that usually would not cause a bone  to break. Other signs and symptoms include:  Pain in the neck or low back.  Stooped posture.  Loss of height. How is this diagnosed? This condition may be diagnosed based on:  Your medical history.  A physical exam.  A bone mineral density test, also called a DXA or DEXA test (dual-energy X-ray absorptiometry test). This test uses X-rays to measure the amount of minerals in your bones. How is this treated? The goal of treatment is to strengthen your bones and lower your risk for a fracture. Treatment may involve:  Making lifestyle changes, such as: ? Including foods with more calcium and vitamin D in your diet. ? Doing weight-bearing and muscle-strengthening exercises. ? Stopping tobacco use. ? Limiting alcohol intake.  Taking medicine to slow the process of bone loss or to increase bone density.  Taking daily supplements of calcium and vitamin D.  Taking hormone replacement medicines, such as estrogen for women and testosterone for men.  Monitoring your levels of calcium and vitamin D. Follow these instructions at home:  Activity  Exercise as told by your health care provider. Ask your health care provider what exercises and activities are safe for you. You should do: ? Exercises that make you work against gravity (weight-bearing exercises), such as tai chi, yoga, or walking. ? Exercises to strengthen muscles, such as lifting weights. Lifestyle  Limit alcohol intake to no more than 1 drink a day for  nonpregnant women and 2 drinks a day for men. One drink equals 12 oz of beer, 5 oz of wine, or 1 oz of hard liquor.  Do not use any products that contain nicotine or tobacco, such as cigarettes and e-cigarettes. If you need help quitting, ask your health care provider. Preventing falls  Use devices to help you move around (mobility aids) as needed, such as canes, walkers, scooters, or crutches.  Keep rooms well-lit and clutter-free.  Remove tripping hazards from  walkways, including cords and throw rugs.  Install grab bars in bathrooms and safety rails on stairs.  Use rubber mats in the bathroom and other areas that are often wet or slippery.  Wear closed-toe shoes that fit well and support your feet. Wear shoes that have rubber soles or low heels.  Review your medicines with your health care provider. Some medicines can cause dizziness or changes in blood pressure, which can increase your risk of falling. General instructions  Include calcium and vitamin D in your diet. Calcium is important for bone health, and vitamin D helps your body to absorb calcium. Good sources of calcium and vitamin D include: ? Certain fatty fish, such as salmon and tuna. ? Products that have calcium and vitamin D added to them (fortified products), such as fortified cereals. ? Egg yolks. ? Cheese. ? Liver.  Take over-the-counter and prescription medicines only as told by your health care provider.  Keep all follow-up visits as told by your health care provider. This is important. Contact a health care provider if:  You have never been screened for osteoporosis and you are: ? A woman who is age 52 or older. ? A man who is age 33 or older. Get help right away if:  You fall or injure yourself. Summary  Osteoporosis is thinning and loss of density in your bones. This makes bones more brittle and fragile and more likely to break (fracture),even with minor falls.  The goal of treatment is to strengthen your bones and reduce your risk for a fracture.  Include calcium and vitamin D in your diet. Calcium is important for bone health, and vitamin D helps your body to absorb calcium.  Talk with your health care provider about screening for osteoporosis if you are a woman who is age 22 or older, or a man who is age 2 or older. This information is not intended to replace advice given to you by your health care provider. Make sure you discuss any questions you have with  your health care provider. Document Revised: 03/20/2017 Document Reviewed: 01/30/2017 Elsevier Patient Education  2020 [employer].

## 2019-10-04 LAB — LIPID PANEL
Cholesterol: 170 mg/dL (ref <200)
HDL: 57 mg/dL (ref >=50)
LDL Cholesterol (Calc): 100 mg/dL (calc) — ABNORMAL HIGH
Non-HDL Cholesterol (Calc): 113 mg/dL (calc) (ref <130)
Total CHOL/HDL Ratio: 3 (calc) (ref <5.0)
Triglycerides: 50 mg/dL (ref <150)

## 2019-10-04 LAB — COMPLETE METABOLIC PANEL WITH GFR
AG Ratio: 1.6 (calc) (ref 1.0–2.5)
ALT: 18 U/L (ref 6–29)
AST: 22 U/L (ref 10–35)
Albumin: 4.1 g/dL (ref 3.6–5.1)
Alkaline phosphatase (APISO): 79 U/L (ref 37–153)
BUN: 16 mg/dL (ref 7–25)
CO2: 31 mmol/L (ref 20–32)
Calcium: 9.7 mg/dL (ref 8.6–10.4)
Chloride: 103 mmol/L (ref 98–110)
Creat: 0.68 mg/dL (ref 0.60–0.88)
GFR, Est African American: 96 mL/min/{1.73_m2} (ref >=60)
GFR, Est Non African American: 83 mL/min/{1.73_m2} (ref >=60)
Globulin: 2.5 g/dL (calc) (ref 1.9–3.7)
Glucose, Bld: 82 mg/dL (ref 65–99)
Potassium: 4.6 mmol/L (ref 3.5–5.3)
Sodium: 142 mmol/L (ref 135–146)
Total Bilirubin: 0.6 mg/dL (ref 0.2–1.2)
Total Protein: 6.6 g/dL (ref 6.1–8.1)

## 2019-10-04 LAB — CBC WITH DIFFERENTIAL/PLATELET
Absolute Monocytes: 411 cells/uL (ref 200–950)
Basophils Absolute: 52 cells/uL (ref 0–200)
Basophils Relative: 1 %
Eosinophils Absolute: 99 cells/uL (ref 15–500)
Eosinophils Relative: 1.9 %
HCT: 38.9 % (ref 35.0–45.0)
Hemoglobin: 12.9 g/dL (ref 11.7–15.5)
Lymphs Abs: 1388 cells/uL (ref 850–3900)
MCH: 30.5 pg (ref 27.0–33.0)
MCHC: 33.2 g/dL (ref 32.0–36.0)
MCV: 92 fL (ref 80.0–100.0)
MPV: 12.1 fL (ref 7.5–12.5)
Monocytes Relative: 7.9 %
Neutro Abs: 3250 cells/uL (ref 1500–7800)
Neutrophils Relative %: 62.5 %
Platelets: 195 10*3/uL (ref 140–400)
RBC: 4.23 10*6/uL (ref 3.80–5.10)
RDW: 12.9 % (ref 11.0–15.0)
Total Lymphocyte: 26.7 %
WBC: 5.2 10*3/uL (ref 3.8–10.8)

## 2019-10-04 LAB — MAGNESIUM: Magnesium: 2.1 mg/dL (ref 1.5–2.5)

## 2019-10-04 LAB — TSH: TSH: 0.62 mIU/L (ref 0.40–4.50)

## 2019-10-07 ENCOUNTER — Other Ambulatory Visit: Payer: Self-pay | Admitting: Adult Health

## 2019-10-07 DIAGNOSIS — Z1231 Encounter for screening mammogram for malignant neoplasm of breast: Secondary | ICD-10-CM

## 2019-11-09 DIAGNOSIS — D2262 Melanocytic nevi of left upper limb, including shoulder: Secondary | ICD-10-CM | POA: Diagnosis not present

## 2019-11-09 DIAGNOSIS — D485 Neoplasm of uncertain behavior of skin: Secondary | ICD-10-CM | POA: Diagnosis not present

## 2019-11-09 DIAGNOSIS — D225 Melanocytic nevi of trunk: Secondary | ICD-10-CM | POA: Diagnosis not present

## 2019-11-09 DIAGNOSIS — D224 Melanocytic nevi of scalp and neck: Secondary | ICD-10-CM | POA: Diagnosis not present

## 2019-11-09 DIAGNOSIS — D2261 Melanocytic nevi of right upper limb, including shoulder: Secondary | ICD-10-CM | POA: Diagnosis not present

## 2019-11-09 DIAGNOSIS — L400 Psoriasis vulgaris: Secondary | ICD-10-CM | POA: Diagnosis not present

## 2019-11-09 DIAGNOSIS — D1801 Hemangioma of skin and subcutaneous tissue: Secondary | ICD-10-CM | POA: Diagnosis not present

## 2019-11-09 DIAGNOSIS — L821 Other seborrheic keratosis: Secondary | ICD-10-CM | POA: Diagnosis not present

## 2019-11-22 DIAGNOSIS — L988 Other specified disorders of the skin and subcutaneous tissue: Secondary | ICD-10-CM | POA: Diagnosis not present

## 2019-11-22 DIAGNOSIS — D485 Neoplasm of uncertain behavior of skin: Secondary | ICD-10-CM | POA: Diagnosis not present

## 2019-12-14 ENCOUNTER — Ambulatory Visit: Payer: Medicare HMO | Admitting: Physician Assistant

## 2019-12-28 ENCOUNTER — Ambulatory Visit
Admission: RE | Admit: 2019-12-28 | Discharge: 2019-12-28 | Disposition: A | Discharge status: Home / Self Care | Payer: Medicare HMO | Source: Ambulatory Visit | Attending: Adult Health | Admitting: Adult Health

## 2019-12-28 ENCOUNTER — Other Ambulatory Visit: Payer: Self-pay

## 2019-12-28 DIAGNOSIS — Z1231 Encounter for screening mammogram for malignant neoplasm of breast: Secondary | ICD-10-CM

## 2019-12-28 DIAGNOSIS — M810 Age-related osteoporosis without current pathological fracture: Secondary | ICD-10-CM

## 2019-12-28 DIAGNOSIS — Z780 Asymptomatic menopausal state: Secondary | ICD-10-CM | POA: Diagnosis not present

## 2019-12-29 ENCOUNTER — Other Ambulatory Visit: Payer: Self-pay | Admitting: Adult Health

## 2019-12-29 DIAGNOSIS — M810 Age-related osteoporosis without current pathological fracture: Secondary | ICD-10-CM

## 2020-01-12 ENCOUNTER — Encounter: Payer: Self-pay | Admitting: Physician Assistant

## 2020-01-12 ENCOUNTER — Ambulatory Visit (INDEPENDENT_AMBULATORY_CARE_PROVIDER_SITE_OTHER): Payer: Medicare HMO | Admitting: Physician Assistant

## 2020-01-12 ENCOUNTER — Other Ambulatory Visit: Payer: Self-pay

## 2020-01-12 VITALS — BP 110/80 | HR 58 | Temp 97.4°F | Wt 126.8 lb

## 2020-01-12 DIAGNOSIS — E782 Mixed hyperlipidemia: Secondary | ICD-10-CM | POA: Diagnosis not present

## 2020-01-12 DIAGNOSIS — R030 Elevated blood-pressure reading, without diagnosis of hypertension: Secondary | ICD-10-CM

## 2020-01-12 NOTE — Progress Notes (Signed)
Patient ID: Madison Gonzalez, female   DOB: 1940-11-28, 79 y.o.   MRN: [id number] FOLLOW UP  Assessment:    Psoriasis -  better at this time  Hyperlipidemia Continue medications Continue low cholesterol diet and exercise.  Check lipid panel.   Elevated BP w/o Dx HTN Monitor blood pressure at home; call if consistently over 130/80 Continue DASH diet.   Reminder to go to the ER if any CP, SOB, nausea, dizziness, severe HA, changes vision/speech, left arm numbness and tingling and jaw pain.  Osteoporosis Weight bearing exercises, high calcium diet, supplement vitamin D Has follow up with endocrine  Over 30 minutes of exam, counseling, chart review, and critical decision making was performed  Future Appointments  Date Time Provider [place]  01/12/2020  2:30 PM Vicie Mutters, PA-C [facility] None  01/31/2020 11:20 AM Philemon Kingdom, MD [facility] None  04/18/2020  2:00 PM Unk Pinto, MD [facility] None  10/04/2020 10:00 AM Liane Comber, NP [facility] None    Subjective:   Madison Gonzalez is a 79 y.o. female who presents for Medicare Annual Wellness Visit and 3 month follow up on hypertension, glucose management, hyperlipidemia, vitamin D def.   [place]ives on [place] acres with [place], [place], just resealed her driveway, stays very active on her land.   She has L hip avascular necrosis per xray 12/2018 obtained for pelvic pain, saw Dr. Mayer Camel who recommended monitoring, if progressive/severe pain would have hip replacement. She had a recent DEXA that showed severe osteoporosis, referred to Dr. Cruzita Lederer for management.   BMI is Body mass index is 21.26 kg/m., she has been working on diet and exercise, very active around her yard, lives on [place] acres which she cares for by herself. Does ride stationary bike occasionally.  Wt Readings from Last 3 Encounters:  01/12/20 126 lb 12.8 oz (57.5 kg)  10/03/19 135 lb 12.8 oz (61.6 kg)  03/21/19 136 lb  9.6 oz (62 kg)   Her blood pressure has been controlled at home, today their BP is BP: 110/80 She does not workout. She denies chest pain, shortness of breath, dizziness.   She is on cholesterol medication (zetia, lopid) and denies myalgias. Her cholesterol is not at goal. The cholesterol last visit was:   Lab Results  Component Value Date   CHOL 170 10/03/2019   HDL 57 10/03/2019   LDLCALC 100 (H) 10/03/2019   TRIG 50 10/03/2019   CHOLHDL 3.0 10/03/2019   She has been working on diet and exercise for glucose management, and denies foot ulcerations, hyperglycemia, hypoglycemia , increased appetite, nausea, paresthesia of the feet, polydipsia, polyuria, visual disturbances, vomiting and weight loss. Last A1C in the office was:  Lab Results  Component Value Date   HGBA1C 5.5 03/21/2019   Last GFR Lab Results  Component Value Date   GFRNONAA 83 10/03/2019   Patient is on Vitamin D supplement. Lab Results  Component Value Date   VD25OH 73 03/21/2019      Medication Review   Current Outpatient Medications (Cardiovascular):  .  ezetimibe (ZETIA) 10 MG tablet, Take 1 tablet Daily for Cholesterol .  gemfibrozil (LOPID) 600 MG tablet, Take 1 tablet 2 x /day with a Meal for Cholesterol .  cholestyramine (QUESTRAN) 4 g packet, Take 1 packet by mouth 4 (four) times daily as needed.     Current Outpatient Medications (Other):  Marland Kitchen  Calcium Carbonate-Vitamin D (CALCIUM + D PO), Take 1 tablet by mouth daily. Marland Kitchen  Cholecalciferol (VITAMIN D3) 5000 UNITS TABS, Take 5,000-10,000 Units by mouth daily. Take 2 capsules (10,000 units) on Tuesday, Thursday, Saturday, take 1 capsule (5,000 units) on Sunday, Monday, Wednesday, Friday .  halobetasol (ULTRAVATE) 0.05 % ointment, Apply topically 2 (two) times daily. Marland Kitchen  MAGNESIUM PO, Take 1 tablet by mouth daily.  .  Multiple Vitamin (MULTIVITAMIN WITH MINERALS) TABS tablet, Take 1 tablet by mouth daily.  Current Problems (verified) Patient Active  Problem List   Diagnosis Date Noted  . Aseptic necrosis of bone of hip, left ([place]) 10/03/2019  . FHx: heart disease 02/23/2018  . Body mass index (BMI) of 23.0-23.9 in adult 03/31/2015  . Abnormal glucose 03/29/2015  . Elevated BP w/o Dx HTN 06/29/2013  . Hyperlipidemia, mixed 06/29/2013  . Vitamin D deficiency 06/29/2013  . IBS (irritable bowel syndrome)   . Psoriasis   . Osteoporosis   . History of ITP     Objective:   Today's Vitals   01/12/20 1356  BP: 110/80  Pulse: (!) 58  Temp: (!) 97.4 F (36.3 C)  SpO2: 93%  Weight: 126 lb 12.8 oz (57.5 kg)   Body mass index is 21.26 kg/m.  General appearance: alert, no distress, WD/WN,  female HEENT: normocephalic, sclerae anicteric, TMs pearly, nares patent, no discharge or erythema, pharynx normal Oral cavity: MMM, no lesions Neck: supple, no lymphadenopathy, no thyromegaly, no masses Heart: RRR, normal S1, S2, no murmurs Lungs: CTA bilaterally, no wheezes, rhonchi, or rales Abdomen: +bs, soft, non tender, non distended, no masses, no hepatomegaly, no splenomegaly Musculoskeletal: nontender, no swelling, no obvious deformity Extremities: no edema, no cyanosis, no clubbing Pulses: 2+ symmetric, upper and lower extremities, normal cap refill Neurological: alert, oriented x 3, CN2-12 intact, strength normal upper extremities and lower extremities, sensation normal throughout, DTRs 2+ throughout, no cerebellar signs, gait normal Psychiatric: normal affect, behavior normal, pleasant    Vicie Mutters, PA-C   01/12/2020

## 2020-01-13 LAB — CBC WITH DIFFERENTIAL/PLATELET
Absolute Monocytes: 429 cells/uL (ref 200–950)
Basophils Absolute: 48 cells/uL (ref 0–200)
Basophils Relative: 0.9 %
Eosinophils Absolute: 138 cells/uL (ref 15–500)
Eosinophils Relative: 2.6 %
HCT: 37.5 % (ref 35.0–45.0)
Hemoglobin: 12.2 g/dL (ref 11.7–15.5)
Lymphs Abs: 1585 cells/uL (ref 850–3900)
MCH: 30.7 pg (ref 27.0–33.0)
MCHC: 32.5 g/dL (ref 32.0–36.0)
MCV: 94.5 fL (ref 80.0–100.0)
MPV: 11.3 fL (ref 7.5–12.5)
Monocytes Relative: 8.1 %
Neutro Abs: 3101 cells/uL (ref 1500–7800)
Neutrophils Relative %: 58.5 %
Platelets: 226 10*3/uL (ref 140–400)
RBC: 3.97 10*6/uL (ref 3.80–5.10)
RDW: 13.2 % (ref 11.0–15.0)
Total Lymphocyte: 29.9 %
WBC: 5.3 10*3/uL (ref 3.8–10.8)

## 2020-01-13 LAB — COMPLETE METABOLIC PANEL WITH GFR
AG Ratio: 1.8 (calc) (ref 1.0–2.5)
ALT: 14 U/L (ref 6–29)
AST: 19 U/L (ref 10–35)
Albumin: 4.4 g/dL (ref 3.6–5.1)
Alkaline phosphatase (APISO): 69 U/L (ref 37–153)
BUN: 22 mg/dL (ref 7–25)
CO2: 31 mmol/L (ref 20–32)
Calcium: 9.6 mg/dL (ref 8.6–10.4)
Chloride: 105 mmol/L (ref 98–110)
Creat: 0.68 mg/dL (ref 0.60–0.88)
GFR, Est African American: 96 mL/min/{1.73_m2} (ref >=60)
GFR, Est Non African American: 83 mL/min/{1.73_m2} (ref >=60)
Globulin: 2.4 g/dL (calc) (ref 1.9–3.7)
Glucose, Bld: 85 mg/dL (ref 65–99)
Potassium: 4.5 mmol/L (ref 3.5–5.3)
Sodium: 141 mmol/L (ref 135–146)
Total Bilirubin: 0.7 mg/dL (ref 0.2–1.2)
Total Protein: 6.8 g/dL (ref 6.1–8.1)

## 2020-01-13 LAB — LIPID PANEL
Cholesterol: 188 mg/dL (ref <200)
HDL: 69 mg/dL (ref >=50)
LDL Cholesterol (Calc): 110 mg/dL (calc) — ABNORMAL HIGH
Non-HDL Cholesterol (Calc): 119 mg/dL (calc) (ref <130)
Total CHOL/HDL Ratio: 2.7 (calc) (ref <5.0)
Triglycerides: 32 mg/dL (ref <150)

## 2020-01-13 LAB — TSH: TSH: 1.37 mIU/L (ref 0.40–4.50)

## 2020-01-30 DIAGNOSIS — R69 Illness, unspecified: Secondary | ICD-10-CM | POA: Diagnosis not present

## 2020-01-31 ENCOUNTER — Other Ambulatory Visit: Payer: Self-pay

## 2020-01-31 ENCOUNTER — Telehealth: Payer: Self-pay

## 2020-01-31 ENCOUNTER — Ambulatory Visit: Payer: Medicare HMO | Admitting: Internal Medicine

## 2020-01-31 ENCOUNTER — Encounter: Payer: Self-pay | Admitting: Internal Medicine

## 2020-01-31 VITALS — BP 140/80 | HR 56 | Ht 65.0 in | Wt 127.2 lb

## 2020-01-31 DIAGNOSIS — E559 Vitamin D deficiency, unspecified: Secondary | ICD-10-CM

## 2020-01-31 DIAGNOSIS — M810 Age-related osteoporosis without current pathological fracture: Secondary | ICD-10-CM | POA: Diagnosis not present

## 2020-01-31 LAB — URIC ACID: Uric Acid, Serum: 4.6 mg/dL (ref 2.4–7.0)

## 2020-01-31 NOTE — Patient Instructions (Addendum)
Please stop at the lab.  How Can I Prevent Falls? Men and women with osteoporosis need to take care not to fall down. Falls can break bones. Some reasons people fall are: Poor vision  Poor balance  Certain diseases that affect how you walk  Some types of medicine, such as sleeping pills.  Some tips to help prevent falls outdoors are: Use a cane or walker  Wear rubber-soled shoes so you don't slip  Walk on grass when sidewalks are slippery  In winter, put salt or kitty litter on icy sidewalks.  Some ways to help prevent falls indoors are: Keep rooms free of clutter, especially on floors  Use plastic or carpet runners on slippery floors  Wear low-heeled shoes that provide good support  Do not walk in socks, stockings, or slippers  Be sure carpets and area rugs have skid-proof backs or are tacked to the floor  Be sure stairs are well lit and have rails on both sides  Put grab bars on bathroom walls near tub, shower, and toilet  Use a rubber bath mat in the shower or tub  Keep a flashlight next to your bed  Use a sturdy step stool with a handrail and wide steps  Add more lights in rooms (and night lights) Buy a cordless phone to keep with you so that you don't have to rush to the phone       when it rings and so that you can call for help if you fall.   (adapted from http://www.niams.NightlifePreviews.se)  Exercise for Strong Bones (from [place]) There are two types of exercises that are important for building and maintaining bone density:  weight-bearing and muscle-strengthening exercises. Weight-bearing Exercises These exercises include activities that make you move against gravity while staying upright. Weight-bearing exercises can be high-impact or low-impact. High-impact weight-bearing exercises help build bones and keep them strong. If you have broken a bone due to osteoporosis or are at risk of breaking a bone, you may  need to avoid high-impact exercises. If you're not sure, you should check with your healthcare provider. Examples of high-impact weight-bearing exercises are: . Dancing . Doing high-impact aerobics . Hiking . Jogging/running . Jumping Rope . Stair climbing . Tennis Low-impact weight-bearing exercises can also help keep bones strong and are a safe alternative if you cannot do high-impact exercises. Examples of low-impact weight-bearing exercises are: . Using elliptical training machines . Doing low-impact aerobics . Using stair-step machines . Fast walking on a treadmill or outside Muscle-Strengthening Exercises These exercises include activities where you move your body, a weight or some other resistance against gravity. They are also known as resistance exercises and include: . Lifting weights . Using elastic exercise bands . Using weight machines . Lifting your own body weight . Functional movements, such as standing and rising up on your toes Yoga and Pilates can also improve strength, balance and flexibility. However, certain positions may not be safe for people with osteoporosis or those at increased risk of broken bones. For example, exercises that have you bend forward may increase the chance of breaking a bone in the spine. A physical therapist should be able to help you learn which exercises are safe and appropriate for you. Non-Impact Exercises Non-impact exercises can help you to improve balance, posture and how well you move in everyday activities. These exercises can also help to increase muscle strength and decrease the risk of falls and broken bones. Some of these exercises include: . Balance exercises  that strengthen your legs and test your balance, such as Tai Chi, can decrease your risk of falls. . Posture exercises that improve your posture and reduce rounded or "sloping" shoulders can help you decrease the chance of breaking a bone, especially in the spine. . Functional  exercises that improve how well you move can help you with everyday activities and decrease your chance of falling and breaking a bone. For example, if you have trouble getting up from a chair or climbing stairs, you should do these activities as exercises. A physical therapist can teach you balance, posture and functional exercises. Starting a New Exercise Program If you haven't exercised regularly for a while, check with your healthcare provider before beginning a new exercise program--particularly if you have health problems such as heart disease, diabetes or high blood pressure. If you're at high risk of breaking a bone, you should work with a physical therapist to develop a safe exercise program. Once you have your healthcare provider's approval, start slowly. If you've already broken bones in the spine because of osteoporosis, be very careful to avoid activities that require reaching down, bending forward, rapid twisting motions, heavy lifting and those that increase your chance of a fall. As you get started, your muscles may feel sore for a day or two after you exercise. If soreness lasts longer, you may be working too hard and need to ease up. Exercises should be done in a pain-free range of motion. How Much Exercise Do You Need? Weight-bearing exercises 30 minutes on most days of the week. Do a 30-minutesession or multiple sessions spread out throughout the day. The benefits to your bones are the same.   Muscle-strengthening exercises Two to three days per week. If you don't have much time for strengthening/resistance training, do small amounts at a time. You can do just one body part each day. For example do arms one day, legs the next and trunk the next. You can also spread these exercises out during your normal day.  Balance, posture and functional exercises Every day or as often as needed. You may want to focus on one area more than the others. If you have fallen or lose your balance, spend  time doing balance exercises. If you are getting rounded shoulders, work more on posture exercises. If you have trouble climbing stairs or getting up from the couch, do more functional exercises. You can also perform these exercises at one time or spread them during your day. Work with a phyiscal therapist to learn the right exercises for you.   Denosumab: Patient drug information (Up-to-date) Copyright [phone] [place] rights reserved.  Brand Names: [place]  ProliaDelton See What is this drug used for?  .It is used to treat soft, brittle bones (osteoporosis).  .It is used for bone growth.  .It is used when treating some cancers.  .It may be given to you for other reasons. Talk with the doctor. What do I need to tell my doctor BEFORE I take this drug?  All products:  .If you have an allergy to denosumab or any other part of this drug.  .If you are allergic to any drugs like this one, any other drugs, foods, or other substances. Tell your doctor about the allergy and what signs you had, like rash; hives; itching; shortness of breath; wheezing; cough; swelling of face, lips, tongue, or throat; or any other signs.  .If you have low calcium levels.  ProliaT:  .If you are pregnant or may  be pregnant. Do not take this drug if you are pregnant.  This is not a list of all drugs or health problems that interact with this drug.  Tell your doctor and pharmacist about all of your drugs (prescription or OTC, natural products, vitamins) and health problems. You must check to make sure that it is safe for you to take this drug with all of your drugs and health problems. Do not start, stop, or change the dose of any drug without checking with your doctor. What are some things I need to know or do while I take this drug?  All products:  .Tell dentists, surgeons, and other doctors that you use this drug.  .This drug may raise the chance of a broken leg. Talk with your doctor.  .Have your blood work  checked. Talk with your doctor.  .Have a bone density test. Talk with your doctor.  .Take calcium and vitamin D as you were told by your doctor.  .Have a dental exam before starting this drug.  .Take good care of your teeth. See a dentist often.  .If you smoke, talk with your doctor.  .Do not give to a child. Talk with your doctor.  .Tell your doctor if you are breast-feeding. You will need to talk about any risks to your baby.  Xgeva:  .This drug may cause harm to the unborn baby if you take it while you are pregnant. If you get pregnant while taking this drug, call your doctor right away.  ProliaT:  .Very bad infections have been reported with use of this drug. If you have any infection, are taking antibiotics now or in the recent past, or have many infections, talk with your doctor.  .You may have more chance of getting an infection. Wash hands often. Stay away from people with infections, colds, or flu.  .Use birth control that you can trust to prevent pregnancy while taking this drug.  .If you are a man and your sex partner is pregnant or gets pregnant at any time while you are being treated, talk with your doctor. What are some side effects that I need to call my doctor about right away?  WARNING/CAUTION: Even though it may be rare, some people may have very bad and sometimes deadly side effects when taking a drug. Tell your doctor or get medical help right away if you have any of the following signs or symptoms that may be related to a very bad side effect:  All products:  .Signs of an allergic reaction, like rash; hives; itching; red, swollen, blistered, or peeling skin with or without fever; wheezing; tightness in the chest or throat; trouble breathing or talking; unusual hoarseness; or swelling of the mouth, face, lips, tongue, or throat.  .Signs of low calcium levels like muscle cramps or spasms, numbness and tingling, or seizures.  .Mouth sores.  .Any new or strange groin, hip, or  thigh pain.  .This drug may cause jawbone problems. The chance may be higher the longer you take this drug. The chance may be higher if you have cancer, dental problems, dentures that do not fit well, anemia, blood clotting problems, or an infection. The chance may also be higher if you are having dental work or if you are getting chemo, some steroid drugs, or radiation. Call your doctor right away if you have jaw swelling or pain.  Xgeva:  .Not hungry.  .Muscle pain or weakness.  .Seizures.  .Shortness of breath.  ProliaT:  .Signs of infection.  These include a fever of 100.52F (38C) or higher, chills, very bad sore throat, ear or sinus pain, cough, more sputum or change in color of sputum, pain with passing urine, mouth sores, wound that will not heal, or anal itching or pain.  .Signs of a pancreas problem (pancreatitis) like very bad stomach pain, very bad back pain, or very bad upset stomach or throwing up.  .Chest pain.  .A heartbeat that does not feel normal.  .Very bad skin irritation.  .Feeling very tired or weak.  .Bladder pain or pain when passing urine or change in how much urine is passed.  .Passing urine often.  .Swelling in the arms or legs. What are some other side effects of this drug?  All drugs may cause side effects. However, many people have no side effects or only have minor side effects. Call your doctor or get medical help if any of these side effects or any other side effects bother you or do not go away:  Xgeva:  .Feeling tired or weak.  Marland KitchenHeadache.  Marland KitchenUpset stomach or throwing up.  .Loose stools (diarrhea).  .Cough.  ProliaT:  .Back pain.  .Muscle or joint pain.  .Sore throat.  .Runny nose.  .Pain in arms or legs.  These are not all of the side effects that may occur. If you have questions about side effects, call your doctor. Call your doctor for medical advice about side effects.  You may report side effects to your national health agency. How is this drug  best taken?  Use this drug as ordered by your doctor. Read and follow the dosing on the label closely.  .It is given as a shot into the fatty part of the skin. What do I do if I miss a dose?  .Call the doctor to find out what to do. How do I store and/or throw out this drug?  Marland KitchenThis drug will be given to you in a hospital or doctor's office. You will not store it at home.  Marland KitchenKeep all drugs out of the reach of children and pets.  .Check with your pharmacist about how to throw out unused drugs.  General drug facts  .If your symptoms or health problems do not get better or if they become worse, call your doctor.  .Do not share your drugs with others and do not take anyone else's drugs.  Marland KitchenKeep a list of all your drugs (prescription, natural products, vitamins, OTC) with you. Give this list to your doctor.  .Talk with the doctor before starting any new drug, including prescription or OTC, natural products, or vitamins.  .Some drugs may have another patient information leaflet. If you have any questions about this drug, please talk with your doctor, pharmacist, or other health care provider.  .If you think there has been an overdose, call your poison control center or get medical care right away. Be ready to tell or show what was taken, how much, and when it happened.  Teriparatide injection What is this medicine? TERIPARATIDE (terr ih PAR a tyd) increases bone mass and strength. It helps to make healthy bone and to slow bone loss in people with osteoporosis. This medicine helps prevent bone fractures. This medicine may be used for other purposes; ask your health care provider or pharmacist if you have questions. COMMON BRAND NAME(S): Forteo What should I tell my health care provider before I take this medicine? They need to know if you have any of these conditions:  bone disease other than osteoporosis  high levels of calcium in the blood  history of cancer in the bone  kidney  stone  Paget's disease  parathyroid disease  receiving radiation therapy  an unusual or allergic reaction to teriparatide, other medicines, foods, dyes, or preservatives  pregnant or trying to get pregnant  breast-feeding How should I use this medicine? This medicine is for injection under the skin. You will be taught how to prepare and give this medicine. Use exactly as directed. Take your medicine at regular intervals. Do not take your medicine more often than directed. It is important that you put your used needles and pens in a special sharps container. Do not put them in a trash can. If you do not have a sharps container, call your pharmacist or health care provider to get one. A special MedGuide will be given to you by the pharmacist with each prescription and refill. Be sure to read this information carefully each time. Talk to your pediatrician regarding the use of this medicine in children. Special care may be needed. Overdosage: If you think you have taken too much of this medicine contact a poison control center or emergency room at once. NOTE: This medicine is only for you. Do not share this medicine with others. What if I miss a dose? If you miss a dose, take it as soon as you can. If it is almost time for your next dose, take only that dose. Do not take double or extra doses. What may interact with this medicine?  digoxin This list may not describe all possible interactions. Give your health care provider a list of all the medicines, herbs, non-prescription drugs, or dietary supplements you use. Also tell them if you smoke, drink alcohol, or use illegal drugs. Some items may interact with your medicine. What should I watch for while using this medicine? Visit your doctor or health care professional for regular checks on your progress. Your doctor may order blood tests and other tests to see how you are doing. You should make sure you get enough calcium and vitamin D while  you are taking this medicine, unless your doctor tells you not to. Discuss the foods you eat and the vitamins you take with your health care professional. Dennis Bast may get drowsy or dizzy. Do not drive, use machinery, or do anything that needs mental alertness until you know how this medicine affects you. Do not stand or sit up quickly, especially if you are an older patient. This reduces the risk of dizzy or fainting spells. Talk to your doctor about your risk of cancer. You may be more at risk for certain types of cancers if you take this medicine. What side effects may I notice from receiving this medicine? Side effects that you should report to your doctor or health care professional as soon as possible:  allergic reactions like skin rash, itching or hives, swelling of the face, lips, or tongue  blood in the urine; pain in the lower back or side; pain when urinating  signs and symptoms of low blood pressure like dizziness; feeling faint or lightheaded, falls; unusually weak or tired  signs and symptoms of increased calcium like nausea; vomiting; constipation; low energy; or muscle weakness Side effects that usually do not require medical attention (report these to your doctor or health care professional if they continue or are bothersome):  headache  joint pain  nausea  pain, redness, irritation or swelling at the injection site  stomach upset This list may not describe  all possible side effects. Call your doctor for medical advice about side effects. You may report side effects to FDA at 1-800-FDA-1088. Where should I keep my medicine? Keep out of the reach of children. Store the pens in a refrigerator between 2 and 8 degrees C (36 and 46 degrees F). Do not freeze. Use the pen quickly after taking out of the refrigerator and recap and return to refrigerator right after using. Protect from light. Throw away any unused medicine 28 days after the first injection from the pen. Throw away any  unopened, unused medicine after the expiration date on the label. NOTE: This sheet is a summary. It may not cover all possible information. If you have questions about this medicine, talk to your doctor, pharmacist, or health care provider.  2020 Elsevier/Gold Standard (2018-02-09 16:56:19)  Patient information (Up-to-Date): Collection of a 24-hour urine specimen  - You should collect every drop of urine during each 24-hour period. It does not matter how much or little urine is passed each time, as long as every drop is collected. - Begin the urine collection in the morning after you wake up, after you have emptied your bladder for the first time. - Urinate (empty the bladder) for the first time and flush it down the toilet. Note the exact time (eg, 6:15 AM). You will begin the urine collection at this time. - Collect every drop of urine during the day and night in an empty collection bottle. Store the bottle at room temperature or in the refrigerator. - If you need to have a bowel movement, any urine passed with the bowel movement should be collected. Try not to include feces with the urine collection. If feces does get mixed in, do not try to remove the feces from the urine collection bottle. - Finish by collecting the first urine passed the next morning, adding it to the collection bottle. This should be within ten minutes before or after the time of the first morning void on the first day (which was flushed). In this example, you would try to void between 6:05 and 6:25 on the second day. - If you need to urinate one hour before the final collection time, drink a full glass of water so that you can void again at the appropriate time. If you have to urinate 20 minutes before, try to hold the urine until the proper time. - Please note the exact time of the final collection, even if it is not the same time as when collection began on day 1. - The bottle(s) may be kept at room temperature for a day or  two, but should be kept cool or refrigerated for longer periods of time.   Please come back for a follow-up appointment in 3 months.

## 2020-01-31 NOTE — Telephone Encounter (Signed)
If patient calls back please verify if her middle initial is P or J

## 2020-01-31 NOTE — Progress Notes (Addendum)
Patient ID: Madison Gonzalez, female   DOB: 15-Jan-1941, 79 y.o.   MRN: [id number]   This visit occurred during the SARS-CoV-2 public health emergency.  Safety protocols were in place, including screening questions prior to the visit, additional usage of staff PPE, and extensive cleaning of exam room while observing appropriate contact time as indicated for disinfecting solutions.    HPI  Madison Gonzalez is a 79 y.o.-year-old female, referred by her PCP, Liane Comber, NP, for management of osteoporosis (OP).  Pt was dx with OP in ~2016.  I reviewed pt's DXA scans: Date L1-L4 T score FN T score 33% distal Radius  12/28/2019: [facility] center  L1, L2: -3.8 RFN: -3.1 LFN: -2.8 n/a  12/21/2014: Breast center  L2, L3: -3.2 n/a n/a  05/26/2002: ?  L1-L4: -2.4 n/a n/a   She denies falls.  She fell down steps  - 2015 >> patellar fractures.  She does have a history of avascular hip necrosis - discovered in 2020 as she c/o hip pain (had this for many years).  No dizziness/vertigo/orthostasis/poor vision.   Previous OP treatments:  - Fosamax - ~20 years ago >> severe fatigue and mm pain   She does have a h/o vitamin D deficiency. Reviewed available vit D levels and these have been normal: Lab Results  Component Value Date   VD25OH 73 03/21/2019   VD25OH 68 12/02/2018   VD25OH 85 08/31/2018   VD25OH 66 02/23/2018   VD25OH 75 07/10/2017   VD25OH 70 12/25/2016   VD25OH 45 12/05/2015   VD25OH 71 03/29/2015   VD25OH 65 10/17/2014   VD25OH 39 06/30/2014   Pt is on: - calcium 600 mg 3x a week - magnesium 250 mg daily - vitamin D 5000 units 4 out of 7 days and 10,000 units 3 out of 7 days + MVI 50+  Of note, she is off and on Questran. On Lomotil.  She was on Prednisone for almost a year 1990s for eye ds.   No weight bearing exercises. She was going to the [facility] before the pandemic. She is also active or doing her yard.  She does not take high vitamin A doses.  Menopause  was at 33 y/o. Husband died that year. She then developed psoriasis, ITP.  No FH of osteoporosis.  No h/o hyper/hypocalcemia or hyperparathyroidism. No h/o kidney stones. Lab Results  Component Value Date   CALCIUM 9.6 01/12/2020   CALCIUM 9.7 10/03/2019   CALCIUM 9.8 03/21/2019   CALCIUM 9.3 12/02/2018   CALCIUM 9.4 08/31/2018   CALCIUM 10.0 05/28/2018   CALCIUM 9.7 02/23/2018   CALCIUM 9.9 10/28/2017   CALCIUM 9.4 07/10/2017   CALCIUM 9.8 04/06/2017   No h/o thyrotoxicosis. Reviewed TSH recent levels:  Lab Results  Component Value Date   TSH 1.37 01/12/2020   TSH 0.62 10/03/2019   TSH 0.99 03/21/2019   TSH 0.95 12/02/2018   TSH 1.18 08/31/2018   No h/o CKD. Last BUN/Cr: Lab Results  Component Value Date   BUN 22 01/12/2020   CREATININE 0.68 01/12/2020   She also has HL.  ROS: Constitutional: no weight gain, no weight loss, no fatigue, no subjective hyperthermia, no subjective hypothermia, no nocturia Eyes: no blurry vision, no xerophthalmia ENT: no sore throat, no nodules palpated in neck, no dysphagia, no odynophagia, no hoarseness, no tinnitus, no hypoacusis Cardiovascular: no CP, no SOB, no palpitations, no leg swelling Respiratory: no cough, no SOB, no wheezing Gastrointestinal: no N, no V, + D (IBS), no  C, no acid reflux Musculoskeletal: no muscle, no joint aches Skin: no rash, + hair loss Neurological: no tremors, no numbness or tingling/no dizziness/no HAs Psychiatric: no depression, no anxiety  I reviewed pt's medications, allergies, PMH, social hx, family hx, and changes were documented in the history of present illness. Otherwise, unchanged from my initial visit note.  Past Medical History:  Diagnosis Date  . History of ITP   . Hyperlipidemia   . IBS (irritable bowel syndrome)   . Osteopenia   . Psoriasis    Past Surgical History:  Procedure Laterality Date  . ABDOMINAL HYSTERECTOMY    . APPENDECTOMY    . BREAST SURGERY Right    Biospy, benign   . EYE SURGERY Bilateral    Cataracts  . EYE SURGERY     Lense implants  . TUBAL LIGATION     Social History   Socioeconomic History  . Marital status: Widowed    Spouse name: Not on file  . Number of children: Not on file  . Years of education: Not on file  . Highest education level: Not on file  Occupational History  . Not on file  Tobacco Use  . Smoking status: Never Smoker  . Smokeless tobacco: Never Used  Substance and Sexual Activity  . Alcohol use: Yes    Comment: occasionl  . Drug use: No  . Sexual activity: Never  Other Topics Concern  . Not on file  Social History Narrative  . Not on file   Social Determinants of Health   Financial Resource Strain:   . Difficulty of Paying Living Expenses: Not on file  Food Insecurity:   . Worried About Charity fundraiser in the Last Year: Not on file  . Ran Out of Food in the Last Year: Not on file  Transportation Needs:   . Lack of Transportation (Medical): Not on file  . Lack of Transportation (Non-Medical): Not on file  Physical Activity:   . Days of Exercise per Week: Not on file  . Minutes of Exercise per Session: Not on file  Stress:   . Feeling of Stress : Not on file  Social Connections:   . Frequency of Communication with Friends and Family: Not on file  . Frequency of Social Gatherings with Friends and Family: Not on file  . Attends Religious Services: Not on file  . Active Member of Clubs or Organizations: Not on file  . Attends Archivist Meetings: Not on file  . Marital Status: Not on file  Intimate Partner Violence:   . Fear of Current or Ex-Partner: Not on file  . Emotionally Abused: Not on file  . Physically Abused: Not on file  . Sexually Abused: Not on file   Current Outpatient Medications on File Prior to Visit  Medication Sig Dispense Refill  . Calcium Carbonate-Vitamin D (CALCIUM + D PO) Take 1 tablet by mouth daily.    . Cholecalciferol (VITAMIN D3) 5000 UNITS TABS Take  5,000-10,000 Units by mouth daily. Take 2 capsules (10,000 units) on Tuesday, Thursday, Saturday, take 1 capsule (5,000 units) on Sunday, Monday, Wednesday, Friday    . cholestyramine (QUESTRAN) 4 g packet Take 1 packet by mouth 4 (four) times daily as needed. 60 each 11  . ezetimibe (ZETIA) 10 MG tablet Take 1 tablet Daily for Cholesterol 90 tablet 3  . gemfibrozil (LOPID) 600 MG tablet Take 1 tablet 2 x /day with a Meal for Cholesterol 180 tablet 3  . halobetasol (ULTRAVATE)  0.05 % ointment Apply topically 2 (two) times daily. 50 g 1  . MAGNESIUM PO Take 1 tablet by mouth daily.     . Multiple Vitamin (MULTIVITAMIN WITH MINERALS) TABS tablet Take 1 tablet by mouth daily.     No current facility-administered medications on file prior to visit.   Allergies  Allergen Reactions  . Atorvastatin Other (See Comments)    myalgia  . Fosamax [Alendronate Sodium] Other (See Comments)    Myalgia   Family History  Problem Relation Age of Onset  . AAA (abdominal aortic aneurysm) Mother   . Hypertension Mother   . Hypertension Father   . Heart disease Father   . Hyperlipidemia Father   . Cancer Maternal Aunt        uterine    Past Medical History:  Diagnosis Date  . History of ITP   . Hyperlipidemia   . IBS (irritable bowel syndrome)   . Osteopenia   . Psoriasis    Past Surgical History:  Procedure Laterality Date  . ABDOMINAL HYSTERECTOMY    . APPENDECTOMY    . BREAST SURGERY Right    Biospy, benign  . EYE SURGERY Bilateral    Cataracts  . EYE SURGERY     Lense implants  . TUBAL LIGATION     Social History   Socioeconomic History  . Marital status: Widowed    Spouse name: Not on file  . Number of children: Not on file  . Years of education: Not on file  . Highest education level: Not on file  Occupational History  . Not on file  Tobacco Use  . Smoking status: Never Smoker  . Smokeless tobacco: Never Used  Substance and Sexual Activity  . Alcohol use: Yes    Comment:  occasionl  . Drug use: No  . Sexual activity: Never  Other Topics Concern  . Not on file  Social History Narrative  . Not on file   Social Determinants of Health   Financial Resource Strain:   . Difficulty of Paying Living Expenses: Not on file  Food Insecurity:   . Worried About Charity fundraiser in the Last Year: Not on file  . Ran Out of Food in the Last Year: Not on file  Transportation Needs:   . Lack of Transportation (Medical): Not on file  . Lack of Transportation (Non-Medical): Not on file  Physical Activity:   . Days of Exercise per Week: Not on file  . Minutes of Exercise per Session: Not on file  Stress:   . Feeling of Stress : Not on file  Social Connections:   . Frequency of Communication with Friends and Family: Not on file  . Frequency of Social Gatherings with Friends and Family: Not on file  . Attends Religious Services: Not on file  . Active Member of Clubs or Organizations: Not on file  . Attends Archivist Meetings: Not on file  . Marital Status: Not on file  Intimate Partner Violence:   . Fear of Current or Ex-Partner: Not on file  . Emotionally Abused: Not on file  . Physically Abused: Not on file  . Sexually Abused: Not on file   Current Outpatient Medications on File Prior to Visit  Medication Sig Dispense Refill  . Calcium Carbonate-Vitamin D (CALCIUM + D PO) Take 1 tablet by mouth daily.    . Cholecalciferol (VITAMIN D3) 5000 UNITS TABS Take 5,000-10,000 Units by mouth daily. Take 2 capsules (10,000 units) on Tuesday,  Thursday, Saturday, take 1 capsule (5,000 units) on Sunday, Monday, Wednesday, Friday    . cholestyramine (QUESTRAN) 4 g packet Take 1 packet by mouth 4 (four) times daily as needed. 60 each 11  . ezetimibe (ZETIA) 10 MG tablet Take 1 tablet Daily for Cholesterol 90 tablet 3  . gemfibrozil (LOPID) 600 MG tablet Take 1 tablet 2 x /day with a Meal for Cholesterol 180 tablet 3  . halobetasol (ULTRAVATE) 0.05 % ointment Apply  topically 2 (two) times daily. 50 g 1  . MAGNESIUM PO Take 1 tablet by mouth daily.     . Multiple Vitamin (MULTIVITAMIN WITH MINERALS) TABS tablet Take 1 tablet by mouth daily.     No current facility-administered medications on file prior to visit.   Allergies  Allergen Reactions  . Atorvastatin Other (See Comments)    myalgia  . Fosamax [Alendronate Sodium] Other (See Comments)    Myalgia   Family History  Problem Relation Age of Onset  . AAA (abdominal aortic aneurysm) Mother   . Hypertension Mother   . Hypertension Father   . Heart disease Father   . Hyperlipidemia Father   . Cancer Maternal Aunt        uterine   PE: BP 140/80   Pulse (!) 56   Ht 5\' 5"  (1.651 m)   Wt 127 lb 3.2 oz (57.7 kg)   SpO2 95%   BMI 21.17 kg/m  Wt Readings from Last 3 Encounters:  01/31/20 127 lb 3.2 oz (57.7 kg)  01/12/20 126 lb 12.8 oz (57.5 kg)  10/03/19 135 lb 12.8 oz (61.6 kg)   Constitutional: Normal weight, in NAD. No kyphosis. Eyes: PERRLA, EOMI, no exophthalmos ENT: moist mucous membranes, no thyromegaly, no cervical lymphadenopathy Cardiovascular: RRR, No MRG Respiratory: CTA B Gastrointestinal: abdomen soft, NT, ND, BS+ Musculoskeletal: no deformities, strength intact in all 4 Skin: moist, warm, no rashes Neurological: no tremor with outstretched hands, DTR normal in all 4  Assessment: 1. Osteoporosis  Plan: 1. Osteoporosis - likely postmenopausal/age-related and she also has been on steroids for a year in the 1990 - Discussed about increased risk of fracture, depending on the T score, greatly increased when the T score is lower than -2.5, but it is actually a continuum and -2.5 should not be regarded as an absolute threshold. We reviewed her latest DXA scan report and images together, and I explained that based on the T scores, she has an increased risk for fractures.  - we reviewed her dietary and supplemental calcium and vitamin D intake. I recommended to make sure she  gets 1000-1200 mg of calcium daily preferentially from diet and I will check vit D today to see if she needs supplementation. - discussed fall precautions   - given handout from [facility]: weight bearing exercises - advised to do this every day or at least 5/7 days - She is not smoking or drinking>2 drinks of alcohol a day. - we discussed about the concept of  low acid eating - recommended an alkaline diet.  She has been vegetarian in the past for different periods of time, now eating some pork, also cottage cheese, yogurt; she does love really for vegetables. - We discussed about the different medication classes, benefits and side effects (including atypical fractures and ONJ in the past with bisphosphonates and Prolia - no dental workup in progress or planned -and osteosarcoma with bone anabolics).  - I explained that bisphosphonates are not the best option for her  due to previous intolerance.  First options are Teriparatide/Abaloparatide (daily subcu medication x2 years), second line is sq denosumab (Prolia) sq every 6 months (x6-10 years). I would use  or Romosozumab (monthly) as a last resort.  - Pt was given reading information about teriparatide and Prolia (in case teriparatide is not covered by his insurance), and I explained the mechanism of action and expected benefits.  - Will check the following labs today: Orders Placed This Encounter  Procedures  . VITAMIN D 25 Hydroxy (Vit-D Deficiency, Fractures)  . Calcium, urine, 24 hour  . Creatinine, urine, 24 hour  . Uric Acid  - if labs normal, will start a preauthorization for a bone anabolic (either Forteo or Tymlos) - will check a new DXA scan in 2 years after starting the above medication-  I am hoping to see an increase in bone density but ultimately, I am hoping that she does not have any more fractures, while DXA scan changes are secondary. - will see pt back in 3 months.  At that time, we will recheck her serum  calcium and also 24-hour urine calcium  Component     Latest Ref Rng & Units 01/31/2020  Vitamin D, 25-Hydroxy     30.0 - 100.0 ng/mL 65.1  Uric Acid, Serum     2.4 - 7.0 mg/dL 4.6   Normal vitamin D and uric acid.  Urine collection results pending.  Component     Latest Ref Rng & Units 02/13/2020  Creatinine, 24H Ur     0.50 - 2.15 g/24 h 0.84  Calcium, 24H Urine     mg/24 h 252 (H)  24-hour urine calcium is slightly high.  In this case, it would be probably best to start Prolia, rather than Forteo or Tymlos.We also have to be careful with her calcium replacement.  She is currently taking 1 tablet of calcium 3 times a week and I will advise her to take this daily with dinner.  Philemon Kingdom, MD PhD [facility] Endocrin[facility]

## 2020-02-01 LAB — VITAMIN D 25 HYDROXY (VIT D DEFICIENCY, FRACTURES): Vit D, 25-Hydroxy: 65.1 ng/mL (ref 30.0–100.0)

## 2020-02-02 ENCOUNTER — Encounter: Payer: Self-pay | Admitting: Internal Medicine

## 2020-02-02 ENCOUNTER — Other Ambulatory Visit: Payer: Self-pay | Admitting: Internal Medicine

## 2020-02-13 ENCOUNTER — Other Ambulatory Visit: Payer: Medicare HMO

## 2020-02-13 ENCOUNTER — Other Ambulatory Visit: Payer: Self-pay

## 2020-02-13 DIAGNOSIS — M810 Age-related osteoporosis without current pathological fracture: Secondary | ICD-10-CM | POA: Diagnosis not present

## 2020-02-14 ENCOUNTER — Encounter: Payer: Self-pay | Admitting: Internal Medicine

## 2020-02-14 LAB — CALCIUM, URINE, 24 HOUR: Calcium, 24H Urine: 252 mg/24 h — ABNORMAL HIGH

## 2020-02-14 LAB — CREATININE, URINE, 24 HOUR: Creatinine, 24H Ur: 0.84 g/(24.h) (ref 0.50–2.15)

## 2020-02-14 LAB — EXTRA URINE SPECIMEN

## 2020-02-15 ENCOUNTER — Encounter: Payer: Self-pay | Admitting: Internal Medicine

## 2020-04-18 ENCOUNTER — Ambulatory Visit (INDEPENDENT_AMBULATORY_CARE_PROVIDER_SITE_OTHER): Payer: Medicare HMO | Admitting: Internal Medicine

## 2020-04-18 ENCOUNTER — Encounter: Payer: Self-pay | Admitting: Internal Medicine

## 2020-04-18 ENCOUNTER — Other Ambulatory Visit: Payer: Self-pay

## 2020-04-18 VITALS — BP 126/80 | HR 63 | Temp 97.2°F | Resp 16 | Ht 65.0 in | Wt 128.6 lb

## 2020-04-18 DIAGNOSIS — E782 Mixed hyperlipidemia: Secondary | ICD-10-CM

## 2020-04-18 DIAGNOSIS — Z0000 Encounter for general adult medical examination without abnormal findings: Secondary | ICD-10-CM | POA: Diagnosis not present

## 2020-04-18 DIAGNOSIS — E559 Vitamin D deficiency, unspecified: Secondary | ICD-10-CM

## 2020-04-18 DIAGNOSIS — Z136 Encounter for screening for cardiovascular disorders: Secondary | ICD-10-CM

## 2020-04-18 DIAGNOSIS — R7309 Other abnormal glucose: Secondary | ICD-10-CM

## 2020-04-18 DIAGNOSIS — Z8249 Family history of ischemic heart disease and other diseases of the circulatory system: Secondary | ICD-10-CM

## 2020-04-18 DIAGNOSIS — R0989 Other specified symptoms and signs involving the circulatory and respiratory systems: Secondary | ICD-10-CM

## 2020-04-18 DIAGNOSIS — Z0001 Encounter for general adult medical examination with abnormal findings: Secondary | ICD-10-CM

## 2020-04-18 DIAGNOSIS — Z1211 Encounter for screening for malignant neoplasm of colon: Secondary | ICD-10-CM

## 2020-04-18 DIAGNOSIS — G729 Myopathy, unspecified: Secondary | ICD-10-CM

## 2020-04-18 DIAGNOSIS — Z79899 Other long term (current) drug therapy: Secondary | ICD-10-CM

## 2020-04-18 NOTE — Patient Instructions (Signed)
Due to recent changes in healthcare laws, you may see the results of your imaging and laboratory studies on MyChart before your provider has had a chance to review them.  We understand that in some cases there may be results that are confusing or concerning to you. Not all laboratory results come back in the same time frame and the provider may be waiting for multiple results in order to interpret others.  Please give [place] 48 hours in order for your provider to thoroughly review all the results before contacting the office for clarification of your results.   +++++++++++++++++++++++++  Vit D  & Vit C 1,000 mg   are recommended to help protect  against the Covid-19 and other Corona viruses.    Also it's recommended  to take  Zinc 50 mg  to help  protect against the Covid-19   and best place to get  is also on [email or web address]  and don't pay more than 6-8 cents /pill !  ================================ Coronavirus (COVID-19) Are you at risk?  Are you at risk for the Coronavirus (COVID-19)?  To be considered HIGH RISK for Coronavirus (COVID-19), you have to meet the following criteria:   Traveled to [place], [place], [place], [place] or [place]; or in the [place] to [place], [place], [place]   or [place]; and have fever, cough, and shortness of breath within the last 2 weeks of travel OR  Been in close contact with a person diagnosed with COVID-19 within the last 2 weeks and have   fever, cough,and shortness of breath    IF YOU DO NOT MEET THESE CRITERIA, YOU ARE CONSIDERED LOW RISK FOR COVID-19.  What to do if you are HIGH RISK for COVID-19?   If you are having a medical emergency, call 911.  Seek medical care right away. Before you go to a doctors office, urgent care or emergency department,   call ahead and tell them about your recent travel, contact with someone diagnosed with COVID-19    and your symptoms.   You should receive instructions from your physicians  office regarding next steps of care.   When you arrive at healthcare provider, tell the healthcare staff immediately you have returned from   visiting [place], [place], [place], [place] or [place]; or traveled in the [place] to [place], [place],   [place] or [place] in the last two weeks or you have been in close contact with a person diagnosed with   COVID-19 in the last 2 weeks.    Tell the health care staff about your symptoms: fever, cough and shortness of breath.  After you have been seen by a medical provider, you will be either: o Tested for (COVID-19) and discharged home on quarantine except to seek medical care if  o symptoms worsen, and asked to  - Stay home and avoid contact with others until you get your results (4-5 days)  - Avoid travel on public transportation if possible (such as bus, train, or airplane) or o Sent to the Emergency Department by EMS for evaluation, COVID-19 testing  and  o possible admission depending on your condition and test results.  What to do if you are LOW RISK for COVID-19?  Reduce your risk of any infection by using the same precautions used for avoiding the common cold or flu:   Wash your hands often with soap and warm water for at least 20 seconds.  If soap and water are not readily  available,  . use an alcohol-based hand sanitizer with at least 60% alcohol.  . If coughing or sneezing, cover your mouth and nose by coughing or sneezing into the elbow areas of your shirt or coat, .  into a tissue or into your sleeve (not your hands). . Avoid shaking hands with others and consider head nods or verbal greetings only. . Avoid touching your eyes, nose, or mouth with unwashed hands.  . Avoid close contact with people who are sick. . Avoid places or events with large numbers of people in one location, like concerts or sporting events. . Carefully consider travel plans you have or are making. . If you are planning any travel outside or  inside the [place], visit the CDC's Travelers' Health webpage for the latest health notices. . If you have some symptoms but not all symptoms, continue to monitor at home and seek medical attention  . if your symptoms worsen. . If you are having a medical emergency, call 911.   . >>>>>>>>>>>>>>>>>>>>>>>>>>>>>>>>> . We Do NOT Approve of  [facility] [place] Soliciting Our Patients  To Do Home Visits & We Do NOT Approve of [employer] > > > > > > > > > > > > > > > > > > > > > > > > > > > > > > > > > > > > > > >  Preventive Care for Adults  A healthy lifestyle and preventive care can promote health and wellness. Preventive health guidelines for women include the following key practices.  A routine yearly physical is a good way to check with your health care provider about your health and preventive screening. It is a chance to share any concerns and updates on your health and to receive a thorough exam.  Visit your dentist for a routine exam and preventive care every 6 months. Brush your teeth twice a day and floss once a day. Good oral hygiene prevents tooth decay and gum disease.  The frequency of eye exams is based on your age, health, family medical history, use of contact lenses, and other factors. Follow your health care provider's recommendations for frequency of eye exams.  Eat a healthy diet. Foods like vegetables, fruits, whole grains, low-fat dairy products, and lean protein foods contain the nutrients you need without too many calories. Decrease your intake of foods high in solid fats, added sugars, and salt. Eat the right amount of calories for you. Get information about a proper diet from your health care provider, if necessary.  Regular physical exercise is one of the most important things you can do for your health. Most adults should get at least 150 minutes of moderate-intensity exercise (any activity that increases your heart rate and causes you to sweat) each  week. In addition, most adults need muscle-strengthening exercises on 2 or more days a week.  Maintain a healthy weight. The body mass index (BMI) is a screening tool to identify possible weight problems. It provides an estimate of body fat based on height and weight. Your health care provider can find your BMI and can help you achieve or maintain a healthy weight. For adults 20 years and older:  A BMI below 18.5 is considered underweight.  A BMI of 18.5 to 24.9 is normal.  A BMI of 25 to 29.9 is considered overweight.  A BMI of 30 and above is considered obese.  Maintain normal blood lipids and cholesterol levels by exercising and minimizing your intake of   saturated fat. Eat a balanced diet with plenty of fruit and vegetables. If your lipid or cholesterol levels are high, you are over 50, or you are at high risk for heart disease, you may need your cholesterol levels checked more frequently. Ongoing high lipid and cholesterol levels should be treated with medicines if diet and exercise are not working.  If you smoke, find out from your health care provider how to quit. If you do not use tobacco, do not start.  Lung cancer screening is recommended for adults aged 29-80 years who are at high risk for developing lung cancer because of a history of smoking. A yearly low-dose CT scan of the lungs is recommended for people who have at least a 30-pack-year history of smoking and are a current smoker or have quit within the past 15 years. A pack year of smoking is smoking an average of 1 pack of cigarettes a day for 1 year (for example: 1 pack a day for 30 years or 2 packs a day for 15 years). Yearly screening should continue until the smoker has stopped smoking for at least 15 years. Yearly screening should be stopped for people who develop a health problem that would prevent them from having lung cancer treatment.  Avoid use of street drugs. Do not share needles with anyone. Ask for help if you need  support or instructions about stopping the use of drugs.  High blood pressure causes heart disease and increases the risk of stroke.  Ongoing high blood pressure should be treated with medicines if weight loss and exercise do not work.  If you are 71-34 years old, ask your health care provider if you should take aspirin to prevent strokes.  Diabetes screening involves taking a blood sample to check your fasting blood sugar level. This should be done once every 3 years, after age 62, if you are within normal weight and without risk factors for diabetes. Testing should be considered at a younger age or be carried out more frequently if you are overweight and have at least 1 risk factor for diabetes.  Breast cancer screening is essential preventive care for women. You should practice "breast self-awareness." This means understanding the normal appearance and feel of your breasts and may include breast self-examination. Any changes detected, no matter how small, should be reported to a health care provider. Women in their 71s and 30s should have a clinical breast exam (CBE) by a health care provider as part of a regular health exam every 1 to 3 years. After age 98, women should have a CBE every year. Starting at age 47, women should consider having a mammogram (breast X-ray test) every year. Women who have a family history of breast cancer should talk to their health care provider about genetic screening. Women at a high risk of breast cancer should talk to their health care providers about having an MRI and a mammogram every year.  Breast cancer gene (BRCA)-related cancer risk assessment is recommended for women who have family members with BRCA-related cancers. BRCA-related cancers include breast, ovarian, tubal, and peritoneal cancers. Having family members with these cancers may be associated with an increased risk for harmful changes (mutations) in the breast cancer genes BRCA1 and BRCA2. Results of the  assessment will determine the need for genetic counseling and BRCA1 and BRCA2 testing.  Routine pelvic exams to screen for cancer are no longer recommended for nonpregnant women who are considered low risk for cancer of the pelvic organs (ovaries,  uterus, and vagina) and who do not have symptoms. Ask your health care provider if a screening pelvic exam is right for you.  If you have had past treatment for cervical cancer or a condition that could lead to cancer, you need Pap tests and screening for cancer for at least 20 years after your treatment. If Pap tests have been discontinued, your risk factors (such as having a new sexual partner) need to be reassessed to determine if screening should be resumed. Some women have medical problems that increase the chance of getting cervical cancer. In these cases, your health care provider may recommend more frequent screening and Pap tests.    Colorectal cancer can be detected and often prevented. Most routine colorectal cancer screening begins at the age of 22 years and continues through age 20 years. However, your health care provider may recommend screening at an earlier age if you have risk factors for colon cancer. On a yearly basis, your health care provider may provide home test kits to check for hidden blood in the stool. Use of a small camera at the end of a tube, to directly examine the colon (sigmoidoscopy or colonoscopy), can detect the earliest forms of colorectal cancer. Talk to your health care provider about this at age 70, when routine screening begins.  Direct exam of the colon should be repeated every 5-10 years through age 60 years, unless early forms of pre-cancerous polyps or small growths are found.  Osteoporosis is a disease in which the bones lose minerals and strength with aging. This can result in serious bone fractures or breaks. The risk of osteoporosis can be identified using a bone density scan. Women ages 23 years and over and women  at risk for fractures or osteoporosis should discuss screening with their health care providers. Ask your health care provider whether you should take a calcium supplement or vitamin D to reduce the rate of osteoporosis.  Menopause can be associated with physical symptoms and risks. Hormone replacement therapy is available to decrease symptoms and risks. You should talk to your health care provider about whether hormone replacement therapy is right for you.  Use sunscreen. Apply sunscreen liberally and repeatedly throughout the day. You should seek shade when your shadow is shorter than you. Protect yourself by wearing long sleeves, pants, a wide-brimmed hat, and sunglasses year round, whenever you are outdoors.  Once a month, do a whole body skin exam, using a mirror to look at the skin on your back. Tell your health care provider of new moles, moles that have irregular borders, moles that are larger than a pencil eraser, or moles that have changed in shape or color.  Stay current with required vaccines (immunizations).  Influenza vaccine. All adults should be immunized every year.  Tetanus, diphtheria, and acellular pertussis (Td, Tdap) vaccine. Pregnant women should receive 1 dose of Tdap vaccine during each pregnancy. The dose should be obtained regardless of the length of time since the last dose. Immunization is preferred during the 27th-36th week of gestation. An adult who has not previously received Tdap or who does not know her vaccine status should receive 1 dose of Tdap. This initial dose should be followed by tetanus and diphtheria toxoids (Td) booster doses every 10 years. Adults with an unknown or incomplete history of completing a 3-dose immunization series with Td-containing vaccines should begin or complete a primary immunization series including a Tdap dose. Adults should receive a Td booster every 10 years.  Zoster vaccine. One dose is recommended for adults aged 55 years or  older unless certain conditions are present.    Pneumococcal 13-valent conjugate (PCV13) vaccine. When indicated, a person who is uncertain of her immunization history and has no record of immunization should receive the PCV13 vaccine. An adult aged 60 years or older who has certain medical conditions and has not been previously immunized should receive 1 dose of PCV13 vaccine. This PCV13 should be followed with a dose of pneumococcal polysaccharide (PPSV23) vaccine. The PPSV23 vaccine dose should be obtained at least 1 or more year(s) after the dose of PCV13 vaccine. An adult aged 9 years or older who has certain medical conditions and previously received 1 or more doses of PPSV23 vaccine should receive 1 dose of PCV13. The PCV13 vaccine dose should be obtained 1 or more years after the last PPSV23 vaccine dose.    Pneumococcal polysaccharide (PPSV23) vaccine. When PCV13 is also indicated, PCV13 should be obtained first. All adults aged 3 years and older should be immunized. An adult younger than age 43 years who has certain medical conditions should be immunized. Any person who resides in a nursing home or long-term care facility should be immunized. An adult smoker should be immunized. People with an immunocompromised condition and certain other conditions should receive both PCV13 and PPSV23 vaccines. People with human immunodeficiency virus (HIV) infection should be immunized as soon as possible after diagnosis. Immunization during chemotherapy or radiation therapy should be avoided. Routine use of PPSV23 vaccine is not recommended for American Indians, [place] Natives, or people younger than 65 years unless there are medical conditions that require PPSV23 vaccine. When indicated, people who have unknown immunization and have no record of immunization should receive PPSV23 vaccine. One-time revaccination 5 years after the first dose of PPSV23 is recommended for people aged 19-64 years who have chronic  kidney failure, nephrotic syndrome, asplenia, or immunocompromised conditions. People who received 1-2 doses of PPSV23 before age 43 years should receive another dose of PPSV23 vaccine at age 63 years or later if at least 5 years have passed since the previous dose. Doses of PPSV23 are not needed for people immunized with PPSV23 at or after age 43 years.   Preventive Services / Frequency  Ages 56 years and over  Blood pressure check.  Lipid and cholesterol check.  Lung cancer screening. / Every year if you are aged 74-80 years and have a 30-pack-year history of smoking and currently smoke or have quit within the past 15 years. Yearly screening is stopped once you have quit smoking for at least 15 years or develop a health problem that would prevent you from having lung cancer treatment.  Clinical breast exam.** / Every year after age 37 years.   BRCA-related cancer risk assessment.** / For women who have family members with a BRCA-related cancer (breast, ovarian, tubal, or peritoneal cancers).  Mammogram.** / Every year beginning at age 53 years and continuing for as long as you are in good health. Consult with your health care provider.  Pap test.** / Every 3 years starting at age 65 years through age 61 or 7 years with 3 consecutive normal Pap tests. Testing can be stopped between 65 and 70 years with 3 consecutive normal Pap tests and no abnormal Pap or HPV tests in the past 10 years.  Fecal occult blood test (FOBT) of stool. / Every year beginning at age 29 years and continuing until age 18 years. You may not need to  do this test if you get a colonoscopy every 10 years.  Flexible sigmoidoscopy or colonoscopy.** / Every 5 years for a flexible sigmoidoscopy or every 10 years for a colonoscopy beginning at age 50 years and continuing until age 75 years.  Hepatitis C blood test.** / For all people born from 1945 through 1965 and any individual with known risks for hepatitis  C.  Osteoporosis screening.** / A one-time screening for women ages 65 years and over and women at risk for fractures or osteoporosis.  Skin self-exam. / Monthly.  Influenza vaccine. / Every year.  Tetanus, diphtheria, and acellular pertussis (Tdap/Td) vaccine.** / 1 dose of Td every 10 years.  Zoster vaccine.** / 1 dose for adults aged 60 years or older.  Pneumococcal 13-valent conjugate (PCV13) vaccine.** / Consult your health care provider.  Pneumococcal polysaccharide (PPSV23) vaccine.** / 1 dose for all adults aged 65 years and older. Screening for abdominal aortic aneurysm (AAA)  by ultrasound is recommended for people who have history of high blood pressure or who are current or former smokers. ++++++++++++++++++++ Recommend Adult Low Dose Aspirin or  coated  Aspirin 81 mg daily  To reduce risk of Colon Cancer 40 %,  Skin Cancer 26 % ,  Melanoma 46%  and  Pancreatic cancer 60% ++++++++++++++++++++ Vitamin D goal  is between 70-100.  Please make sure that you are taking your Vitamin D as directed.  It is very important as a natural anti-inflammatory  helping hair, skin, and nails, as well as reducing stroke and heart attack risk.  It helps your bones and helps with mood. It also decreases numerous cancer risks so please take it as directed.  Low Vit D is associated with a 200-300% higher risk for CANCER  and 200-300% higher risk for HEART   ATTACK  &  STROKE.   ...................................... It is also associated with higher death rate at younger ages,  autoimmune diseases like Rheumatoid arthritis, Lupus, Multiple Sclerosis.    Also many other serious conditions, like depression, Alzheimer's Dementia, infertility, muscle aches, fatigue, fibromyalgia - just to name a few. ++++++++++++++++++ Recommend the book "The END of DIETING" by Dr Joel Fuhrman  & the book "The END of DIABETES " by Dr Joel Fuhrman At [employer]com - get book & Audio CD's    Being diabetic has  a  300% increased risk for heart attack, stroke, cancer, and alzheimer- type vascular dementia. It is very important that you work harder with diet by avoiding all foods that are white. Avoid white rice (brown & wild rice is OK), white potatoes (sweetpotatoes in moderation is OK), White bread or wheat bread or anything made out of white flour like bagels, donuts, rolls, buns, biscuits, cakes, pastries, cookies, pizza crust, and pasta (made from white flour & egg whites) - vegetarian pasta or spinach or wheat pasta is OK. Multigrain breads like Arnold's or [employer], or multigrain sandwich thins or flatbreads.  Diet, exercise and weight loss can reverse and cure diabetes in the early stages.  Diet, exercise and weight loss is very important in the control and prevention of complications of diabetes which affects every system in your body, ie. Brain - dementia/stroke, eyes - glaucoma/blindness, heart - heart attack/heart failure, kidneys - dialysis, stomach - gastric paralysis, intestines - malabsorption, nerves - severe painful neuritis, circulation - gangrene & loss of a leg(s), and finally cancer and Alzheimers.    I recommend avoid fried & greasy foods,  sweets/candy, white rice (brown or wild   rice or Quinoa is OK), white potatoes (sweet potatoes are OK) - anything made from white flour - bagels, doughnuts, rolls, buns, biscuits,white and wheat breads, pizza crust and traditional pasta made of white flour & egg white(vegetarian pasta or spinach or wheat pasta is OK).  Multi-grain bread is OK - like multi-grain flat bread or sandwich thins. Avoid alcohol in excess. Exercise is also important.    Eat all the vegetables you want - avoid meat, especially red meat and dairy - especially cheese.  Cheese is the most concentrated form of trans-fats which is the worst thing to clog up our arteries. Veggie cheese is OK which can be found in the fresh produce section at [employer] or [employer] or  [employer]  +++++++++++++++++++ DASH Eating Plan  DASH stands for "Dietary Approaches to Stop Hypertension."   The DASH eating plan is a healthy eating plan that has been shown to reduce high blood pressure (hypertension). Additional health benefits may include reducing the risk of type 2 diabetes mellitus, heart disease, and stroke. The DASH eating plan may also help with weight loss. WHAT DO I NEED TO KNOW ABOUT THE DASH EATING PLAN? For the DASH eating plan, you will follow these general guidelines:  Choose foods with a percent daily value for sodium of less than 5% (as listed on the food label).  Use salt-free seasonings or herbs instead of table salt or sea salt.  Check with your health care provider or pharmacist before using salt substitutes.  Eat lower-sodium products, often labeled as "lower sodium" or "no salt added."  Eat fresh foods.  Eat more vegetables, fruits, and low-fat dairy products.  Choose whole grains. Look for the word "whole" as the first word in the ingredient list.  Choose fish   Limit sweets, desserts, sugars, and sugary drinks.  Choose heart-healthy fats.  Eat veggie cheese   Eat more home-cooked food and less restaurant, buffet, and fast food.  Limit fried foods.  Cook foods using methods other than frying.  Limit canned vegetables. If you do use them, rinse them well to decrease the sodium.  When eating at a restaurant, ask that your food be prepared with less salt, or no salt if possible.                      WHAT FOODS CAN I EAT? Read Dr Joel Fuhrman's books on The End of Dieting & The End of Diabetes  Grains Whole grain or whole wheat bread. Brown rice. Whole grain or whole wheat pasta. Quinoa, bulgur, and whole grain cereals. Low-sodium cereals. Corn or whole wheat flour tortillas. Whole grain cornbread. Whole grain crackers. Low-sodium crackers.  Vegetables Fresh or frozen vegetables (raw, steamed, roasted, or grilled). Low-sodium or  reduced-sodium tomato and vegetable juices. Low-sodium or reduced-sodium tomato sauce and paste. Low-sodium or reduced-sodium canned vegetables.   Fruits All fresh, canned (in natural juice), or frozen fruits.  Protein Products  All fish and seafood.  Dried beans, peas, or lentils. Unsalted nuts and seeds. Unsalted canned beans.  Dairy Low-fat dairy products, such as skim or 1% milk, 2% or reduced-fat cheeses, low-fat ricotta or cottage cheese, or plain low-fat yogurt. Low-sodium or reduced-sodium cheeses.  Fats and Oils Tub margarines without trans fats. Light or reduced-fat mayonnaise and salad dressings (reduced sodium). Avocado. Safflower, olive, or canola oils. Natural peanut or almond butter.  Other Unsalted popcorn and pretzels. The items listed above may not be a complete list of recommended foods   or beverages. Contact your dietitian for more options.  +++++++++++++++  WHAT FOODS ARE NOT RECOMMENDED? Grains/ White flour or wheat flour White bread. White pasta. White rice. Refined cornbread. Bagels and croissants. Crackers that contain trans fat.  Vegetables  Creamed or fried vegetables. Vegetables in a . Regular canned vegetables. Regular canned tomato sauce and paste. Regular tomato and vegetable juices.  Fruits Dried fruits. Canned fruit in light or heavy syrup. Fruit juice.  Meat and Other Protein Products Meat in general - RED meat & White meat.  Fatty cuts of meat. Ribs, chicken wings, all processed meats as bacon, sausage, bologna, salami, fatback, hot dogs, bratwurst and packaged luncheon meats.  Dairy Whole or 2% milk, cream, half-and-half, and cream cheese. Whole-fat or sweetened yogurt. Full-fat cheeses or blue cheese. Non-dairy creamers and whipped toppings. Processed cheese, cheese spreads, or cheese curds.  Condiments Onion and garlic salt, seasoned salt, table salt, and sea salt. Canned and packaged gravies. Worcestershire sauce. Tartar sauce. Barbecue  sauce. Teriyaki sauce. Soy sauce, including reduced sodium. Steak sauce. Fish sauce. Oyster sauce. Cocktail sauce. Horseradish. Ketchup and mustard. Meat flavorings and tenderizers. Bouillon cubes. Hot sauce. Tabasco sauce. Marinades. Taco seasonings. Relishes.  Fats and Oils Butter, stick margarine, lard, shortening and bacon fat. Coconut, palm kernel, or palm oils. Regular salad dressings.  Pickles and olives. Salted popcorn and pretzels.  The items listed above may not be a complete list of foods and beverages to avoid.    

## 2020-04-18 NOTE — Progress Notes (Signed)
Annual Screening/Preventative Visit & Comprehensive Evaluation &  Examination      This very nice 79 y.o.  WWF presents for a Screening /Preventative Visit & comprehensive evaluation and management of multiple medical co-morbidities.  Patient has been followed for labile HTN, HLD, Prediabetes  and Vitamin D Deficiency. Patient also  follows with Dr Philemon Kingdom for Osteoporosis.         Patient has been followed expectantly for Labile HTN. Patient's BP has been controlled at home and patient denies any cardiac symptoms as chest pain, palpitations, shortness of breath, dizziness or ankle swelling. Today's BP is at goal - 126/80.       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 Patient denies myalgias or other medication SE's. Last lipids were mnjot at goal:  Lab Results  Component Value Date   CHOL 188 01/12/2020   HDL 69 01/12/2020   LDLCALC 110 (H) 01/12/2020   TRIG 32 01/12/2020   CHOLHDL 2.7 01/12/2020        Patient has hx/o prediabetes (A1c 5.8%/2015) and patient denies reactive hypoglycemic symptoms, visual blurring, diabetic polys or paresthesias. Last A1c was at goal:   Lab Results  Component Value Date   HGBA1C 5.5 03/21/2019        Finally, patient has history of Vitamin D Deficiency ("34"/2010) and last Vitamin D was at goal:  Lab Results  Component Value Date   VD25OH 65.1 01/31/2020    Current Outpatient Medications on File Prior to Visit  Medication Sig  . aspirin EC 81 MG tablet Take daily  . CALCIUM + D  Take 1 tablet  daily.  Marland Kitchen VITAMIN D 5000 UNITS TABS Take 2 capsules (10,000 units) on TThSat, take 1 capsule (5,000 units) on SunMWF  . ezetimibe  10 MG tablet Take 1 tablet Daily for Cholesterol  . gemfibrozil  600 MG tablet Take 1 tablet 2 x /day with a Meal   . halobetasol  0.05 % oint Apply topically 2  times daily.  Marland Kitchen MAGNESIUM  Take 1 tablet  daily.   . Multi-Vit   w/Min Take 1 tablet daily.    Allergies  Allergen Reactions  . Atorvastatin Other (See Comments)    myalgia  . Fosamax [Alendronate Sodium] Other (See Comments)    Myalgia    Past Medical History:  Diagnosis Date  . History of ITP   . Hyperlipidemia   . IBS (irritable bowel syndrome)   . Osteopenia   . Psoriasis    Health Maintenance  Topic Date Due  . INFLUENZA VACCINE  11/20/2019  . COVID-19 Vaccine (3 - Booster for Pfizer series) 11/29/2019  . TETANUS/TDAP  12/01/2028  . DEXA SCAN  Completed  . PNA vac Low Risk Adult  Completed   Immunization History  Administered Date(s) Administered  . Influenza Split 01/11/2019  . Influenza, High Dose Seasonal PF 01/25/2014, 01/22/2017  . Influenza,inj,quad, With Preservative 01/19/2017  . Influenza-Unspecified 12/21/2014, 01/22/2016, 01/18/2018  . PFIZER SARS-COV-2 Vaccination 05/11/2019, 06/01/2019  . PPD Test 03/14/2013  . Pneumococcal Conjugate-13 12/05/2015  . Pneumococcal Polysaccharide-23 02/08/2009, 06/30/2014  . Td 02/08/2009  . Tdap 12/02/2018  . Zoster 03/29/2009   Last Colon - Patient refuses   Cologard - 07/31/ Negative - Recc 3 yr f/u due Aug 2022  Last MGM - 12/28/2019  Past Surgical History:  Procedure Laterality Date  . ABDOMINAL HYSTERECTOMY    . APPENDECTOMY    . BREAST SURGERY  Right    Biospy, benign  . EYE SURGERY Bilateral    Cataracts  . EYE SURGERY     Lense implants  . TUBAL LIGATION     Family History  Problem Relation Age of Onset  . AAA (abdominal aortic aneurysm) Mother   . Hypertension Mother   . Hypertension Father   . Heart disease Father   . Hyperlipidemia Father   . Cancer Maternal Aunt        uterine   Social History   Tobacco Use  . Smoking status: Never Smoker  . Smokeless tobacco: Never Used  Substance Use Topics  . Alcohol use: Yes    Comment: occasional wine 1-2 drinks  . Drug use: No    ROS Constitutional: Denies fever, chills, weight loss/gain, headaches,  insomnia,  night sweats, and change in appetite. Does c/o fatigue. Eyes: Denies redness, blurred vision, diplopia, discharge, itchy, watery eyes.  ENT: Denies discharge, congestion, post nasal drip, epistaxis, sore throat, earache, hearing loss, dental pain, Tinnitus, Vertigo, Sinus pain, snoring.  Cardio: Denies chest pain, palpitations, irregular heartbeat, syncope, dyspnea, diaphoresis, orthopnea, PND, claudication, edema Respiratory: denies cough, dyspnea, DOE, pleurisy, hoarseness, laryngitis, wheezing.  Gastrointestinal: Denies dysphagia, heartburn, reflux, water brash, pain, cramps, nausea, vomiting, bloating, diarrhea, constipation, hematemesis, melena, hematochezia, jaundice, hemorrhoids Genitourinary: Denies dysuria, frequency, urgency, nocturia, hesitancy, discharge, hematuria, flank pain Breast: Breast lumps, nipple discharge, bleeding.  Musculoskeletal: Denies arthralgia, myalgia, stiffness, Jt. Swelling, pain, limp, and strain/sprain. Denies falls. Skin: Denies puritis, rash, hives, warts, acne, eczema, changing in skin lesion Neuro: No weakness, tremor, incoordination, spasms, paresthesia, pain Psychiatric: Denies confusion, memory loss, sensory loss. Denies Depression. Endocrine: Denies change in weight, skin, hair change, nocturia, and paresthesia, diabetic polys, visual blurring, hyper / hypo glycemic episodes.  Heme/Lymph: No excessive bleeding, bruising, enlarged lymph nodes.  Physical Exam  BP 126/80   Pulse 63   Temp (!) 97.2 F (36.2 C)   Resp 16   Ht 5\' 5"  (1.651 m)   Wt 128 lb 9.6 oz (58.3 kg)   SpO2 97%   BMI 21.40 kg/m   General Appearance: Well nourished, well groomed and in no apparent distress.  Eyes: PERRLA, EOMs, conjunctiva no swelling or erythema, normal fundi and vessels. Sinuses: No frontal/maxillary tenderness ENT/Mouth: EACs patent / TMs  nl. Nares clear without erythema, swelling, mucoid exudates. Oral hygiene is good. No erythema, swelling, or  exudate. Tongue normal, non-obstructing. Tonsils not swollen or erythematous. Hearing normal.  Neck: Supple, thyroid not palpable. No bruits, nodes or JVD. Respiratory: Respiratory effort normal.  BS equal and clear bilateral without rales, rhonci, wheezing or stridor. Cardio: Heart sounds are normal with regular rate and rhythm and no murmurs, rubs or gallops. Peripheral pulses are normal and equal bilaterally without edema. No aortic or femoral bruits. Chest: symmetric with normal excursions and percussion. Breasts: Symmetric, without lumps, nipple discharge, retractions, or fibrocystic changes.  Abdomen: Flat, soft with bowel sounds active. Nontender, no guarding, rebound, hernias, masses, or organomegaly.  Lymphatics: Non tender without lymphadenopathy.  Genitourinary:  Musculoskeletal: Full ROM all peripheral extremities, joint stability, 5/5 strength, and normal gait. Skin: Warm and dry without rashes, lesions, cyanosis, clubbing or  ecchymosis.  Neuro: Cranial nerves intact, reflexes equal bilaterally. Normal muscle tone, no cerebellar symptoms. Sensation intact.  Pysch: Alert and oriented X 3, normal affect, Insight and Judgment appropriate.   Assessment and Plan  1. Annual Preventative Screening Examination   2. Labile hypertension  - EKG 12-Lead - Urinalysis, Routine w  reflex microscopic - Microalbumin / creatinine urine ratio - CBC with Differential/Platelet - COMPLETE METABOLIC PANEL WITH GFR - Magnesium - TSH  3. Hyperlipidemia, mixed  - EKG 12-Lead - Lipid panel  4. Abnormal glucose  - EKG 12-Lead - Hemoglobin A1c - Insulin, random  5. Vitamin D deficiency  - VITAMIN D 25 Hydroxy   6. Screening for colorectal cancer  - POC Hemoccult Bld/Stl   7. Screening for ischemic heart disease  - EKG 12-Lead  8. FHx: heart disease  - EKG 12-Lead  9. 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 Discussed med's effects and SE's. Screening labs and tests as requested with regular follow-up as recommended. Over 40 minutes of exam, counseling, chart review and high complex critical decision making was performed.   Marinus Maw, MD

## 2020-04-19 ENCOUNTER — Encounter: Payer: Self-pay | Admitting: Internal Medicine

## 2020-04-19 DIAGNOSIS — T466X5A Adverse effect of antihyperlipidemic and antiarteriosclerotic drugs, initial encounter: Secondary | ICD-10-CM | POA: Insufficient documentation

## 2020-04-19 DIAGNOSIS — G729 Myopathy, unspecified: Secondary | ICD-10-CM | POA: Insufficient documentation

## 2020-04-19 DIAGNOSIS — Z888 Allergy status to other drugs, medicaments and biological substances status: Secondary | ICD-10-CM | POA: Insufficient documentation

## 2020-04-19 LAB — COMPLETE METABOLIC PANEL WITH GFR
AG Ratio: 1.7 (calc) (ref 1.0–2.5)
ALT: 20 U/L (ref 6–29)
AST: 24 U/L (ref 10–35)
Albumin: 4.4 g/dL (ref 3.6–5.1)
Alkaline phosphatase (APISO): 86 U/L (ref 37–153)
BUN: 24 mg/dL (ref 7–25)
CO2: 30 mmol/L (ref 20–32)
Calcium: 9.8 mg/dL (ref 8.6–10.4)
Chloride: 104 mmol/L (ref 98–110)
Creat: 0.61 mg/dL (ref 0.60–0.88)
GFR, Est African American: 99 mL/min/{1.73_m2} (ref >=60)
GFR, Est Non African American: 86 mL/min/{1.73_m2} (ref >=60)
Globulin: 2.6 g/dL (calc) (ref 1.9–3.7)
Glucose, Bld: 78 mg/dL (ref 65–99)
Potassium: 4.3 mmol/L (ref 3.5–5.3)
Sodium: 142 mmol/L (ref 135–146)
Total Bilirubin: 0.9 mg/dL (ref 0.2–1.2)
Total Protein: 7 g/dL (ref 6.1–8.1)

## 2020-04-19 LAB — MICROALBUMIN / CREATININE URINE RATIO
Creatinine, Urine: 195 mg/dL (ref 20–275)
Microalb Creat Ratio: 12 mcg/mg creat (ref <30)
Microalb, Ur: 2.4 mg/dL

## 2020-04-19 LAB — LIPID PANEL
Cholesterol: 195 mg/dL (ref <200)
HDL: 57 mg/dL (ref >=50)
LDL Cholesterol (Calc): 127 mg/dL (calc) — ABNORMAL HIGH
Non-HDL Cholesterol (Calc): 138 mg/dL (calc) — ABNORMAL HIGH (ref <130)
Total CHOL/HDL Ratio: 3.4 (calc) (ref <5.0)
Triglycerides: 35 mg/dL (ref <150)

## 2020-04-19 LAB — CBC WITH DIFFERENTIAL/PLATELET
Absolute Monocytes: 522 cells/uL (ref 200–950)
Basophils Absolute: 60 cells/uL (ref 0–200)
Basophils Relative: 1 %
Eosinophils Absolute: 120 cells/uL (ref 15–500)
Eosinophils Relative: 2 %
HCT: 38 % (ref 35.0–45.0)
Hemoglobin: 13 g/dL (ref 11.7–15.5)
Lymphs Abs: 1596 cells/uL (ref 850–3900)
MCH: 31.6 pg (ref 27.0–33.0)
MCHC: 34.2 g/dL (ref 32.0–36.0)
MCV: 92.5 fL (ref 80.0–100.0)
MPV: 10.7 fL (ref 7.5–12.5)
Monocytes Relative: 8.7 %
Neutro Abs: 3702 cells/uL (ref 1500–7800)
Neutrophils Relative %: 61.7 %
Platelets: 222 10*3/uL (ref 140–400)
RBC: 4.11 10*6/uL (ref 3.80–5.10)
RDW: 12.2 % (ref 11.0–15.0)
Total Lymphocyte: 26.6 %
WBC: 6 10*3/uL (ref 3.8–10.8)

## 2020-04-19 LAB — URINALYSIS, ROUTINE W REFLEX MICROSCOPIC
Bacteria, UA: NONE SEEN /HPF
Bilirubin Urine: NEGATIVE
Glucose, UA: NEGATIVE
Hgb urine dipstick: NEGATIVE
Nitrite: NEGATIVE
Protein, ur: NEGATIVE
Specific Gravity, Urine: 1.024 (ref 1.001–1.03)
pH: <=5 (ref 5.0–8.0)

## 2020-04-19 LAB — VITAMIN D 25 HYDROXY (VIT D DEFICIENCY, FRACTURES): Vit D, 25-Hydroxy: 77 ng/mL (ref 30–100)

## 2020-04-19 LAB — HEMOGLOBIN A1C
Hgb A1c MFr Bld: 5.6 % of total Hgb (ref <5.7)
Mean Plasma Glucose: 114 mg/dL
eAG (mmol/L): 6.3 mmol/L

## 2020-04-19 LAB — INSULIN, RANDOM: Insulin: <1 u[IU]/mL

## 2020-04-19 LAB — MAGNESIUM: Magnesium: 2 mg/dL (ref 1.5–2.5)

## 2020-04-19 LAB — TSH: TSH: 0.94 mIU/L (ref 0.40–4.50)

## 2020-04-19 NOTE — Progress Notes (Signed)
========================================================== -   Test results slightly outside the reference range are not unusual. If there is anything important, I will review this with you,  otherwise it is considered normal test values.  If you have further questions,  please do not hesitate to contact me at the office or via My Chart.  ==========================================================  -  Total Chol = 195 - Elevated  (Ideal or Goal is less than 180)    - and   - LDL Chol = 127 - Very elevated  (Ideal or goal is less than 70   !  !  )   - So. . . . . .  - Cholesterol is too high - Recommend low cholesterol diet   - Cholesterol only comes from animal sources  - ie. meat, dairy, egg yolks  - Eat all the vegetables you want.  - Avoid meat, especially red meat - Beef AND Pork .  - Avoid cheese & dairy - milk & ice cream.     - Cheese is the most concentrated form of trans-fats which  is the worst thing to clog up our arteries.   - Veggie cheese is OK which can be found in the fresh  produce section at [facility] or [employer] or [employer]  - So .......... - Recommend  a stricter low cholesterol diet   - Cholesterol only comes from animal sources  - ie. meat, dairy, egg yolks  - Eat all the vegetables you want.  - Avoid meat, especially red meat - Beef AND Pork .  - Avoid cheese & dairy - milk & ice cream.     - Cheese is the most concentrated form of trans-fats which  is the worst thing to clog up our arteries.   - Veggie cheese is OK which can be found in the fresh  produce section at [employer] or [employer] or [employer] ==========================================================  -  A1c - Normal - Great - No  Diabetes ! ==========================================================  -  Vitamin D = 77 - Excellent  ==========================================================  -  All Else - CBC - Kidneys - Electrolytes - Liver - Magnesium & Thyroid     - all  Normal / OK ===========================================================

## 2020-05-03 ENCOUNTER — Ambulatory Visit: Payer: Medicare HMO | Admitting: Internal Medicine

## 2020-06-17 ENCOUNTER — Other Ambulatory Visit: Payer: Self-pay | Admitting: Internal Medicine

## 2020-06-17 DIAGNOSIS — E782 Mixed hyperlipidemia: Secondary | ICD-10-CM

## 2020-06-17 MED ORDER — EZETIMIBE 10 MG PO TABS
ORAL_TABLET | ORAL | 0 refills | Status: DC
Start: 2020-06-17 — End: 2020-10-30

## 2020-07-26 ENCOUNTER — Encounter: Payer: Self-pay | Admitting: Adult Health Nurse Practitioner

## 2020-07-26 ENCOUNTER — Other Ambulatory Visit: Payer: Self-pay

## 2020-07-26 ENCOUNTER — Ambulatory Visit (INDEPENDENT_AMBULATORY_CARE_PROVIDER_SITE_OTHER): Payer: Medicare HMO | Admitting: Adult Health Nurse Practitioner

## 2020-07-26 VITALS — BP 140/80 | HR 56 | Temp 96.8°F | Ht 65.0 in | Wt 136.0 lb

## 2020-07-26 DIAGNOSIS — E782 Mixed hyperlipidemia: Secondary | ICD-10-CM

## 2020-07-26 DIAGNOSIS — M791 Myalgia, unspecified site: Secondary | ICD-10-CM | POA: Diagnosis not present

## 2020-07-26 DIAGNOSIS — R7309 Other abnormal glucose: Secondary | ICD-10-CM

## 2020-07-26 DIAGNOSIS — E559 Vitamin D deficiency, unspecified: Secondary | ICD-10-CM

## 2020-07-26 DIAGNOSIS — H6121 Impacted cerumen, right ear: Secondary | ICD-10-CM

## 2020-07-26 DIAGNOSIS — Z6823 Body mass index (BMI) 23.0-23.9, adult: Secondary | ICD-10-CM

## 2020-07-26 DIAGNOSIS — M810 Age-related osteoporosis without current pathological fracture: Secondary | ICD-10-CM | POA: Diagnosis not present

## 2020-07-26 DIAGNOSIS — Z79899 Other long term (current) drug therapy: Secondary | ICD-10-CM | POA: Diagnosis not present

## 2020-07-26 DIAGNOSIS — R0989 Other specified symptoms and signs involving the circulatory and respiratory systems: Secondary | ICD-10-CM | POA: Diagnosis not present

## 2020-07-26 DIAGNOSIS — H9201 Otalgia, right ear: Secondary | ICD-10-CM | POA: Diagnosis not present

## 2020-07-26 NOTE — Progress Notes (Signed)
FOLLOW UP  Assessment:    Madison Gonzalez was seen today for follow-up.  Diagnoses and all orders for this visit:  Labile hypertension No medications Monitor blood pressure at home; call if consistently over 130/80 Continue DASH diet.   Reminder to go to the ER if any CP, SOB, nausea, dizziness, severe HA, changes vision/speech, left arm numbness and tingling and jaw pain. -     CBC with Differential/Platelet -     COMPLETE METABOLIC PANEL WITH GFR  Hyperlipidemia, mixed Continue medications:zeteia 10mg  & Lopid 600mg  Discussed dietary and exercise modifications Low fat diet -     Lipid panel  Age-related osteoporosis without current pathological fracture Dr Gardenia Phlegm   Vitamin D deficiency Continue supplementation to maintain goal of 70-100 Taking Vitamin D 5,000 IU daily Defer vitamin D level  Abnormal glucose Discussed dietary and exercise modifications  Medication management -     CBC with Differential/Platelet -     COMPLETE METABOLIC PANEL WITH GFR  Body mass index (BMI) of 23.0-23.9 in adult Discussed dietary and exercise modifications  Myalgia -     Sedimentation rate -     C-reactive protein  Otalgia of right ear Impacted cerumen of right ear -     Ear Lavage Tolerated well, canal clear Ear hygiene discussed    Further disposition pending results if labs check today. Discussed med's effects and SE's.   Over 30 minutes of face to face interview, exam, counseling, chart review, and critical decision making was performed.    Future Appointments  Date Time Provider [place]  10/31/2020 10:30 AM Liane Comber, NP [facility] None  05/06/2021  3:00 PM Unk Pinto, MD [facility] None     Subjective:   Madison Gonzalez is a 80 y.o. female who presents for Medicare Annual Wellness Visit and 3 month follow up on hypertension, glucose management, hyperlipidemia, vitamin D def.   She reports that she has been having bad muscles aches in  bilateral arms.  She has noticed this over the past two weeks and increasing.  She reports it is intermittent in nature.  She notices this during the day (1/10) but if she is active it does not bother her.  Reports it does wake her up at night (6/10).  She reports her brother and mother also had similar symptoms and diagnosis with polymyalgias.   Discussed supplementation with del-Immune supplements and Infla-650.  Reports her brother has used these in the past and had positive results. Informed patient we do have have clinical experience to comment on these but recommendations to check ingredients against current medications.  Discussed acetaminophen 1,000mg  with dinner or ibuprofen  She reports some oltalgia to right ear, that started a couple weeks ago, it is intermittent.   [place], just resealed her driveway, stays very active on her land.   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 Mayer Camel who recommended monitoring, if progressive/severe pain would have hip replacement. She had a recent DEXA that showed severe osteoporosis, referred to Dr. Cruzita Lederer for management. She did not tolerate fosamax.  BMI is Body mass index is 22.63 kg/m., she has been working on diet and exercise, very active around her yard, lives on [place] acres which she cares for by herself. Does ride stationary bike occasionally.  Wt Readings from Last 3 Encounters:  07/26/20 136 lb (61.7 kg)  04/18/20 128 lb 9.6 oz (58.3  kg)  01/31/20 127 lb 3.2 oz (57.7 kg)   Her blood pressure has been controlled at home, today their BP is BP: 140/80 She does not workout. She denies chest pain, shortness of breath, dizziness.   She is on cholesterol medication (zetia, lopid) and denies myalgias. Her cholesterol is not at goal. The cholesterol last visit was:   Lab Results  Component Value Date   CHOL 195 04/18/2020   HDL 57 04/18/2020    LDLCALC 127 (H) 04/18/2020   TRIG 35 04/18/2020   CHOLHDL 3.4 04/18/2020   She has been working on diet and exercise for glucose management, and denies foot ulcerations, hyperglycemia, hypoglycemia , increased appetite, nausea, paresthesia of the feet, polydipsia, polyuria, visual disturbances, vomiting and weight loss. Last A1C in the office was:  Lab Results  Component Value Date   HGBA1C 5.6 04/18/2020   Last GFR Lab Results  Component Value Date   GFRNONAA 86 04/18/2020   Patient is on Vitamin D supplement. Lab Results  Component Value Date   VD25OH 77 04/18/2020      Medication Review   Current Outpatient Medications (Cardiovascular):  .  ezetimibe (ZETIA) 10 MG tablet, Take 1 tablet  Daily for Cholesterol       TAKE 1 TABLET BY MOUTH ONCE DAILY FOR CHOLESTEROL .  gemfibrozil (LOPID) 600 MG tablet, Take  1 tablet  2 x /day  with Meals  for Cholesterol        TAKE 1 TABLET BY MOUTH TWICE DAILY WITH A MEAL FOR CHOLESTEROL   Current Outpatient Medications (Analgesics):  .  aspirin EC 81 MG tablet, Take 81 mg by mouth daily. Swallow whole.   Current Outpatient Medications (Other):  Marland Kitchen  Calcium Carbonate-Vitamin D (CALCIUM + D PO), Take 1 tablet by mouth daily. .  Cholecalciferol (VITAMIN D3) 5000 UNITS TABS, Take 5,000-10,000 Units by mouth daily. Take 2 capsules (10,000 units) on Tuesday, Thursday, Saturday, take 1 capsule (5,000 units) on Sunday, Monday, Wednesday, Friday .  halobetasol (ULTRAVATE) 0.05 % ointment, Apply topically 2 (two) times daily. Marland Kitchen  MAGNESIUM PO, Take 1 tablet by mouth daily.  .  Multiple Vitamin (MULTIVITAMIN WITH MINERALS) TABS tablet, Take 1 tablet by mouth daily.  Current Problems (verified) Patient Active Problem List   Diagnosis Date Noted  . Allergy to statin medication 04/19/2020  . 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 Aseptic necrosis of bone of hip, left ([address]) 10/03/2019  . FHx: heart disease 02/23/2018  . Body mass index (BMI)  of 23.0-23.9 in adult 03/31/2015  . Abnormal glucose 03/29/2015  . Elevated BP w/o Dx HTN 06/29/2013  . Hyperlipidemia, mixed 06/29/2013  . Vitamin D deficiency 06/29/2013  . IBS (irritable bowel syndrome)   . Psoriasis   . Osteoporosis   . History of ITP     Objective:   Today's Vitals   07/26/20 0917  BP: 140/80  Pulse: (!) 56  Temp: (!) 96.8 F (36 C)  SpO2: 98%  Weight: 136 lb (61.7 kg)  Height: 5\' 5"  (1.651 m)   Body mass index is 22.63 kg/m.  General appearance: alert, no distress, WD/WN,  female HEENT: normocephalic, sclerae anicteric, TMs pearly, Cerumen noted to right canal, nares patent, no discharge or erythema, pharynx normal Oral cavity: MMM, no lesions Neck: supple, no lymphadenopathy, no thyromegaly, no masses Heart: RRR, normal S1, S2, no murmurs Lungs: CTA bilaterally, no wheezes, rhonchi, or rales Abdomen: +bs, soft, non tender, non distended, no masses, no hepatomegaly, no splenomegaly  Musculoskeletal: nontender, no swelling, no obvious deformity Extremities: no edema, no cyanosis, no clubbing Pulses: 2+ symmetric, upper and lower extremities, normal cap refill Neurological: alert, oriented x 3, CN2-12 intact, strength normal upper extremities and lower extremities, sensation normal throughout, DTRs 2+ throughout, no cerebellar signs, gait normal Psychiatric: normal affect, behavior normal, pleasant     Madison Gonzalez, Laqueta Jean, DNP [facility] 07/26/2020  10:01 AM

## 2020-07-26 NOTE — Patient Instructions (Addendum)
   Ear care: Both of your ears are clear of wax at this time Use 2-3 drops of oil for next 3-4 nights.   You can use olive oil or mineral oil This will help to sooth your ear canals.  Use cotton ball to keep in ears while sleeping.  For regular maintenance: Use warm or room temp peroxide in ear once every 1-2 weeks, then apply oil that night x1.   Next morning let warm water from shower run into your ears.   This will help flush out any wax Do not use cotton swabs in your ears Should you have pain, decrease in your ability to hear or dizziness please contact the office for an appointment.   Monitor your muscle aches and let us know if you are not improving.  You can try acetaminophen 1,000mg  (helps with pain)  OR ibuprofen 600mg  (helps with inflammation).  Be sure to take these with food to avoid stomach upset.   GENERAL HEALTH GOALS  Know what a healthy weight is for you (roughly BMI <25) and aim to maintain this  Aim for 7+ servings of fruits and vegetables daily  70-80+ fluid ounces of water or unsweet tea for healthy kidneys  Limit to max 1 drink of alcohol per day; avoid smoking/tobacco  Limit animal fats in diet for cholesterol and heart health - choose grass fed whenever available  Avoid highly processed foods, and foods high in saturated/trans fats  Aim for low stress - take time to unwind and care for your mental health  Aim for 150 min of moderate intensity exercise weekly for heart health, and weights twice weekly for bone health  Aim for 7-9 hours of sleep daily

## 2020-07-27 LAB — CBC WITH DIFFERENTIAL/PLATELET
Absolute Monocytes: 398 cells/uL (ref 200–950)
Basophils Absolute: 48 cells/uL (ref 0–200)
Basophils Relative: 1 %
Eosinophils Absolute: 139 cells/uL (ref 15–500)
Eosinophils Relative: 2.9 %
HCT: 35.6 % (ref 35.0–45.0)
Hemoglobin: 11.8 g/dL (ref 11.7–15.5)
Lymphs Abs: 1354 cells/uL (ref 850–3900)
MCH: 30.8 pg (ref 27.0–33.0)
MCHC: 33.1 g/dL (ref 32.0–36.0)
MCV: 93 fL (ref 80.0–100.0)
MPV: 11.3 fL (ref 7.5–12.5)
Monocytes Relative: 8.3 %
Neutro Abs: 2861 cells/uL (ref 1500–7800)
Neutrophils Relative %: 59.6 %
Platelets: 211 10*3/uL (ref 140–400)
RBC: 3.83 10*6/uL (ref 3.80–5.10)
RDW: 13 % (ref 11.0–15.0)
Total Lymphocyte: 28.2 %
WBC: 4.8 10*3/uL (ref 3.8–10.8)

## 2020-07-27 LAB — LIPID PANEL
Cholesterol: 158 mg/dL (ref <200)
HDL: 58 mg/dL (ref >=50)
LDL Cholesterol (Calc): 86 mg/dL (calc)
Non-HDL Cholesterol (Calc): 100 mg/dL (calc) (ref <130)
Total CHOL/HDL Ratio: 2.7 (calc) (ref <5.0)
Triglycerides: 48 mg/dL (ref <150)

## 2020-07-27 LAB — COMPLETE METABOLIC PANEL WITH GFR
AG Ratio: 1.8 (calc) (ref 1.0–2.5)
ALT: 12 U/L (ref 6–29)
AST: 17 U/L (ref 10–35)
Albumin: 4.2 g/dL (ref 3.6–5.1)
Alkaline phosphatase (APISO): 77 U/L (ref 37–153)
BUN/Creatinine Ratio: 26 (calc) — ABNORMAL HIGH (ref 6–22)
BUN: 15 mg/dL (ref 7–25)
CO2: 28 mmol/L (ref 20–32)
Calcium: 9.4 mg/dL (ref 8.6–10.4)
Chloride: 107 mmol/L (ref 98–110)
Creat: 0.58 mg/dL — ABNORMAL LOW (ref 0.60–0.88)
GFR, Est African American: 101 mL/min/{1.73_m2} (ref >=60)
GFR, Est Non African American: 87 mL/min/{1.73_m2} (ref >=60)
Globulin: 2.4 g/dL (calc) (ref 1.9–3.7)
Glucose, Bld: 90 mg/dL (ref 65–99)
Potassium: 4.3 mmol/L (ref 3.5–5.3)
Sodium: 143 mmol/L (ref 135–146)
Total Bilirubin: 0.6 mg/dL (ref 0.2–1.2)
Total Protein: 6.6 g/dL (ref 6.1–8.1)

## 2020-07-27 LAB — SEDIMENTATION RATE: Sed Rate: 2 mm/h (ref 0–30)

## 2020-07-27 LAB — C-REACTIVE PROTEIN: CRP: 0.4 mg/L (ref <8.0)

## 2020-09-05 DIAGNOSIS — H35373 Puckering of macula, bilateral: Secondary | ICD-10-CM | POA: Diagnosis not present

## 2020-09-05 DIAGNOSIS — H04123 Dry eye syndrome of bilateral lacrimal glands: Secondary | ICD-10-CM | POA: Diagnosis not present

## 2020-09-05 DIAGNOSIS — H26492 Other secondary cataract, left eye: Secondary | ICD-10-CM | POA: Diagnosis not present

## 2020-09-05 DIAGNOSIS — H524 Presbyopia: Secondary | ICD-10-CM | POA: Diagnosis not present

## 2020-10-04 ENCOUNTER — Ambulatory Visit: Payer: Medicare HMO | Admitting: Adult Health

## 2020-10-30 ENCOUNTER — Other Ambulatory Visit: Payer: Self-pay | Admitting: Internal Medicine

## 2020-10-30 DIAGNOSIS — E782 Mixed hyperlipidemia: Secondary | ICD-10-CM

## 2020-10-30 NOTE — Progress Notes (Signed)
Patient ID: Madison Gonzalez, female   DOB: [birth year], 80 y.o.   MRN: [id number]  MEDICARE ANNUAL WELLNESS VISIT AND FOLLOW UP  Assessment:    Encounter for Medicare annual wellness exam Due annually  Health maintenance reviewed  Irritable bowel syndrome, unspecified type Controlled at this time without flare, takes imodium PRN   Psoriasis -  better at this time, topical PRN  Vitamin D deficiency At goal at recent check; continue to recommend supplementation for goal of 70-100  Abnormal glucose Recent A1Cs at goal Discussed diet/exercise, weight management  Defer A1C; check CMP  Hyperlipidemia Continue medications Continue low cholesterol diet and exercise.  Check lipid panel.   History of ITP Monitor CBC  Elevated BP w/o Dx HTN Monitor blood pressure at home; call if consistently over 130/80 Continue DASH diet.   Reminder to go to the ER if any CP, SOB, nausea, dizziness, severe HA, changes vision/speech, left arm numbness and tingling and jaw pain.  Body mass index (BMI) of 22 Continue to recommend diet heavy in fruits and veggies and low in animal meats, cheeses, and dairy products, appropriate calorie intake Discuss exercise recommendations routinely Continue to monitor weight at each visit   Osteopenia, unspecified location Weight bearing exercises, high calcium diet, supplement vitamin D Repeat DEXA with MGM in 2023, reviewed lifestyle in depth, importance of preventing falls and lifestyle changes to improve or maintain bone density  Fosamax intolerance, Dr. Cruzita Lederer is following, declined meds Long discussion about lifestyle interventions today  Avascular necrosis of hip, left ([place]) Has seen ortho; monitoring only; hip replacement if progressive/limiting pain   Orders Placed This Encounter  Procedures   CBC with Differential/Platelet   COMPLETE METABOLIC PANEL WITH GFR   Magnesium   Lipid panel   TSH   VITAMIN D 25 Hydroxy (Vit-D Deficiency,  Fractures)   Cologuard    Over 30 minutes of exam, counseling, chart review, and critical decision making was performed  Future Appointments  Date Time Provider [place]  05/06/2021  3:00 PM Unk Pinto, MD [facility] None     Plan:   During the course of the visit the patient was educated and counseled about appropriate screening and preventive services including:   Pneumococcal vaccine  Influenza vaccine Td vaccine Prevnar 13 Screening electrocardiogram Screening mammography Bone densitometry screening Colorectal cancer screening Diabetes screening Glaucoma screening Nutrition counseling  Advanced directives: given info/requested copies   Subjective:   Madison Gonzalez is a 80 y.o. female who presents for Medicare Annual Wellness Visit and 3 month follow up on hypertension, glucose management, hyperlipidemia, vitamin D def.   [place], [place], just resealed her driveway, stays very active on her land.   She has L hip avascular necrosis per xray 12/2018 obtained for pelvic pain, saw Dr. Mayer Camel who recommended monitoring, if progressive/severe pain would have hip replacement. She also has osteoporosis with alendronate intolerance and was referred to Dr. Cruzita Lederer for further management, discussed prolia/teriparatide but ultimately declined. She is taking calcium, vitamin D.   Psoriasis - mild, does topical prn. Follows with derm.   BMI is Body mass index is 21.97 kg/m., she has been working on diet and exercise, very active around her yard, lives on [place] acres which she cares for by herself. Does ride stationary bike occasionally.  Wt Readings from Last 3 Encounters:  10/31/20 132 lb (59.9 kg)  07/26/20 136 lb (61.7 kg)  04/18/20 128 lb 9.6 oz (58.3 kg)  Her blood pressure has been controlled at home, today their BP is BP: 118/72 She does not workout but very active. She denies chest pain, shortness of breath,  dizziness.   She is on cholesterol medication (zetia, lopid) and denies myalgias. Hx of fatigue with statins. Her cholesterol is at goal. The cholesterol last visit was:   Lab Results  Component Value Date   CHOL 158 07/26/2020   HDL 58 07/26/2020   LDLCALC 86 07/26/2020   TRIG 48 07/26/2020   CHOLHDL 2.7 07/26/2020   She has been working on diet and exercise for glucose management, and denies foot ulcerations, hyperglycemia, hypoglycemia , increased appetite, nausea, paresthesia of the feet, polydipsia, polyuria, visual disturbances, vomiting and weight loss. Last A1C in the office was:  Lab Results  Component Value Date   HGBA1C 5.6 04/18/2020   Last GFR Lab Results  Component Value Date   GFRNONAA 87 07/26/2020   Patient is on Vitamin D supplement. Lab Results  Component Value Date   VD25OH 77 04/18/2020      Medication Review Current Outpatient Medications on File Prior to Visit  Medication Sig Dispense Refill   aspirin EC 81 MG tablet Take 81 mg by mouth daily. Swallow whole.     Calcium Carbonate-Vitamin D (CALCIUM + D PO) Take 1 tablet by mouth daily.     Cholecalciferol (VITAMIN D3) 5000 UNITS TABS Take 5,000-10,000 Units by mouth daily. Take 2 capsules (10,000 units) on Tuesday, Thursday, Saturday, take 1 capsule (5,000 units) on Sunday, Monday, Wednesday, Friday     ezetimibe (ZETIA) 10 MG tablet TAKE 1 TABLET BY MOUTH ONCE DAILY FOR CHOLESTEROL 90 tablet 3   gemfibrozil (LOPID) 600 MG tablet Take  1 tablet  2 x /day  with Meals  for Cholesterol        TAKE 1 TABLET BY MOUTH TWICE DAILY WITH A MEAL FOR CHOLESTEROL 180 tablet 0   MAGNESIUM PO Take 1 tablet by mouth daily.      Multiple Vitamin (MULTIVITAMIN WITH MINERALS) TABS tablet Take 1 tablet by mouth daily.     halobetasol (ULTRAVATE) 0.05 % ointment Apply topically 2 (two) times daily. (Patient not taking: Reported on 10/31/2020) 50 g 1   No current facility-administered medications on file prior to visit.     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   Aseptic necrosis of bone of hip, left ([place]) 10/03/2019   FHx: heart disease 02/23/2018   BMI 22.0-22.9, adult 03/31/2015   Abnormal glucose 03/29/2015   Elevated BP w/o Dx HTN 06/29/2013   Hyperlipidemia, mixed 06/29/2013   Vitamin D deficiency 06/29/2013   IBS (irritable bowel syndrome)    Psoriasis    Osteoporosis    History of ITP     Screening Tests Immunization History  Administered Date(s) Administered   Fluad Quad(high Dose 65+) 02/15/2020   Influenza Split 01/11/2019   Influenza, High Dose Seasonal PF 01/25/2014, 01/22/2017   Influenza,inj,quad, With Preservative 01/19/2017   Influenza-Unspecified 12/21/2014, 01/22/2016, 01/18/2018   PFIZER(Purple Top)SARS-COV-2 Vaccination 05/11/2019, 06/01/2019, 02/01/2020   PPD Test 03/14/2013   Pneumococcal Conjugate-13 12/05/2015   Pneumococcal Polysaccharide-23 02/08/2009, 06/30/2014   Td 02/08/2009   Tdap 12/02/2018   Zoster, Live 03/29/2009    Preventative care: Last colonoscopy: refuses  Cologuard: 10/2017 ordered today  Last mammogram:  12/2019 DEXA: 12/2019 osteoporosis, doesn't tolerate fosamax, referred to Dr. Cruzita Lederer, declining meds,  discussed exercise  Prior vaccinations: TD or Tdap: 2020 Influenza:  01/2020  Pneumococcal: 2016 Prevnar13: 2017 Shingles/Zostavax: 2016 - ambivalent, check with insurance  Covid 19: 2/2, 2021, pfizer, + booster  Names of Other Physician/Practitioners you currently use: 1. [facility]- here for primary care 2. Dr. Satira Sark, eye doctor, last visit 2022 3. [place], Dr. Louanne Belton, dentist, last visit 2022, q20m  Patient Care Team: Unk Pinto, MD as PCP - General (Internal Medicine) Shon Hough, MD as Consulting Physician (Ophthalmology) Allyn Kenner, MD  (Dermatology)  Past Surgical History:  Procedure Laterality Date   ABDOMINAL HYSTERECTOMY     APPENDECTOMY     BREAST SURGERY Right    Biospy, benign   EYE SURGERY Bilateral    Cataracts   EYE SURGERY     Lense implants   TUBAL LIGATION     Family History  Problem Relation Age of Onset   AAA (abdominal aortic aneurysm) Mother    Hypertension Mother    Hypertension Father    Heart disease Father    Hyperlipidemia Father    Cancer Maternal Aunt        uterine   Social History   Tobacco Use   Smoking status: Never   Smokeless tobacco: Never  Substance Use Topics   Alcohol use: Yes    Comment: occasional wine 1-2 drinks   Drug use: No    MEDICARE WELLNESS OBJECTIVES: Physical activity: Current Exercise Habits: Home exercise routine, Type of exercise: Other - see comments (heavy yardwork and weed eating), Time (Minutes): 60, Frequency (Times/Week): 4, Weekly Exercise (Minutes/Week): 240, Intensity: Mild, Exercise limited by: None identified Cardiac risk factors: Cardiac Risk Factors include: advanced age (>27men, >8 women);dyslipidemia;hypertension Depression/mood screen:   Depression screen [facility] 2/9 10/31/2020  Decreased Interest 0  Down, Depressed, Hopeless 0  PHQ - 2 Score 0    ADLs:  In your present state of health, do you have any difficulty performing the following activities: 10/31/2020 04/18/2020  Hearing? N N  Vision? N N  Difficulty concentrating or making decisions? N N  Walking or climbing stairs? N N  Dressing or bathing? N N  Doing errands, shopping? N N  Some recent data might be hidden     Cognitive Testing  Alert? Yes  Normal Appearance?Yes  Oriented to person? Yes  Place? Yes   Time? Yes  Recall of three objects?  Yes  Can perform simple calculations? Yes  Displays appropriate judgment?Yes  Can read the correct time from a watch face?Yes  EOL planning: Does Patient Have a Medical Advance Directive?: Yes Type of Advance Directive: Healthcare  Power of Attorney, Living will Does patient want to make changes to medical advance directive?: No - Patient declined Copy of [place] in Chart?: No - copy requested     Objective:   Today's Vitals   10/31/20 1032  BP: 118/72  Pulse: (!) 51  Temp: 97.7 F (36.5 C)  SpO2: 97%  Weight: 132 lb (59.9 kg)    Body mass index is 21.97 kg/m.  General appearance: alert, no distress, WD/WN,  female HEENT: normocephalic, sclerae anicteric, TMs pearly, nares patent, no discharge or erythema, pharynx normal Oral cavity: MMM, no lesions Neck: supple, no lymphadenopathy, no thyromegaly, no masses Heart: RRR, normal S1, S2, no murmurs Lungs: CTA bilaterally, no wheezes, rhonchi, or rales Abdomen: +bs, soft, non tender, non distended, no masses, no hepatomegaly, no splenomegaly Musculoskeletal: nontender, no swelling, no obvious deformity Extremities: no edema,  no cyanosis, no clubbing Pulses: 2+ symmetric, upper and lower extremities, normal cap refill Neurological: alert, oriented x 3, CN2-12 intact, strength normal upper extremities and lower extremities, sensation normal throughout, DTRs 2+ throughout, no cerebellar signs, gait normal Psychiatric: normal affect, behavior normal, pleasant   Medicare Attestation I have personally reviewed: The patient's medical and social history Their use of alcohol, tobacco or illicit drugs Their current medications and supplements The patient's functional ability including ADLs,fall risks, home safety risks, cognitive, and hearing and visual impairment Diet and physical activities Evidence for depression or mood disorders  The patient's weight, height, BMI, and visual acuity have been recorded in the chart.  I have made referrals, counseling, and provided education to the patient based on review of the above and I have provided the patient with a written personalized care plan for preventive services.     Izora Ribas,  NP   10/31/2020

## 2020-10-31 ENCOUNTER — Ambulatory Visit (INDEPENDENT_AMBULATORY_CARE_PROVIDER_SITE_OTHER): Payer: Medicare HMO | Admitting: Adult Health

## 2020-10-31 ENCOUNTER — Other Ambulatory Visit: Payer: Self-pay

## 2020-10-31 ENCOUNTER — Encounter: Payer: Self-pay | Admitting: Adult Health

## 2020-10-31 VITALS — BP 118/72 | HR 51 | Temp 97.7°F | Wt 132.0 lb

## 2020-10-31 DIAGNOSIS — Z0000 Encounter for general adult medical examination without abnormal findings: Secondary | ICD-10-CM

## 2020-10-31 DIAGNOSIS — M810 Age-related osteoporosis without current pathological fracture: Secondary | ICD-10-CM | POA: Diagnosis not present

## 2020-10-31 DIAGNOSIS — R6889 Other general symptoms and signs: Secondary | ICD-10-CM

## 2020-10-31 DIAGNOSIS — R7309 Other abnormal glucose: Secondary | ICD-10-CM | POA: Diagnosis not present

## 2020-10-31 DIAGNOSIS — Z0001 Encounter for general adult medical examination with abnormal findings: Secondary | ICD-10-CM

## 2020-10-31 DIAGNOSIS — Z6822 Body mass index (BMI) 22.0-22.9, adult: Secondary | ICD-10-CM

## 2020-10-31 DIAGNOSIS — G729 Myopathy, unspecified: Secondary | ICD-10-CM

## 2020-10-31 DIAGNOSIS — R030 Elevated blood-pressure reading, without diagnosis of hypertension: Secondary | ICD-10-CM | POA: Diagnosis not present

## 2020-10-31 DIAGNOSIS — M791 Myalgia, unspecified site: Secondary | ICD-10-CM

## 2020-10-31 DIAGNOSIS — Z1211 Encounter for screening for malignant neoplasm of colon: Secondary | ICD-10-CM

## 2020-10-31 DIAGNOSIS — L409 Psoriasis, unspecified: Secondary | ICD-10-CM

## 2020-10-31 DIAGNOSIS — M87052 Idiopathic aseptic necrosis of left femur: Secondary | ICD-10-CM | POA: Diagnosis not present

## 2020-10-31 DIAGNOSIS — K589 Irritable bowel syndrome without diarrhea: Secondary | ICD-10-CM

## 2020-10-31 DIAGNOSIS — E782 Mixed hyperlipidemia: Secondary | ICD-10-CM

## 2020-10-31 DIAGNOSIS — Z862 Personal history of diseases of the blood and blood-forming organs and certain disorders involving the immune mechanism: Secondary | ICD-10-CM

## 2020-10-31 DIAGNOSIS — E559 Vitamin D deficiency, unspecified: Secondary | ICD-10-CM

## 2020-10-31 DIAGNOSIS — Z888 Allergy status to other drugs, medicaments and biological substances status: Secondary | ICD-10-CM

## 2020-10-31 NOTE — Patient Instructions (Addendum)
Suggest calcium supplement no more than 300 mg once or twice daily   Taking with K2 may help reduce risks of calcium building plaque   Weight bearing exercises 20-30 min 3 days a week   Pilates, etc    Eating Plan for Osteoporosis Osteoporosis causes your bones to become weak and brittle. This puts you at greater risk for bone breaks (fractures) from small bumps or falls. Making changes to your diet and increasing your physical activity can help strengthen your bones and improve your overallhealth. Calcium and vitamin D are nutrients that play an important role in bone health. Vitamin D helps your body use calcium and strengthen bones. It is important toget enough calcium and vitamin D as part of your eating plan for osteoporosis. What are tips for following this plan? Reading food labels Try to get at least 1,000 milligrams (mg) of calcium each day. Look for foods that have at least 50 mg of calcium per serving. Talk with your health care provider about taking a calcium supplement if you do not get enough calcium from food. Do not have more than 2,500 mg of calcium each day. This is the upper limit for food and nutritional supplements combined. Too much calcium may cause constipation and prevent you from absorbing other important nutrients. Choose foods that contain vitamin D. Take a daily vitamin supplement that contains 800-1,000 international units (IU) of vitamin D. The amount may be different depending on your age, body weight, and where you live. Talk with your dietitian or health care provider about how much vitamin D is right for you. Avoid foods that have more than 300 mg of sodium per serving. Too much sodium can cause your body to lose calcium. Talk with your dietitian or health care provider about how much sodium you are allowed each day. Shopping Do not buy foods with added salt, including: Salted snacks. Angie Fava. Canned soups. Canned meats. Processed meats, such as bacon  or precooked or cured meat like sausages or meat loaves. Smoked fish. Meal planning Eat balanced meals that contain protein foods, fruits and vegetables, and foods rich in calcium and vitamin D. Eat at least 5 servings of fruits and vegetables each day. Eat 5-6 oz (142-170 g) of lean meat, poultry, fish, eggs, or beans each day. Lifestyle Do not use any products that contain nicotine or tobacco, such as cigarettes, e-cigarettes, and chewing tobacco. If you need help quitting, ask your health care provider. If your health care provider recommends that you lose weight: Work with a dietitian to develop an eating plan that will help you reach your desired weight goal. Exercise for at least 30 minutes a day, 5 or more days a week, or as told by your health care provider. Work with a physical therapist to develop an exercise plan that includes flexibility, balance, and strength exercises. Do not focus only on aerobic exercise. Do not drink alcohol if: Your health care provider tells you not to drink. You are pregnant, may be pregnant, or are planning to become pregnant. If you drink alcohol: Limit how much you use to: 0-1 drink a day for women. 0-2 drinks a day for men. Be aware of how much alcohol is in your drink. In the [place] one drink equals one 12 oz bottle of beer (355 mL), one 5 oz glass of wine (148 mL), or one 1 oz glass of hard liquor (44 mL). What foods should I eat? Foods high in calcium  Yogurt. Yogurt with fruit. Milk.  Evaporated skim milk. Dry milk powder. Calcium-fortified orange juice. Parmesan cheese. Part-skim ricotta cheese. Natural hard cheese. Cream cheese. Cottage cheese. Canned sardines. Canned salmon. Calcium-treated tofu. Calcium-fortified cereal bar. Calcium-fortified cereal. Calcium-fortified graham crackers. Cooked collard greens. Turnip greens. Broccoli. Kale. Almonds. White beans. Corn tortilla.  Foods high in vitamin D Cod liver oil. Fatty fish, such as  tuna, mackerel, and salmon. Milk. Fortified soy milk. Fortified fruit juice. Yogurt. Margarine. Egg yolks. Foods high in protein Beef. Lamb. Pork tenderloin. Chicken breast. Tuna (canned). Fish fillet. Tofu. Cooked soy beans. Soy patty. Beans (canned or cooked). Cottage cheese. Yogurt. Peanut butter. Pumpkin seeds. Nuts. Sunflower seeds. Hard cheese. Milk or other milk products, such as soy milk. The items listed above may not be a complete list of foods and beverages you can eat. Contact a dietitian for more options. Summary Calcium and vitamin D are nutrients that play an important role in bone health and are an important part of your eating plan for osteoporosis. Eat balanced meals that contain protein foods, fruits and vegetables, and foods rich in calcium and vitamin D. Avoid foods that have more than 300 mg of sodium per serving. Too much sodium can cause your body to lose calcium. Exercise is an important part of prevention and treatment of osteoporosis. Aim for at least 30 minutes a day, 5 days a week. This information is not intended to replace advice given to you by your health care provider. Make sure you discuss any questions you have with your healthcare provider. Document Revised: 09/22/2019 Document Reviewed: 09/22/2019 Elsevier Patient Education  [employer].

## 2020-11-01 LAB — LIPID PANEL
Cholesterol: 170 mg/dL (ref <200)
HDL: 53 mg/dL (ref >=50)
LDL Cholesterol (Calc): 104 mg/dL (calc) — ABNORMAL HIGH
Non-HDL Cholesterol (Calc): 117 mg/dL (calc) (ref <130)
Total CHOL/HDL Ratio: 3.2 (calc) (ref <5.0)
Triglycerides: 45 mg/dL (ref <150)

## 2020-11-01 LAB — CBC WITH DIFFERENTIAL/PLATELET
Absolute Monocytes: 390 cells/uL (ref 200–950)
Basophils Absolute: 60 cells/uL (ref 0–200)
Basophils Relative: 1.2 %
Eosinophils Absolute: 110 cells/uL (ref 15–500)
Eosinophils Relative: 2.2 %
HCT: 36.8 % (ref 35.0–45.0)
Hemoglobin: 12.5 g/dL (ref 11.7–15.5)
Lymphs Abs: 1550 cells/uL (ref 850–3900)
MCH: 31.1 pg (ref 27.0–33.0)
MCHC: 34 g/dL (ref 32.0–36.0)
MCV: 91.5 fL (ref 80.0–100.0)
MPV: 11.6 fL (ref 7.5–12.5)
Monocytes Relative: 7.8 %
Neutro Abs: 2890 cells/uL (ref 1500–7800)
Neutrophils Relative %: 57.8 %
Platelets: 184 10*3/uL (ref 140–400)
RBC: 4.02 10*6/uL (ref 3.80–5.10)
RDW: 12.6 % (ref 11.0–15.0)
Total Lymphocyte: 31 %
WBC: 5 10*3/uL (ref 3.8–10.8)

## 2020-11-01 LAB — COMPLETE METABOLIC PANEL WITH GFR
AG Ratio: 1.8 (calc) (ref 1.0–2.5)
ALT: 14 U/L (ref 6–29)
AST: 21 U/L (ref 10–35)
Albumin: 4.3 g/dL (ref 3.6–5.1)
Alkaline phosphatase (APISO): 86 U/L (ref 37–153)
BUN: 23 mg/dL (ref 7–25)
CO2: 29 mmol/L (ref 20–32)
Calcium: 9.6 mg/dL (ref 8.6–10.4)
Chloride: 104 mmol/L (ref 98–110)
Creat: 0.72 mg/dL (ref 0.60–0.95)
Globulin: 2.4 g/dL (calc) (ref 1.9–3.7)
Glucose, Bld: 90 mg/dL (ref 65–99)
Potassium: 4.1 mmol/L (ref 3.5–5.3)
Sodium: 141 mmol/L (ref 135–146)
Total Bilirubin: 0.8 mg/dL (ref 0.2–1.2)
Total Protein: 6.7 g/dL (ref 6.1–8.1)
eGFR: 84 mL/min/{1.73_m2} (ref >=60)

## 2020-11-01 LAB — MAGNESIUM: Magnesium: 2.1 mg/dL (ref 1.5–2.5)

## 2020-11-01 LAB — TSH: TSH: 0.95 mIU/L (ref 0.40–4.50)

## 2020-11-01 LAB — VITAMIN D 25 HYDROXY (VIT D DEFICIENCY, FRACTURES): Vit D, 25-Hydroxy: 89 ng/mL (ref 30–100)

## 2020-11-12 DIAGNOSIS — D2261 Melanocytic nevi of right upper limb, including shoulder: Secondary | ICD-10-CM | POA: Diagnosis not present

## 2020-11-12 DIAGNOSIS — D692 Other nonthrombocytopenic purpura: Secondary | ICD-10-CM | POA: Diagnosis not present

## 2020-11-12 DIAGNOSIS — D2262 Melanocytic nevi of left upper limb, including shoulder: Secondary | ICD-10-CM | POA: Diagnosis not present

## 2020-11-12 DIAGNOSIS — L821 Other seborrheic keratosis: Secondary | ICD-10-CM | POA: Diagnosis not present

## 2020-11-12 DIAGNOSIS — D225 Melanocytic nevi of trunk: Secondary | ICD-10-CM | POA: Diagnosis not present

## 2020-11-12 DIAGNOSIS — L400 Psoriasis vulgaris: Secondary | ICD-10-CM | POA: Diagnosis not present

## 2020-11-26 ENCOUNTER — Other Ambulatory Visit: Payer: Self-pay | Admitting: Adult Health

## 2020-11-26 DIAGNOSIS — Z1231 Encounter for screening mammogram for malignant neoplasm of breast: Secondary | ICD-10-CM

## 2020-11-28 DIAGNOSIS — Z1211 Encounter for screening for malignant neoplasm of colon: Secondary | ICD-10-CM | POA: Diagnosis not present

## 2020-12-05 ENCOUNTER — Encounter: Payer: Self-pay | Admitting: Adult Health

## 2020-12-05 ENCOUNTER — Other Ambulatory Visit: Payer: Self-pay | Admitting: Adult Health

## 2020-12-05 DIAGNOSIS — R195 Other fecal abnormalities: Secondary | ICD-10-CM | POA: Insufficient documentation

## 2020-12-05 LAB — COLOGUARD: Cologuard: POSITIVE — AB

## 2021-01-15 ENCOUNTER — Other Ambulatory Visit: Payer: Self-pay

## 2021-01-15 ENCOUNTER — Ambulatory Visit
Admission: RE | Admit: 2021-01-15 | Discharge: 2021-01-15 | Disposition: A | Discharge status: Home / Self Care | Payer: Medicare HMO | Source: Ambulatory Visit | Attending: Adult Health | Admitting: Adult Health

## 2021-01-15 DIAGNOSIS — Z1231 Encounter for screening mammogram for malignant neoplasm of breast: Secondary | ICD-10-CM | POA: Diagnosis not present

## 2021-01-17 ENCOUNTER — Other Ambulatory Visit: Payer: Self-pay | Admitting: Gastroenterology

## 2021-01-17 ENCOUNTER — Ambulatory Visit
Admission: RE | Admit: 2021-01-17 | Discharge: 2021-01-17 | Disposition: A | Discharge status: Home / Self Care | Payer: Medicare HMO | Source: Ambulatory Visit | Attending: Gastroenterology | Admitting: Gastroenterology

## 2021-01-17 DIAGNOSIS — R195 Other fecal abnormalities: Secondary | ICD-10-CM | POA: Diagnosis not present

## 2021-01-17 DIAGNOSIS — M47816 Spondylosis without myelopathy or radiculopathy, lumbar region: Secondary | ICD-10-CM | POA: Diagnosis not present

## 2021-01-17 DIAGNOSIS — K649 Unspecified hemorrhoids: Secondary | ICD-10-CM | POA: Diagnosis not present

## 2021-01-17 DIAGNOSIS — R197 Diarrhea, unspecified: Secondary | ICD-10-CM

## 2021-02-20 DIAGNOSIS — R195 Other fecal abnormalities: Secondary | ICD-10-CM | POA: Diagnosis not present

## 2021-02-21 DIAGNOSIS — R195 Other fecal abnormalities: Secondary | ICD-10-CM | POA: Diagnosis not present

## 2021-04-03 DIAGNOSIS — Z1212 Encounter for screening for malignant neoplasm of rectum: Secondary | ICD-10-CM | POA: Diagnosis not present

## 2021-04-03 DIAGNOSIS — Z1211 Encounter for screening for malignant neoplasm of colon: Secondary | ICD-10-CM | POA: Diagnosis not present

## 2021-04-15 ENCOUNTER — Other Ambulatory Visit: Payer: Self-pay | Admitting: Internal Medicine

## 2021-04-15 DIAGNOSIS — Z1212 Encounter for screening for malignant neoplasm of rectum: Secondary | ICD-10-CM

## 2021-04-23 DIAGNOSIS — K6389 Other specified diseases of intestine: Secondary | ICD-10-CM | POA: Diagnosis not present

## 2021-04-23 DIAGNOSIS — R195 Other fecal abnormalities: Secondary | ICD-10-CM | POA: Diagnosis not present

## 2021-04-23 DIAGNOSIS — D120 Benign neoplasm of cecum: Secondary | ICD-10-CM | POA: Diagnosis not present

## 2021-04-23 DIAGNOSIS — K648 Other hemorrhoids: Secondary | ICD-10-CM | POA: Diagnosis not present

## 2021-04-23 DIAGNOSIS — K5730 Diverticulosis of large intestine without perforation or abscess without bleeding: Secondary | ICD-10-CM | POA: Diagnosis not present

## 2021-04-23 DIAGNOSIS — D123 Benign neoplasm of transverse colon: Secondary | ICD-10-CM | POA: Diagnosis not present

## 2021-04-23 LAB — HM COLONOSCOPY

## 2021-04-30 DIAGNOSIS — D123 Benign neoplasm of transverse colon: Secondary | ICD-10-CM | POA: Diagnosis not present

## 2021-04-30 DIAGNOSIS — D120 Benign neoplasm of cecum: Secondary | ICD-10-CM | POA: Diagnosis not present

## 2021-05-05 ENCOUNTER — Encounter: Payer: Self-pay | Admitting: Internal Medicine

## 2021-05-05 NOTE — Patient Instructions (Addendum)
Due to recent changes in healthcare laws, you may see the results of your imaging and laboratory studies on MyChart before your provider has had a chance to review them.  We understand that in some cases there may be results that are confusing or concerning to you. Not all laboratory results come back in the same time frame and the provider may be waiting for multiple results in order to interpret others.  Please give us 48 hours in order for your provider to thoroughly review all the results before contacting the office for clarification of your results.  ° °+++++++++++++++++++++++++ ° Vit D  & °Vit C 1,000 mg   °are recommended to help protect  °against the Covid-19 and other Corona viruses.  ° ° Also it's recommended  °to take  °Zinc 50 mg  °to help  °protect against the Covid-19   °and best place to get ° is also on [employer]  °and don't pay more than 6-8 cents /pill !  °================================ °Coronavirus (COVID-19) Are you at risk? ° °Are you at risk for the Coronavirus (COVID-19)? ° °To be considered HIGH RISK for Coronavirus (COVID-19), you have to meet the following criteria: ° °Traveled to [place], [place], [place], [place] or [place]; or in the [place] to [place], [place], [place]  °or [place]; and have fever, cough, and shortness of breath within the last 2 weeks of travel OR °Been in close contact with a person diagnosed with COVID-19 within the last 2 weeks and have  °fever, cough,and shortness of breath ° °IF YOU DO NOT MEET THESE CRITERIA, YOU ARE CONSIDERED LOW RISK FOR COVID-19. ° °What to do if you are HIGH RISK for COVID-19? ° °If you are having a medical emergency, call 911. °Seek medical care right away. Before you go to a doctor’s office, urgent care or emergency department, ° call ahead and tell them about your recent travel, contact with someone diagnosed with COVID-19  ° and your symptoms.  °You should receive instructions from your physician’s office regarding next  steps of care.  °When you arrive at healthcare provider, tell the healthcare staff immediately you have returned from  °visiting [place], [place], [place], [place] or [place]; or traveled in the [place] to [place], [place],  °[place] or [place] in the last two weeks or you have been in close contact with a person diagnosed with  °COVID-19 in the last 2 weeks.   °Tell the health care staff about your symptoms: fever, cough and shortness of breath. °After you have been seen by a medical provider, you will be either: °Tested for (COVID-19) and discharged home on quarantine except to seek medical care if  °symptoms worsen, and asked to  °Stay home and avoid contact with others until you get your results (4-5 days)  °Avoid travel on public transportation if possible (such as bus, train, or airplane) or °Sent to the Emergency Department by EMS for evaluation, COVID-19 testing  and  °possible admission depending on your condition and test results. ° °What to do if you are LOW RISK for COVID-19? ° °Reduce your risk of any infection by using the same precautions used for avoiding the common cold or flu:  °Wash your hands often with soap and warm water for at least 20 seconds.  If soap and water are not readily available,  °use an alcohol-based hand sanitizer with at least 60% alcohol.  °If coughing or sneezing, cover your mouth and nose by coughing   or sneezing into the elbow areas of your shirt or coat,  into a tissue or into your sleeve (not your hands). Avoid shaking hands with others and consider head nods or verbal greetings only. Avoid touching your eyes, nose, or mouth with unwashed hands.  Avoid close contact with people who are sick. Avoid places or events with large numbers of people in one location, like concerts or sporting events. Carefully consider travel plans you have or are making. If you are planning any travel outside or inside the [place], visit the [facility] webpage for the  latest health notices. If you have some symptoms but not all symptoms, continue to monitor at home and seek medical attention  if your symptoms worsen. If you are having a medical emergency, call 911.   >>>>>>>>>>>>>>>>>>>>>>>>>>>>>>>>>  We Do NOT Approve of [employer] > > > > > > > > > > > > > > > > > > > > > > > > > > > > > > > > > > > > > > >  Preventive Care for Adults  A healthy lifestyle and preventive care can promote health and wellness. Preventive health guidelines for women include the following key practices. A routine yearly physical is a good way to check with your health care provider about your health and preventive screening. It is a chance to share any concerns and updates on your health and to receive a thorough exam. Visit your dentist for a routine exam and preventive care every 6 months. Brush your teeth twice a day and floss once a day. Good oral hygiene prevents tooth decay and gum disease. The frequency of eye exams is based on your age, health, family medical history, use of contact lenses, and other factors. Follow your health care provider's recommendations for frequency of eye exams. Eat a healthy diet. Foods like vegetables, fruits, whole grains, low-fat dairy products, and lean protein foods contain the nutrients you need without too many calories. Decrease your intake of foods high in solid fats, added sugars, and salt. Eat the right amount of calories for you. Get information about a proper diet from your health care provider, if necessary. Regular physical exercise is one of the most important things you can do for your health. Most adults should get at least 150 minutes of moderate-intensity exercise (any activity that increases your heart rate and causes you to sweat) each week. In addition, most adults need muscle-strengthening exercises on 2 or more days a week. Maintain a healthy weight. The body mass index (BMI) is a screening tool to identify  possible weight problems. It provides an estimate of body fat based on height and weight. Your health care provider can find your BMI and can help you achieve or maintain a healthy weight. For adults 20 years and older: A BMI below 18.5 is considered underweight. A BMI of 18.5 to 24.9 is normal. A BMI of 25 to 29.9 is considered overweight. A BMI of 30 and above is considered obese. Maintain normal blood lipids and cholesterol levels by exercising and minimizing your intake of saturated fat. Eat a balanced diet with plenty of fruit and vegetables. If your lipid or cholesterol levels are high, you are over 50, or you are at high risk for heart disease, you may need your cholesterol levels checked more frequently. Ongoing high lipid and cholesterol levels should be treated with medicines if diet and exercise are not working. If you smoke, find out from  your health care provider how to quit. If you do not use tobacco, do not start. °Lung cancer screening is recommended for adults aged 55-80 years who are at high risk for developing lung cancer because of a history of smoking. A yearly low-dose CT scan of the lungs is recommended for people who have at least a 30-pack-year history of smoking and are a current smoker or have quit within the past 15 years. A pack year of smoking is smoking an average of 1 pack of cigarettes a day for 1 year (for example: 1 pack a day for 30 years or 2 packs a day for 15 years). Yearly screening should continue until the smoker has stopped smoking for at least 15 years. Yearly screening should be stopped for people who develop a health problem that would prevent them from having lung cancer treatment. °Avoid use of street drugs. Do not share needles with anyone. Ask for help if you need support or instructions about stopping the use of drugs. °High blood pressure causes heart disease and increases the risk of stroke.  Ongoing high blood pressure should be treated with medicines if  weight loss and exercise do not work. °If you are 55-79 years old, ask your health care provider if you should take aspirin to prevent strokes. °Diabetes screening involves taking a blood sample to check your fasting blood sugar level. This should be done once every 3 years, after age 45, if you are within normal weight and without risk factors for diabetes. Testing should be considered at a younger age or be carried out more frequently if you are overweight and have at least 1 risk factor for diabetes. °Breast cancer screening is essential preventive care for women. You should practice "breast self-awareness." This means understanding the normal appearance and feel of your breasts and may include breast self-examination. Any changes detected, no matter how small, should be reported to a health care provider. Women in their 20s and 30s should have a clinical breast exam (CBE) by a health care provider as part of a regular health exam every 1 to 3 years. After age 40, women should have a CBE every year. Starting at age 40, women should consider having a mammogram (breast X-ray test) every year. Women who have a family history of breast cancer should talk to their health care provider about genetic screening. Women at a high risk of breast cancer should talk to their health care providers about having an MRI and a mammogram every year. °Breast cancer gene (BRCA)-related cancer risk assessment is recommended for women who have family members with BRCA-related cancers. BRCA-related cancers include breast, ovarian, tubal, and peritoneal cancers. Having family members with these cancers may be associated with an increased risk for harmful changes (mutations) in the breast cancer genes BRCA1 and BRCA2. Results of the assessment will determine the need for genetic counseling and BRCA1 and BRCA2 testing. °Routine pelvic exams to screen for cancer are no longer recommended for nonpregnant women who are considered low risk for  cancer of the pelvic organs (ovaries, uterus, and vagina) and who do not have symptoms. Ask your health care provider if a screening pelvic exam is right for you. °If you have had past treatment for cervical cancer or a condition that could lead to cancer, you need Pap tests and screening for cancer for at least 20 years after your treatment. If Pap tests have been discontinued, your risk factors (such as having a new sexual partner) need to be   reassessed to determine if screening should be resumed. Some women have medical problems that increase the chance of getting cervical cancer. In these cases, your health care provider may recommend more frequent screening and Pap tests.   .    A positive Cologuard result should be followed with a colonoscopy or visual examination of the colon.   Colorectal cancer can be detected and often prevented. Most routine colorectal cancer screening begins at the age of 69 years and continues through age 10 years. However, your health care provider may recommend screening at an earlier age if you have risk factors for colon cancer. On a yearly basis, your health care provider may provide home test kits to check for hidden blood in the stool. Use of a small camera at the end of a tube, to directly examine the colon (sigmoidoscopy or colonoscopy), can detect the earliest forms of colorectal cancer. Talk to your health care provider about this at age 78, when routine screening begins.  Direct exam of the colon should be repeated every 5-10 years through age 8 years, unless early forms of pre-cancerous polyps or small growths are found.   Osteoporosis is a disease in which the bones lose minerals and strength with aging. This can result in serious bone fractures or breaks. The risk of osteoporosis can be identified using a bone density scan. Women ages 17 years and over and women at risk for fractures or osteoporosis should discuss screening with their health care providers. Ask  your health care provider whether you should take a calcium supplement or vitamin D to reduce the rate of osteoporosis. Menopause can be associated with physical symptoms and risks. Hormone replacement therapy is available to decrease symptoms and risks. You should talk to your health care provider about whether hormone replacement therapy is right for you. Use sunscreen. Apply sunscreen liberally and repeatedly throughout the day. You should seek shade when your shadow is shorter than you. Protect yourself by wearing long sleeves, pants, a wide-brimmed hat, and sunglasses year round, whenever you are outdoors. Once a month, do a whole body skin exam, using a mirror to look at the skin on your back. Tell your health care provider of new moles, moles that have irregular borders, moles that are larger than a pencil eraser, or moles that have changed in shape or color. Stay current with required vaccines (immunizations). Influenza vaccine. All adults should be immunized every year. Tetanus, diphtheria, and acellular pertussis (Td, Tdap) vaccine. Pregnant women should receive 1 dose of Tdap vaccine during each pregnancy. The dose should be obtained regardless of the length of time since the last dose. Immunization is preferred during the 27th-36th week of gestation. An adult who has not previously received Tdap or who does not know her vaccine status should receive 1 dose of Tdap. This initial dose should be followed by tetanus and diphtheria toxoids (Td) booster doses every 10 years. Adults with an unknown or incomplete history of completing a 3-dose immunization series with Td-containing vaccines should begin or complete a primary immunization series including a Tdap dose. Adults should receive a Td booster every 10 years.  Zoster vaccine. One dose is recommended for adults aged 42 years or older unless certain conditions are present.  Pneumococcal 13-valent conjugate (PCV13) vaccine. When indicated, a  person who is uncertain of her immunization history and has no record of immunization should receive the PCV13 vaccine. An adult aged 26 years or older who has certain medical conditions and has not  been previously immunized should receive 1 dose of PCV13 vaccine. This PCV13 should be followed with a dose of pneumococcal polysaccharide (PPSV23) vaccine. The PPSV23 vaccine dose should be obtained at least 1 or more year(s) after the dose of PCV13 vaccine. An adult aged 54 years or older who has certain medical conditions and previously received 1 or more doses of PPSV23 vaccine should receive 1 dose of PCV13. The PCV13 vaccine dose should be obtained 1 or more years after the last PPSV23 vaccine dose.  Pneumococcal polysaccharide (PPSV23) vaccine. When PCV13 is also indicated, PCV13 should be obtained first. All adults aged 40 years and older should be immunized. An adult younger than age 29 years who has certain medical conditions should be immunized. Any person who resides in a nursing home or long-term care facility should be immunized. An adult smoker should be immunized. People with an immunocompromised condition and certain other conditions should receive both PCV13 and PPSV23 vaccines. People with human immunodeficiency virus (HIV) infection should be immunized as soon as possible after diagnosis. Immunization during chemotherapy or radiation therapy should be avoided. Routine use of PPSV23 vaccine is not recommended for American Indians, [place] Natives, or people younger than 65 years unless there are medical conditions that require PPSV23 vaccine. When indicated, people who have unknown immunization and have no record of immunization should receive PPSV23 vaccine. One-time revaccination 5 years after the first dose of PPSV23 is recommended for people aged 19-64 years who have chronic kidney failure, nephrotic syndrome, asplenia, or immunocompromised conditions. People who received 1-2 doses of PPSV23  before age 27 years should receive another dose of PPSV23 vaccine at age 78 years or later if at least 5 years have passed since the previous dose. Doses of PPSV23 are not needed for people immunized with PPSV23 at or after age 56 years.  Preventive Services / Frequency  Ages 39 years and over Blood pressure check. Lipid and cholesterol check. Lung cancer screening. / Every year if you are aged 20-80 years and have a 30-pack-year history of smoking and currently smoke or have quit within the past 15 years. Yearly screening is stopped once you have quit smoking for at least 15 years or develop a health problem that would prevent you from having lung cancer treatment. Clinical breast exam.** / Every year after age 24 years.  BRCA-related cancer risk assessment.** / For women who have family members with a BRCA-related cancer (breast, ovarian, tubal, or peritoneal cancers). Mammogram.** / Every year beginning at age 14 years and continuing for as long as you are in good health. Consult with your health care provider. Pap test.** / Every 3 years starting at age 70 years through age 51 or 78 years with 3 consecutive normal Pap tests. Testing can be stopped between 65 and 70 years with 3 consecutive normal Pap tests and no abnormal Pap or HPV tests in the past 10 years. Fecal occult blood test (FOBT) of stool. / Every year beginning at age 67 years and continuing until age 20 years. You may not need to do this test if you get a colonoscopy every 10 years. Flexible sigmoidoscopy or colonoscopy.** / Every 5 years for a flexible sigmoidoscopy or every 10 years for a colonoscopy beginning at age 110 years and continuing until age 92 years. Hepatitis C blood test.** / For all people born from [birth year] through 1966 and any individual with known risks for hepatitis C. Osteoporosis screening.** / A one-time screening for women ages 57 years and  over and women at risk for fractures or osteoporosis. Skin self-exam. /  Monthly. Influenza vaccine. / Every year. Tetanus, diphtheria, and acellular pertussis (Tdap/Td) vaccine.** / 1 dose of Td every 10 years. Zoster vaccine.** / 1 dose for adults aged 79 years or older. Pneumococcal 13-valent conjugate (PCV13) vaccine.** / Consult your health care provider. Pneumococcal polysaccharide (PPSV23) vaccine.** / 1 dose for all adults aged 56 years and older. Screening for abdominal aortic aneurysm (AAA)  by ultrasound is recommended for people who have history of high blood pressure or who are current or former smokers. ++++++++++++++++++++ Recommend Adult Low Dose Aspirin or  coated  Aspirin 81 mg daily  To reduce risk of Colon Cancer 40 %,  Skin Cancer 26 % ,  Melanoma 46%  and  Pancreatic cancer 60% ++++++++++++++++++++ Vitamin D goal  is between 70-100.  Please make sure that you are taking your Vitamin D as directed.  It is very important as a natural anti-inflammatory  helping hair, skin, and nails, as well as reducing stroke and heart attack risk.  It helps your bones and helps with mood. It also decreases numerous cancer risks so please take it as directed.  Low Vit D is associated with a 200-300% higher risk for CANCER  and 200-300% higher risk for HEART   ATTACK  &  STROKE.   .....................................Marland Kitchen It is also associated with higher death rate at younger ages,  autoimmune diseases like Rheumatoid arthritis, Lupus, Multiple Sclerosis.    Also many other serious conditions, like depression, Alzheimer's Dementia, infertility, muscle aches, fatigue, fibromyalgia - just to name a few. ++++++++++++++++++ Recommend the book "The END of DIETING" by Dr Excell Seltzer  & the book "The END of DIABETES " by Dr Excell Seltzer At [email or web address] - get book & Audio CD's    Being diabetic has a  300% increased risk for heart attack, stroke, cancer, and alzheimer- type vascular dementia. It is very important that you work harder with diet by avoiding all  foods that are white. Avoid white rice (brown & wild rice is OK), white potatoes (sweetpotatoes in moderation is OK), White bread or wheat bread or anything made out of white flour like bagels, donuts, rolls, buns, biscuits, cakes, pastries, cookies, pizza crust, and pasta (made from white flour & egg whites) - vegetarian pasta or spinach or wheat pasta is OK. Multigrain breads like Arnold's or Pepperidge [employer], or multigrain sandwich thins or flatbreads.  Diet, exercise and weight loss can reverse and cure diabetes in the early stages.  Diet, exercise and weight loss is very important in the control and prevention of complications of diabetes which affects every system in your body, ie. Brain - dementia/stroke, eyes - glaucoma/blindness, heart - heart attack/heart failure, kidneys - dialysis, stomach - gastric paralysis, intestines - malabsorption, nerves - severe painful neuritis, circulation - gangrene & loss of a leg(s), and finally cancer and Alzheimers.    I recommend avoid fried & greasy foods,  sweets/candy, white rice (brown or wild rice or Quinoa is OK), white potatoes (sweet potatoes are OK) - anything made from white flour - bagels, doughnuts, rolls, buns, biscuits,white and wheat breads, pizza crust and traditional pasta made of white flour & egg white(vegetarian pasta or spinach or wheat pasta is OK).  Multi-grain bread is OK - like multi-grain flat bread or sandwich thins. Avoid alcohol in excess. Exercise is also important.    Eat all the vegetables you want - avoid meat, especially red meat and dairy -  especially cheese.  Cheese is the most concentrated form of trans-fats which is the worst thing to clog up our arteries. Veggie cheese is OK which can be found in the fresh produce section at [employer] or [employer] or [employer]  +++++++++++++++++++ DASH Eating Plan  DASH stands for "Dietary Approaches to Stop Hypertension."   The DASH eating plan is a healthy eating plan that has been  shown to reduce high blood pressure (hypertension). Additional health benefits may include reducing the risk of type 2 diabetes mellitus, heart disease, and stroke. The DASH eating plan may also help with weight loss. WHAT DO I NEED TO KNOW ABOUT THE DASH EATING PLAN? For the DASH eating plan, you will follow these general guidelines: Choose foods with a percent daily value for sodium of less than 5% (as listed on the food label). Use salt-free seasonings or herbs instead of table salt or sea salt. Check with your health care provider or pharmacist before using salt substitutes. Eat lower-sodium products, often labeled as "lower sodium" or "no salt added." Eat fresh foods. Eat more vegetables, fruits, and low-fat dairy products. Choose whole grains. Look for the word "whole" as the first word in the ingredient list. Choose fish  Limit sweets, desserts, sugars, and sugary drinks. Choose heart-healthy fats. Eat veggie cheese  Eat more home-cooked food and less restaurant, buffet, and fast food. Limit fried foods. Cook foods using methods other than frying. Limit canned vegetables. If you do use them, rinse them well to decrease the sodium. When eating at a restaurant, ask that your food be prepared with less salt, or no salt if possible.                      WHAT FOODS CAN I EAT? Read Dr Fara Olden Fuhrman's books on The End of Dieting & The End of Diabetes  Grains Whole grain or whole wheat bread. Brown rice. Whole grain or whole wheat pasta. Quinoa, bulgur, and whole grain cereals. Low-sodium cereals. Corn or whole wheat flour tortillas. Whole grain cornbread. Whole grain crackers. Low-sodium crackers.  Vegetables Fresh or frozen vegetables (raw, steamed, roasted, or grilled). Low-sodium or reduced-sodium tomato and vegetable juices. Low-sodium or reduced-sodium tomato sauce and paste. Low-sodium or reduced-sodium canned vegetables.   Fruits All fresh, canned (in natural juice), or frozen  fruits.  Protein Products  All fish and seafood.  Dried beans, peas, or lentils. Unsalted nuts and seeds. Unsalted canned beans.  Dairy Low-fat dairy products, such as skim or 1% milk, 2% or reduced-fat cheeses, low-fat ricotta or cottage cheese, or plain low-fat yogurt. Low-sodium or reduced-sodium cheeses.  Fats and Oils Tub margarines without trans fats. Light or reduced-fat mayonnaise and salad dressings (reduced sodium). Avocado. Safflower, olive, or canola oils. Natural peanut or almond butter.  Other Unsalted popcorn and pretzels. The items listed above may not be a complete list of recommended foods or beverages. Contact your dietitian for more options.  +++++++++++++++  WHAT FOODS ARE NOT RECOMMENDED? Grains/ White flour or wheat flour White bread. White pasta. White rice. Refined cornbread. Bagels and croissants. Crackers that contain trans fat.  Vegetables  Creamed or fried vegetables. Vegetables in a . Regular canned vegetables. Regular canned tomato sauce and paste. Regular tomato and vegetable juices.  Fruits Dried fruits. Canned fruit in light or heavy syrup. Fruit juice.  Meat and Other Protein Products Meat in general - RED meat & White meat.  Fatty cuts of meat. Ribs, chicken wings, all processed  meats as bacon, sausage, bologna, salami, fatback, hot dogs, bratwurst and packaged luncheon meats.  Dairy Whole or 2% milk, cream, half-and-half, and cream cheese. Whole-fat or sweetened yogurt. Full-fat cheeses or blue cheese. Non-dairy creamers and whipped toppings. Processed cheese, cheese spreads, or cheese curds.  Condiments Onion and garlic salt, seasoned salt, table salt, and sea salt. Canned and packaged gravies. Worcestershire sauce. Tartar sauce. Barbecue sauce. Teriyaki sauce. Soy sauce, including reduced sodium. Steak sauce. Fish sauce. Oyster sauce. Cocktail sauce. Horseradish. Ketchup and mustard. Meat flavorings and tenderizers. Bouillon cubes. Hot sauce.  Tabasco sauce. Marinades. Taco seasonings. Relishes.  Fats and Oils Butter, stick margarine, lard, shortening and bacon fat. Coconut, palm kernel, or palm oils. Regular salad dressings.  Pickles and olives. Salted popcorn and pretzels.  The items listed above may not be a complete list of foods and beverages to avoid.

## 2021-05-05 NOTE — Progress Notes (Addendum)
Annual Screening/Preventative Visit & Comprehensive Evaluation &  Examination  Future Appointments  Date Time Provider Department  05/06/2021  3:00 PM Unk Pinto, MD [facility]  10/31/2021 10:30 AM Liane Comber, NP [facility]  05/08/2022  3:00 PM Unk Pinto, MD [facility]        This very nice 81 y.o. WWF presents for a Screening /Preventative Visit & comprehensive evaluation and management of multiple medical co-morbidities.  Patient has been followed for labile HTN, HLD, Prediabetes  and Vitamin D Deficiency. Patient is also Followed by Dr Philemon Kingdom for Osteoporosis.         Patient is followed expectantly for labile HTN .   Patient's BP has been controlled and patient denies any cardiac symptoms as chest pain, palpitations, shortness of breath, dizziness or ankle swelling. Today's BP is elevated at 161/84 & rechecked at 165/89.       Patient has hx/o Statin intolerance and her hyperlipidemia is near controlled with diet and ezetimibe /Gemfibrozil.  Patient denies myalgias or other medication SE's. Last lipids were near goal :  Lab Results  Component Value Date   CHOL 170 10/31/2020   HDL 53 10/31/2020   LDLCALC 104 (H) 10/31/2020   TRIG 45 10/31/2020   CHOLHDL 3.2 10/31/2020         Patient has hx/o prediabetes  (A1c 5.8% /2015) and patient denies reactive hypoglycemic symptoms, visual blurring, diabetic polys or paresthesias. Last A1c was Normal & at goal :  Lab Results  Component Value Date   HGBA1C 5.6 04/18/2020         Finally, patient has history of Vitamin D Deficiency ("34" /2010) and last Vitamin D was at  goal :  Lab Results  Component Value Date   VD25OH 89 10/31/2020     Patient has stopped ALL of her medicines for no apparent reason  Medication Sig             Allergies  Allergen Reactions   Atorvastatin Other (See Comments)    myalgia   Fosamax [Alendronate Sodium] Other (See Comments)    Myalgia     Past Medical  History:  Diagnosis Date   History of ITP    Hyperlipidemia    IBS (irritable bowel syndrome)    Osteopenia    Psoriasis      Health Maintenance  Topic Date Due   Zoster Vaccines- Shingrix (1 of 2) Never done   COVID-19 Vaccine (4 - Booster for Pfizer series) 03/28/2020   INFLUENZA VACCINE  11/19/2020   TETANUS/TDAP  12/01/2028   Pneumonia Vaccine 64+ Years old  Completed   DEXA SCAN  Completed   HPV VACCINES  Aged Out     Immunization History  Administered Date(s) Administered   Fluad Quad(high Dose) 02/15/2020   Influenza Split 01/11/2019   Influenza, High Dose  01/25/2014, 01/22/2017   Influenza,inj,quad 01/19/2017   Influenza 12/21/2014, 01/22/2016, 01/18/2018   PFIZER SARS-COV-2 Vacc 05/11/2019, 06/01/2019, 02/01/2020   PPD Test 03/14/2013   Pneumococcal -13 12/05/2015   Pneumococcal -23 02/08/2009, 06/30/2014   Td 02/08/2009   Tdap 12/02/2018   Zoster, Live 03/29/2009    Last Colon - 03/14/2013 - Patient refused  Cologuard - 07/31/ 2019 -Negative - Recc 3 yr  f/u  Cologuard -11/28/2020 - Positive  - Colonoscopy recommended  Cologuard - 04/03/2021 - Positive   Colonoscopy - 04/23/2021 - Dr Megan Salon Therisa Doyne  [facility] ) - Results pending from 12 days ago .    Last MGM -  01/15/2021   Past Surgical History:  Procedure Laterality Date   ABDOMINAL HYSTERECTOMY     APPENDECTOMY     BREAST SURGERY Right    Biospy, benign   EYE SURGERY Bilateral    Cataracts   EYE SURGERY     Lense implants   TUBAL LIGATION       Family History  Problem Relation Age of Onset   AAA (abdominal aortic aneurysm) Mother    Hypertension Mother    Hypertension Father    Heart disease Father    Hyperlipidemia Father    Cancer Maternal Aunt        uterine     Social History   Tobacco Use   Smoking status: Never   Smokeless tobacco: Never  Substance Use Topics   Alcohol use: Yes    Comment: occasional wine 1-2 drinks   Drug use: No      ROS Constitutional:  Denies fever, chills, weight loss/gain, headaches, insomnia,  night sweats, and change in appetite. Does c/o fatigue. Eyes: Denies redness, blurred vision, diplopia, discharge, itchy, watery eyes.  ENT: Denies discharge, congestion, post nasal drip, epistaxis, sore throat, earache, hearing loss, dental pain, Tinnitus, Vertigo, Sinus pain, snoring.  Cardio: Denies chest pain, palpitations, irregular heartbeat, syncope, dyspnea, diaphoresis, orthopnea, PND, claudication, edema Respiratory: denies cough, dyspnea, DOE, pleurisy, hoarseness, laryngitis, wheezing.  Gastrointestinal: Denies dysphagia, heartburn, reflux, water brash, pain, cramps, nausea, vomiting, bloating, diarrhea, constipation, hematemesis, melena, hematochezia, jaundice, hemorrhoids Genitourinary: Denies dysuria, frequency, urgency, nocturia, hesitancy, discharge, hematuria, flank pain Breast: Breast lumps, nipple discharge, bleeding.  Musculoskeletal: Denies arthralgia, myalgia, stiffness, Jt. Swelling, pain, limp, and strain/sprain. Denies falls. Skin: Denies puritis, rash, hives, warts, acne, eczema, changing in skin lesion Neuro: No weakness, tremor, incoordination, spasms, paresthesia, pain Psychiatric: Denies confusion, memory loss, sensory loss. Denies Depression. Endocrine: Denies change in weight, skin, hair change, nocturia, and paresthesia, diabetic polys, visual blurring, hyper / hypo glycemic episodes.  Heme/Lymph: No excessive bleeding, bruising, enlarged lymph nodes.  Physical Exam  BP (!) 161/84    Pulse 65    Temp 97.9 F (36.6 C)    Resp 16    Ht 5\' 5"  (1.651 m)    Wt 137 lb 12.8 oz (62.5 kg)    SpO2 99%    BMI 22.93 kg/m   General Appearance: Well nourished, well groomed and in no apparent distress.  Eyes: PERRLA, EOMs, conjunctiva no swelling or erythema, normal fundi and vessels. Sinuses: No frontal/maxillary tenderness ENT/Mouth: EACs patent / TMs  nl. Nares clear without erythema, swelling, mucoid  exudates. Oral hygiene is good. No erythema, swelling, or exudate. Tongue normal, non-obstructing. Tonsils not swollen or erythematous. Hearing normal.  Neck: Supple, thyroid not palpable. No bruits, nodes or JVD. Respiratory: Respiratory effort normal.  BS equal and clear bilateral without rales, rhonci, wheezing or stridor. Cardio: Heart sounds are normal with regular rate and rhythm and no murmurs, rubs or gallops. Peripheral pulses are normal and equal bilaterally without edema. No aortic or femoral bruits. Chest: symmetric with normal excursions and percussion. Breasts: Symmetric, without lumps, nipple discharge, retractions, or fibrocystic changes.  Abdomen: Flat, soft with bowel sounds active. Nontender, no guarding, rebound, hernias, masses, or organomegaly.  Lymphatics: Non tender without lymphadenopathy.  Musculoskeletal: Full ROM all peripheral extremities, joint stability, 5/5 strength, and normal gait. Skin: Warm and dry without rashes, lesions, cyanosis, clubbing or  ecchymosis.  Neuro: Cranial nerves intact, reflexes equal bilaterally. Normal muscle tone, no cerebellar symptoms. Sensation intact.  Pysch: Alert  and oriented X 3, normal affect, Insight and Judgment appropriate.    Assessment and Plan  1. Annual Preventative Screening Examination   2. Positive colorectal cancer screening using Cologuard test  - Referred to [facility]    3. Elevated BP w/o Dx HTN  -Patient is strongly encouraged to monitor BP's 2 x /day &                                                    call if BP's are elevated over 140/90.   - EKG 12-Lead - Urinalysis, Routine w reflex microscopic - Microalbumin / creatinine urine ratio - CBC with Differential/Platelet - COMPLETE METABOLIC PANEL WITH GFR - Magnesium - TSH  4. Hyperlipidemia, mixed  - EKG 12-Lead - Lipid panel - TSH  5. Statin myopathy   6. Abnormal glucose  - Hemoglobin A1c - Insulin, random  7. Vitamin D  deficiency  - VITAMIN D 25 Hydroxy   8. Age-related osteoporosis without current pathological fracture   9. Screening for ischemic heart disease  - EKG 12-Lead  10. FHx: heart disease  - EKG 12-Lead  11. Medication management  - Urinalysis, Routine w reflex microscopic - Microalbumin / creatinine urine ratio - CBC with Differential/Platelet - COMPLETE METABOLIC PANEL WITH GFR - Magnesium - Lipid panel - TSH - Hemoglobin A1c - VITAMIN D 25 Hydroxy - Insulin, random        Patient was counseled in prudent diet to achieve/maintain BMI less than 25 for weight control, BP monitoring, regular exercise and medications. Discussed med's effects and SE's. Screening labs and tests as requested with regular follow-up as recommended. Over 40 minutes of exam, counseling, chart review and high complex critical decision making was performed.   Kirtland Bouchard, MD

## 2021-05-06 ENCOUNTER — Other Ambulatory Visit: Payer: Self-pay

## 2021-05-06 ENCOUNTER — Ambulatory Visit (INDEPENDENT_AMBULATORY_CARE_PROVIDER_SITE_OTHER): Payer: Medicare HMO | Admitting: Internal Medicine

## 2021-05-06 ENCOUNTER — Encounter: Payer: Self-pay | Admitting: Internal Medicine

## 2021-05-06 VITALS — BP 161/84 | HR 65 | Temp 97.9°F | Resp 16 | Ht 65.0 in | Wt 137.8 lb

## 2021-05-06 DIAGNOSIS — R7309 Other abnormal glucose: Secondary | ICD-10-CM

## 2021-05-06 DIAGNOSIS — Z136 Encounter for screening for cardiovascular disorders: Secondary | ICD-10-CM

## 2021-05-06 DIAGNOSIS — M810 Age-related osteoporosis without current pathological fracture: Secondary | ICD-10-CM

## 2021-05-06 DIAGNOSIS — Z79899 Other long term (current) drug therapy: Secondary | ICD-10-CM | POA: Diagnosis not present

## 2021-05-06 DIAGNOSIS — R195 Other fecal abnormalities: Secondary | ICD-10-CM

## 2021-05-06 DIAGNOSIS — Z0001 Encounter for general adult medical examination with abnormal findings: Secondary | ICD-10-CM

## 2021-05-06 DIAGNOSIS — T466X5A Adverse effect of antihyperlipidemic and antiarteriosclerotic drugs, initial encounter: Secondary | ICD-10-CM

## 2021-05-06 DIAGNOSIS — Z8249 Family history of ischemic heart disease and other diseases of the circulatory system: Secondary | ICD-10-CM

## 2021-05-06 DIAGNOSIS — E559 Vitamin D deficiency, unspecified: Secondary | ICD-10-CM

## 2021-05-06 DIAGNOSIS — E782 Mixed hyperlipidemia: Secondary | ICD-10-CM | POA: Diagnosis not present

## 2021-05-06 DIAGNOSIS — I10 Essential (primary) hypertension: Secondary | ICD-10-CM

## 2021-05-06 DIAGNOSIS — R030 Elevated blood-pressure reading, without diagnosis of hypertension: Secondary | ICD-10-CM | POA: Diagnosis not present

## 2021-05-06 DIAGNOSIS — G720 Drug-induced myopathy: Secondary | ICD-10-CM

## 2021-05-06 DIAGNOSIS — Z0000 Encounter for general adult medical examination without abnormal findings: Secondary | ICD-10-CM

## 2021-05-06 NOTE — Addendum Note (Signed)
Addended by: Unk Pinto on: 05/06/2021 09:03 PM   Modules accepted: Orders

## 2021-05-07 ENCOUNTER — Other Ambulatory Visit: Payer: Self-pay | Admitting: Internal Medicine

## 2021-05-07 DIAGNOSIS — N3000 Acute cystitis without hematuria: Secondary | ICD-10-CM

## 2021-05-07 LAB — COMPLETE METABOLIC PANEL WITH GFR
AG Ratio: 1.4 (calc) (ref 1.0–2.5)
ALT: 17 U/L (ref 6–29)
AST: 20 U/L (ref 10–35)
Albumin: 4.2 g/dL (ref 3.6–5.1)
Alkaline phosphatase (APISO): 87 U/L (ref 37–153)
BUN/Creatinine Ratio: 27 (calc) — ABNORMAL HIGH (ref 6–22)
BUN: 16 mg/dL (ref 7–25)
CO2: 30 mmol/L (ref 20–32)
Calcium: 9.4 mg/dL (ref 8.6–10.4)
Chloride: 106 mmol/L (ref 98–110)
Creat: 0.59 mg/dL — ABNORMAL LOW (ref 0.60–0.95)
Globulin: 2.9 g/dL (calc) (ref 1.9–3.7)
Glucose, Bld: 79 mg/dL (ref 65–99)
Potassium: 3.9 mmol/L (ref 3.5–5.3)
Sodium: 142 mmol/L (ref 135–146)
Total Bilirubin: 0.7 mg/dL (ref 0.2–1.2)
Total Protein: 7.1 g/dL (ref 6.1–8.1)
eGFR: 90 mL/min/{1.73_m2} (ref >=60)

## 2021-05-07 LAB — URINALYSIS, ROUTINE W REFLEX MICROSCOPIC
Bacteria, UA: NONE SEEN /HPF
Bilirubin Urine: NEGATIVE
Glucose, UA: NEGATIVE
Hgb urine dipstick: NEGATIVE
Hyaline Cast: NONE SEEN /LPF
Ketones, ur: NEGATIVE
Nitrite: NEGATIVE
Protein, ur: NEGATIVE
Specific Gravity, Urine: 1.022 (ref 1.001–1.035)
Squamous Epithelial / HPF: NONE SEEN /HPF (ref <=5)
WBC, UA: >=60 /HPF — AB (ref 0–5)
pH: 5.5 (ref 5.0–8.0)

## 2021-05-07 LAB — CBC WITH DIFFERENTIAL/PLATELET
Absolute Monocytes: 424 cells/uL (ref 200–950)
Basophils Absolute: 69 cells/uL (ref 0–200)
Basophils Relative: 1.3 %
Eosinophils Absolute: 143 cells/uL (ref 15–500)
Eosinophils Relative: 2.7 %
HCT: 36.8 % (ref 35.0–45.0)
Hemoglobin: 12.3 g/dL (ref 11.7–15.5)
Lymphs Abs: 1617 cells/uL (ref 850–3900)
MCH: 30.4 pg (ref 27.0–33.0)
MCHC: 33.4 g/dL (ref 32.0–36.0)
MCV: 90.9 fL (ref 80.0–100.0)
MPV: 11.8 fL (ref 7.5–12.5)
Monocytes Relative: 8 %
Neutro Abs: 3048 cells/uL (ref 1500–7800)
Neutrophils Relative %: 57.5 %
Platelets: 190 10*3/uL (ref 140–400)
RBC: 4.05 10*6/uL (ref 3.80–5.10)
RDW: 12.5 % (ref 11.0–15.0)
Total Lymphocyte: 30.5 %
WBC: 5.3 10*3/uL (ref 3.8–10.8)

## 2021-05-07 LAB — LIPID PANEL
Cholesterol: 204 mg/dL — ABNORMAL HIGH (ref <200)
HDL: 55 mg/dL (ref >=50)
LDL Cholesterol (Calc): 131 mg/dL (calc) — ABNORMAL HIGH
Non-HDL Cholesterol (Calc): 149 mg/dL (calc) — ABNORMAL HIGH (ref <130)
Total CHOL/HDL Ratio: 3.7 (calc) (ref <5.0)
Triglycerides: 83 mg/dL (ref <150)

## 2021-05-07 LAB — HEMOGLOBIN A1C
Hgb A1c MFr Bld: 5.8 % of total Hgb — ABNORMAL HIGH (ref <5.7)
Mean Plasma Glucose: 120 mg/dL
eAG (mmol/L): 6.6 mmol/L

## 2021-05-07 LAB — MICROALBUMIN / CREATININE URINE RATIO
Creatinine, Urine: 147 mg/dL (ref 20–275)
Microalb Creat Ratio: 10 mcg/mg creat (ref <30)
Microalb, Ur: 1.4 mg/dL

## 2021-05-07 LAB — TSH: TSH: 1.23 mIU/L (ref 0.40–4.50)

## 2021-05-07 LAB — MICROSCOPIC MESSAGE

## 2021-05-07 LAB — MAGNESIUM: Magnesium: 2 mg/dL (ref 1.5–2.5)

## 2021-05-07 LAB — INSULIN, RANDOM: Insulin: 2.3 u[IU]/mL

## 2021-05-07 LAB — VITAMIN D 25 HYDROXY (VIT D DEFICIENCY, FRACTURES): Vit D, 25-Hydroxy: 41 ng/mL (ref 30–100)

## 2021-05-07 NOTE — Progress Notes (Signed)
============================================================ - Test results slightly outside the reference range are not unusual. If there is anything important, I will review this with you,  otherwise it is considered normal test values.  If you have further questions,  please do not hesitate to contact me at the office or via My Chart.  ============================================================ ============================================================  -  U/A shows increased WBC - will request lab to do culture to check for                                                infection - may take 2 -3 days to get results back  ============================================================ ============================================================  -  Total  Chol =  204     - Elevated             (  Ideal  or  Goal is less than 180  !  )   - and   -  Bad / Dangerous LDL  Chol = 131    - also Elevated              (  Ideal  or  Goal is less than 70  !  )   ============================================================ ============================================================  - Recommend a stricter low cholesterol diet    - Cholesterol only comes from animal sources  - ie. meat, dairy, egg yolks  - Eat all the vegetables you want.  - Avoid meat, especially red meat - Beef AND Pork .  - Avoid cheese & dairy - milk & ice cream.     - Cheese is the most concentrated form of trans-fats which  is the worst thing to clog up our arteries.   - Veggie cheese is OK which can be found in the fresh  produce section at [employer] or [employer] or [employer] ============================================================ ============================================================  - A1c is up slightly to 5.8%  which is elevated in the borderline and  early or pre-diabetes range which has the same   300% increased risk for heart attack, stroke, cancer and   alzheimer- type  vascular dementia as full blown diabetes.   - But the good news is that diet, exercise with  weight loss can cure the early diabetes at this point.  It is very important that you work harder with diet by  avoiding all foods that are white except chicken,   fish & calliflower.  - Avoid white rice  (brown & wild rice is OK),   - Avoid white potatoes  (sweet potatoes in moderation is OK),   White bread or wheat bread or anything made out of   white flour like bagels, donuts, rolls, buns, biscuits, cakes,  - pastries, cookies, pizza crust, and pasta (made from  white flour & egg whites)   - vegetarian pasta or spinach or wheat pasta is OK.  - Multigrain breads like Arnold's, [employer] or   multigrain sandwich thins or high fiber breads like   [place] bread or "Dave's Killer" breads that are  4 to 5 grams fiber per slice !  are best.    Diet, exercise and weight loss can reverse and cure  diabetes in the early stages.   ============================================================ ============================================================  -  Vitamin D = 41 is very Low   - Vitamin D goal is between 70-100.   - Please Restart  Vitamin D 5,000 units /daily   -  It is very important as a natural anti-inflammatory and helping the  immune system protect against viral infections, like the Covid-19    helping hair, skin, and nails, as well as reducing stroke and  heart attack risk.   - It helps your bones and helps with mood.  - It also decreases numerous cancer risks so please  take it as directed.   - Low Vit D is associated with a 200-300% higher risk for  CANCER   and 200-300% higher risk for HEART   ATTACK  &  STROKE.    - It is also associated with higher death rate at younger ages,   autoimmune diseases like Rheumatoid arthritis, Lupus,  Multiple Sclerosis.     - Also many other serious conditions, like depression, Alzheimer's  Dementia, infertility,  muscle aches, fatigue, fibromyalgia   - just to name a few. ============================================================ ============================================================  - All Else - CBC - Kidneys - Electrolytes - Liver - Magnesium & Thyroid    - all  Normal / OK ============================================================ ============================================================

## 2021-05-08 DIAGNOSIS — N3000 Acute cystitis without hematuria: Secondary | ICD-10-CM | POA: Diagnosis not present

## 2021-05-09 LAB — URINE CULTURE
MICRO NUMBER:: 12886874
Result:: NO GROWTH
SPECIMEN QUALITY:: ADEQUATE

## 2021-05-10 NOTE — Progress Notes (Signed)
============================================================ °============================================================ ° °-    U/C - Negative & OK   ============================================================ ============================================================

## 2021-05-13 ENCOUNTER — Encounter: Payer: Self-pay | Admitting: Internal Medicine

## 2021-05-20 DIAGNOSIS — K52831 Collagenous colitis: Secondary | ICD-10-CM | POA: Diagnosis not present

## 2021-06-19 IMAGING — MG DIGITAL SCREENING BILATERAL MAMMOGRAM WITH TOMO AND CAD
6 of 10 series · 6 of 30 positions shown · non-contrast
Comparison: Previous exam(s).

CLINICAL DATA: Screening.

EXAM:
DIGITAL SCREENING BILATERAL MAMMOGRAM WITH TOMO AND CAD

[L MLO synth-2D]
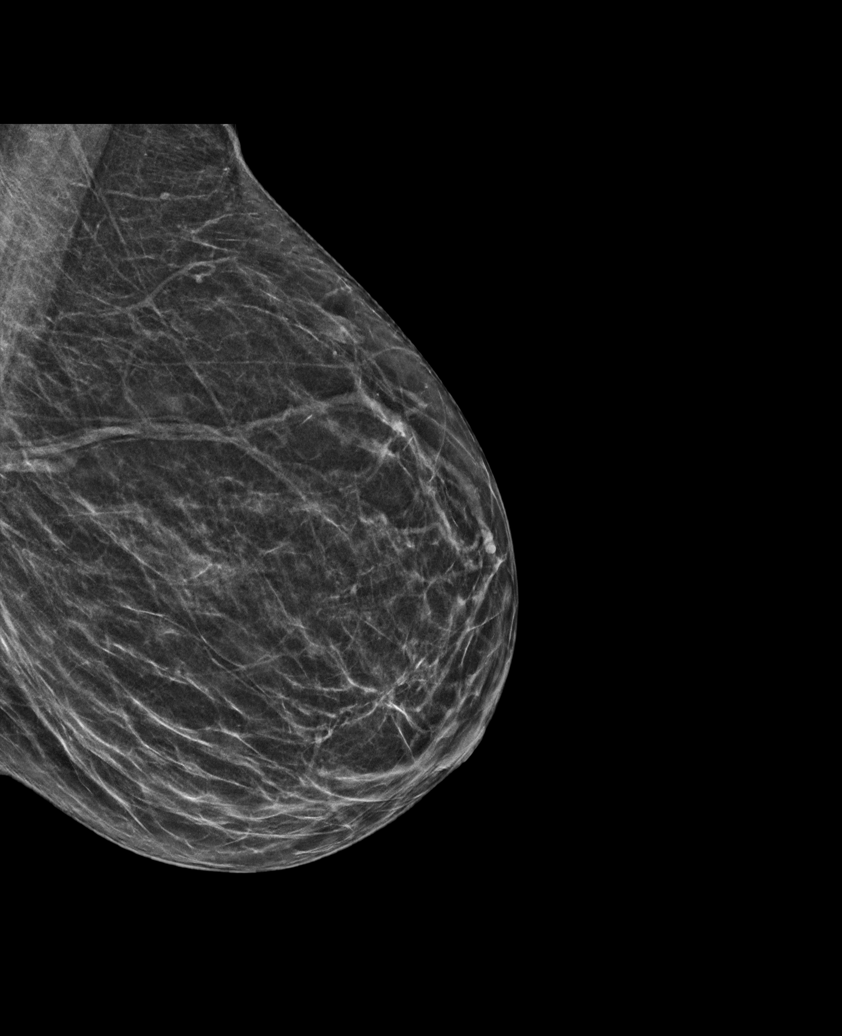

[R CC synth-2D (1 of 2)]
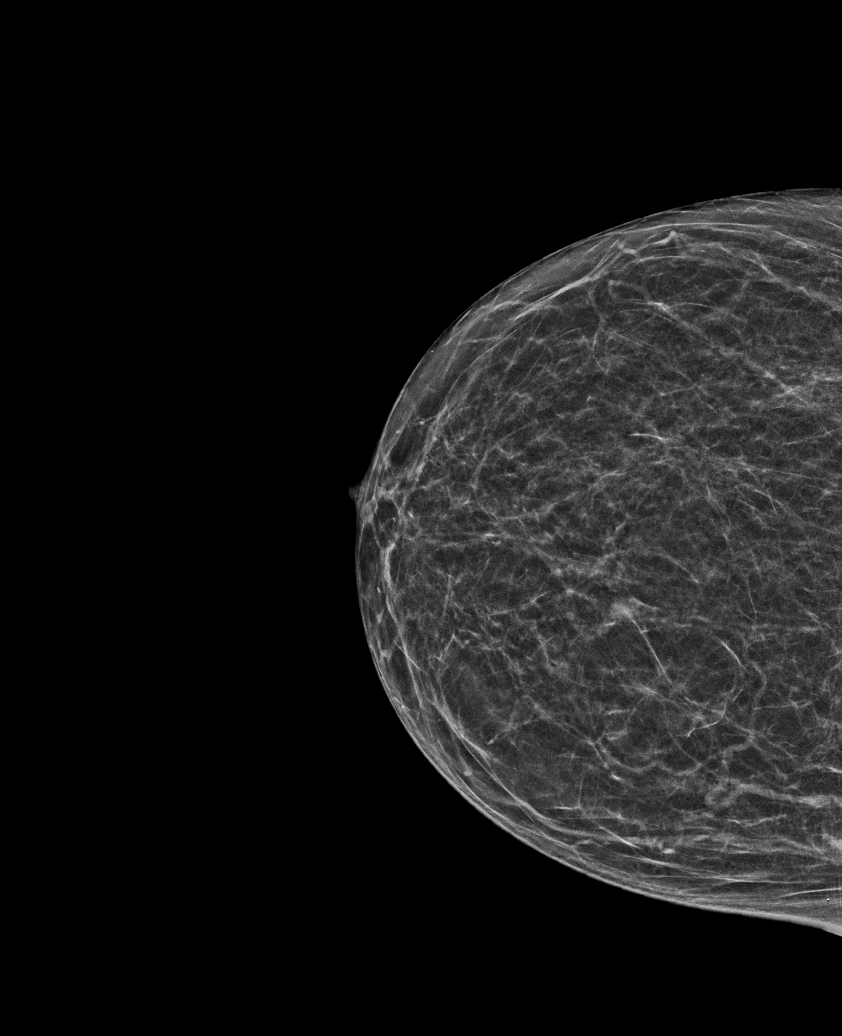

[R CC synth-2D (2 of 2)]
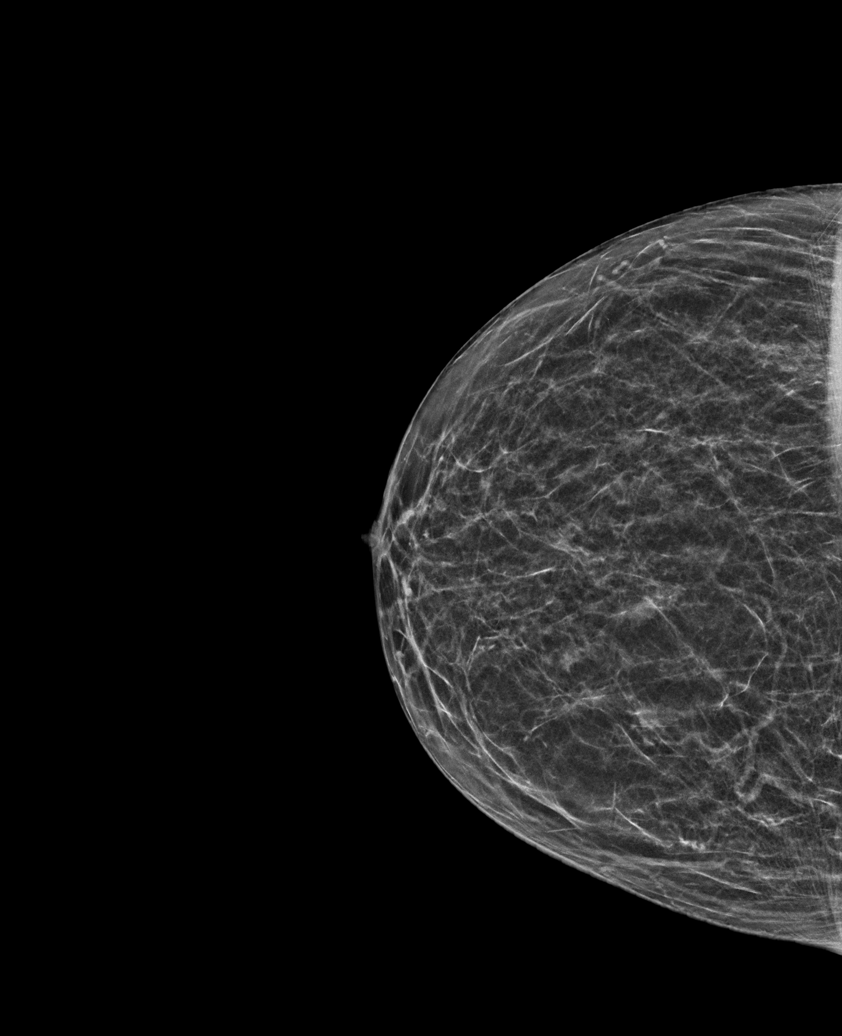

[L CC synth-2D]
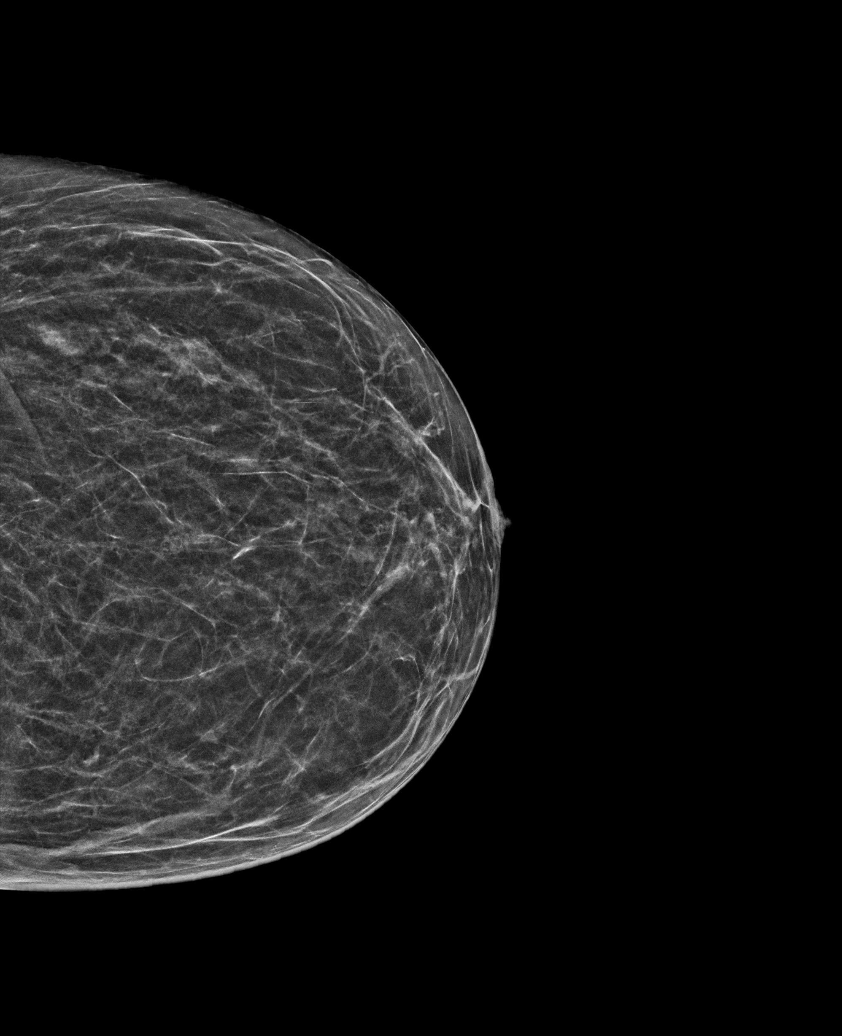

[R MLO synth-2D]
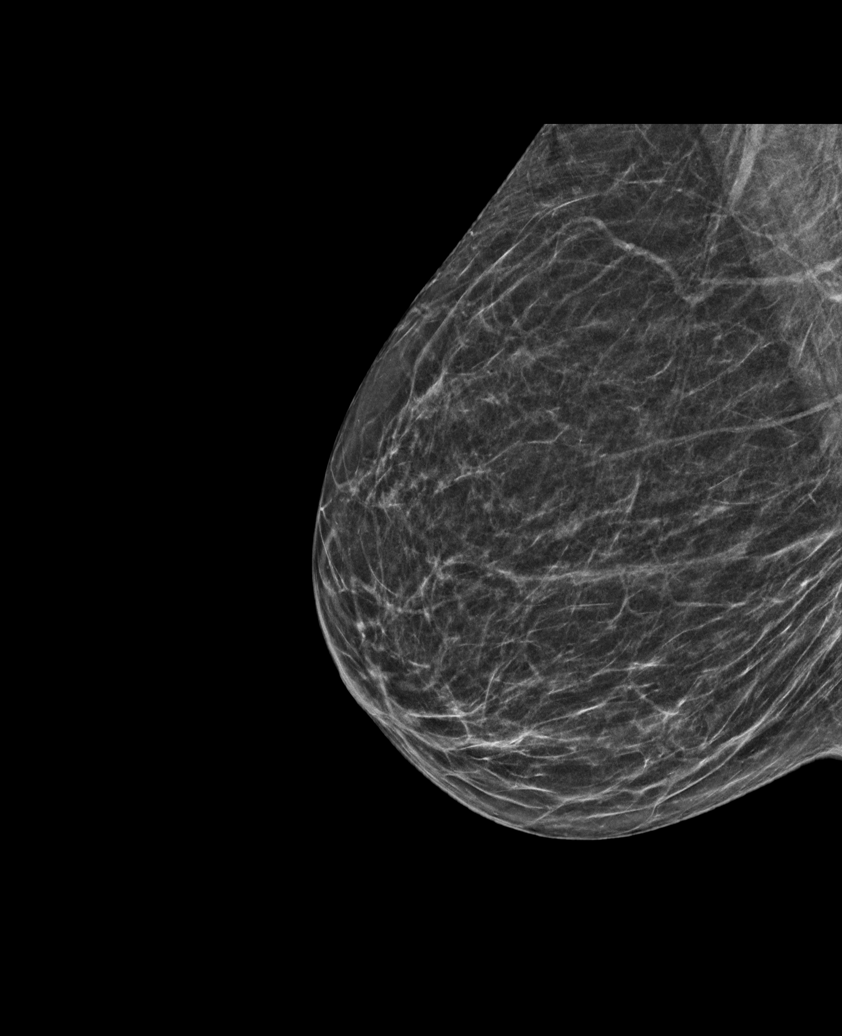

[L CC tomo · tomo slice 25/49.0]
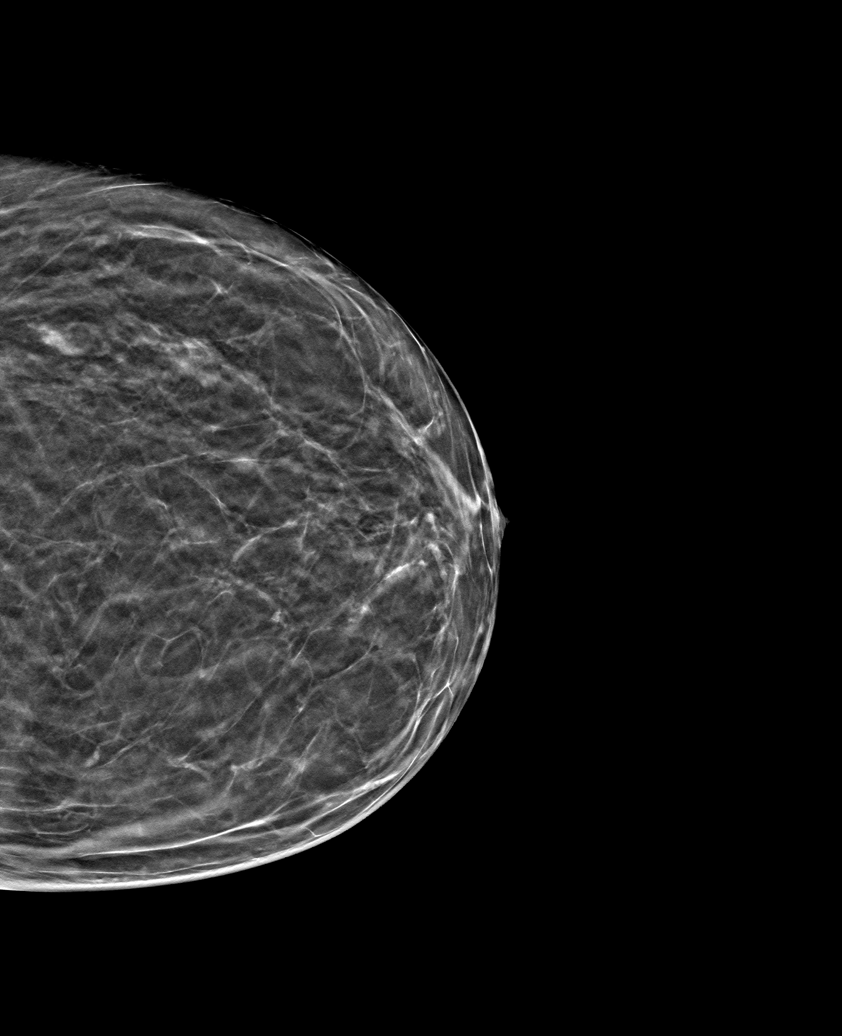

[6 of 30 positions shown; findings below may reference images not displayed]

ACR Breast Density Category b: There are scattered areas of
fibroglandular density.
FINDINGS: There are no findings suspicious for malignancy. Images were
processed with CAD.
IMPRESSION: No mammographic evidence of malignancy. A result letter of this
screening mammogram will be mailed directly to the patient.

RECOMMENDATION:
Screening mammogram in one year. (Code:[id number])

BI-RADS CATEGORY  1: Negative.

## 2021-08-06 ENCOUNTER — Other Ambulatory Visit: Payer: Self-pay | Admitting: Internal Medicine

## 2021-08-06 DIAGNOSIS — E782 Mixed hyperlipidemia: Secondary | ICD-10-CM

## 2021-08-20 ENCOUNTER — Ambulatory Visit: Payer: Medicare HMO | Admitting: Adult Health

## 2021-09-04 ENCOUNTER — Encounter: Payer: Self-pay | Admitting: Adult Health

## 2021-09-04 ENCOUNTER — Ambulatory Visit (INDEPENDENT_AMBULATORY_CARE_PROVIDER_SITE_OTHER): Payer: Medicare HMO | Admitting: Adult Health

## 2021-09-04 VITALS — BP 130/72 | HR 62 | Temp 96.9°F | Wt 138.0 lb

## 2021-09-04 DIAGNOSIS — E559 Vitamin D deficiency, unspecified: Secondary | ICD-10-CM

## 2021-09-04 DIAGNOSIS — Z6822 Body mass index (BMI) 22.0-22.9, adult: Secondary | ICD-10-CM | POA: Diagnosis not present

## 2021-09-04 DIAGNOSIS — E782 Mixed hyperlipidemia: Secondary | ICD-10-CM | POA: Diagnosis not present

## 2021-09-04 DIAGNOSIS — D649 Anemia, unspecified: Secondary | ICD-10-CM | POA: Diagnosis not present

## 2021-09-04 DIAGNOSIS — G720 Drug-induced myopathy: Secondary | ICD-10-CM

## 2021-09-04 DIAGNOSIS — Z79899 Other long term (current) drug therapy: Secondary | ICD-10-CM | POA: Diagnosis not present

## 2021-09-04 DIAGNOSIS — M87052 Idiopathic aseptic necrosis of left femur: Secondary | ICD-10-CM

## 2021-09-04 DIAGNOSIS — R7309 Other abnormal glucose: Secondary | ICD-10-CM

## 2021-09-04 DIAGNOSIS — R030 Elevated blood-pressure reading, without diagnosis of hypertension: Secondary | ICD-10-CM | POA: Diagnosis not present

## 2021-09-04 NOTE — Patient Instructions (Signed)
Suggest reviewing calcium supplement dose -  ? ?Recommend sticking to 250-300 mg twice daily to avoid serum peaks and risk of vascular/heart valve calcifications ? ? ? ?Calcium Content in Foods ?Calcium is the most abundant mineral in the body. Most of the body's calcium supply is stored in bones and teeth. Calcium helps many parts of the body function normally, including: ?Blood and blood vessels. ?Nerves. ?Hormones. ?Muscles. ?Bones and teeth. ?When your calcium stores are low, you may be at risk for low bone mass, bone loss, and broken bones (fractures). When you get enough calcium, it helps to support strong bones and teeth throughout your life. ?Calcium is especially important for: ?Children during growth spurts. ?Girls during adolescence. ?Women who are pregnant or breastfeeding. ?Women after their menstrual cycle stops (postmenopause). ?Women whose menstrual cycle has stopped due to anorexia nervosa or regular intense exercise. ?People who cannot eat or digest dairy products. ?Vegans. ?Recommended daily amounts of calcium: ?Women (ages 37 to 58): 1,000 mg per day. ?Women (ages 50 and older): 1,200 mg per day. ?Men (ages 79 to 65): 1,000 mg per day. ?Men (ages 55 and older): 1,200 mg per day. ?Women (ages 5 to 31): 1,300 mg per day. ?Men (ages 37 to 95): 1,300 mg per day. ?General information ?Eat foods that are high in calcium. Try to get most of your calcium from food. ?Some people may benefit from taking calcium supplements. Check with your health care provider or diet and nutrition specialist (dietitian) before starting any calcium supplements. Calcium supplements may interact with certain medicines. Too much calcium may cause other health problems, such as constipation and kidney stones. ?For the body to absorb calcium, it needs vitamin D. Sources of vitamin D include: ?Skin exposure to direct sunlight. ?Foods, such as egg yolks, liver, mushrooms, saltwater fish, and fortified milk. ?Vitamin D supplements.  Check with your health care provider or dietitian before starting any vitamin D supplements. ?What foods are high in calcium? ? ?Foods that are high in calcium contain more than 100 milligrams per serving. ?Fruits ?Fortified orange juice or other fruit juice, 300 mg per 8 oz serving. ?Vegetables ?Collard greens, 360 mg per 8 oz serving. ?Kale, 100 mg per 8 oz serving. ?Bok choy, 160 mg per 8 oz serving. ?Grains ?Fortified ready-to-eat cereals, 100 to 1,000 mg per 8 oz serving. ?Fortified frozen waffles, 200 mg in 2 waffles. ?Oatmeal, 140 mg in 1 cup. ?Meats and other proteins ?Sardines, canned with bones, 325 mg per 3 oz serving. ?Salmon, canned with bones, 180 mg per 3 oz serving. ?Canned shrimp, 125 mg per 3 oz serving. ?Baked beans, 160 mg per 4 oz serving. ?Tofu, firm, made with calcium sulfate, 253 mg per 4 oz serving. ?Dairy ?Yogurt, plain, low-fat, 310 mg per 6 oz serving. ?Nonfat milk, 300 mg per 8 oz serving. ?American cheese, 195 mg per 1 oz serving. ?Cheddar cheese, 205 mg per 1 oz serving. ?Cottage cheese 2%, 105 mg per 4 oz serving. ?Fortified soy, rice, or almond milk, 300 mg per 8 oz serving. ?Mozzarella, part skim, 210 mg per 1 oz serving. ?The items listed above may not be a complete list of foods high in calcium. Actual amounts of calcium may be different depending on processing. Contact a dietitian for more information. ?What foods are lower in calcium? ?Foods that are lower in calcium contain 50 mg or less per serving. ?Fruits ?Apple, about 6 mg. ?Banana, about 12 mg. ?Vegetables ?Lettuce, 19 mg per 2 oz serving. ?Tomato, about  11 mg. ?Grains ?Rice, 4 mg per 6 oz serving. ?Boiled potatoes, 14 mg per 8 oz serving. ?White bread, 6 mg per slice. ?Meats and other proteins ?Egg, 27 mg per 2 oz serving. ?Red meat, 7 mg per 4 oz serving. ?Chicken, 17 mg per 4 oz serving. ?Fish, cod, or trout, 20 mg per 4 oz serving. ?Dairy ?Cream cheese, regular, 14 mg per 1 Tbsp serving. ?Brie cheese, 50 mg per 1 oz  serving. ?Parmesan cheese, 70 mg per 1 Tbsp serving. ?The items listed above may not be a complete list of foods lower in calcium. Actual amounts of calcium may be different depending on processing. Contact a dietitian for more information. ?Summary ?Calcium is an important mineral in the body because it affects many functions. Getting enough calcium helps support strong bones and teeth throughout your life. ?Try to get most of your calcium from food. ?Calcium supplements may interact with certain medicines. Check with your health care provider or dietitian before starting any calcium supplements. ?This information is not intended to replace advice given to you by your health care provider. Make sure you discuss any questions you have with your health care provider. ?Document Revised: 08/03/2019 Document Reviewed: 08/03/2019 ?Elsevier Patient Education ? [place]. ? ?

## 2021-09-04 NOTE — Progress Notes (Signed)
Patient ID: Madison Gonzalez, female   DOB: 09/05/1940, 81 y.o.   MRN: [id number] ? ?3 MONTH FOLLOW UP ? ?Assessment:  ? ? ?Irritable bowel syndrome, unspecified type ?Controlled at this time without flare, improved on align probiotic  ? ?Vitamin D deficiency ?At goal at recent check; continue to recommend supplementation for goal of 60 ? ?Abnormal glucose - Prediabetes ?Discussed disease and risks ?Discussed diet/exercise, weight management  ?Check A1C q72m CMP for glucose otherwise ? ?Hyperlipidemia ?Newlyl back on zetia, holding gemfibrozil - would like to stop gemfibrozil if possible ?LDL goal <100 ?Hx of statin intolerance, declines further ?Continue low cholesterol diet and exercise.  ?Check lipid panel.  ? ?Elevated BP w/o Dx HTN ?Monitor blood pressure at home; call if consistently over 130/80 ?Continue DASH diet.   ?Reminder to go to the ER if any CP, SOB, nausea, dizziness, severe HA, changes vision/speech, left arm numbness and tingling and jaw pain. ? ?Body mass index (BMI) of 23 ?Continue to recommend diet heavy in fruits and veggies and low in animal meats, cheeses, and dairy products, appropriate calorie intake ?Discuss exercise recommendations routinely ?Continue to monitor weight at each visit  ? ?Avascular necrosis of hip, left ([place] ?Has seen ortho; monitoring only; hip replacement if progressive/limiting pain ?Denies any sx at this time  ? ?Orders Placed This Encounter  ?Procedures  ? CBC with Differential/Platelet  ? COMPLETE METABOLIC PANEL WITH GFR  ? Lipid panel  ? TSH  ? ? ?Over 30 minutes of exam, counseling, chart review, and critical decision making was performed ? ?Future Appointments  ?Date Time Provider DPost ?11/25/2021  9:30 AM MDemetra ShinerDTownsend Roger NP [facility] None  ?05/08/2022  3:00 PM MUnk Pinto MD [facility] None  ? ? ?Subjective:  ? ?Madison VANALSTINEis a 81y.o. female who presents for 3 month follow up on hypertension, glucose management, hyperlipidemia,  vitamin D def.  ? ?[place], [place], just resealed her driveway, stays very active on her land.  ? ?She has L hip avascular necrosis per xray 12/2018 obtained for pelvic pain, saw Dr. RMayer Camelwho recommended monitoring, if progressive/severe pain would have hip replacement. She also has osteoporosis with alendronate intolerance and was referred to Dr. GCruzita Ledererfor further management, discussed prolia/teriparatide but ultimately declined. She is taking calcium, vitamin D.  ? ?BMI is Body mass index is 22.96 kg/m?., she has been working on diet and exercise, very active around her yard, lives on [place] acres which she cares for by herself. Started a yoga/ Tai Yoga course and enjoying this.  ?Wt Readings from Last 3 Encounters:  ?09/04/21 138 lb (62.6 kg)  ?05/06/21 137 lb 12.8 oz (62.5 kg)  ?10/31/20 132 lb (59.9 kg)  ? ?Her blood pressure has been controlled at home, today their BP is BP: 130/72 ?She does not workout but very active. She denies chest pain, shortness of breath, dizziness.  ? ?She is on cholesterol medication (zetia, lopid - was off of meds in Jan, restarted zetia, off of gemfibrozil) and denies myalgias. Hx of myalgia with statins. Declines further, fairly benign hx (father than congenital heart issues). Her cholesterol is at goal. The cholesterol last visit was:   ?Lab Results  ?Component Value Date  ? CHOL 204 (H) 05/06/2021  ? HDL 55 05/06/2021  ? LDLCALC 131 (H) 05/06/2021  ? TRIG 83 05/06/2021  ? CHOLHDL 3.7 05/06/2021  ? ?She has been working on diet and  exercise for glucose management, and denies foot ulcerations, hyperglycemia, hypoglycemia , increased appetite, nausea, paresthesia of the feet, polydipsia, polyuria, visual disturbances, vomiting and weight loss. Last A1C in the office was:  ?Lab Results  ?Component Value Date  ? HGBA1C 5.8 (H) 05/06/2021  ? ?Last GFR ?Lab Results  ?Component Value Date  ? EGFR 90 05/06/2021  ? ?Patient is on Vitamin D  supplement. ?Lab Results  ?Component Value Date  ? VD25OH 41 05/06/2021  ?   ? ?Medication Review ?Current Outpatient Medications on File Prior to Visit  ?Medication Sig Dispense Refill  ? aspirin EC 81 MG tablet Take 81 mg by mouth daily. Swallow whole.    ? Calcium Carbonate-Vit D-Min (CALCIUM 1200 PO) Take by mouth.    ? ezetimibe (ZETIA) 10 MG tablet Take 10 mg by mouth daily.    ? gemfibrozil (LOPID) 600 MG tablet Take 600 mg by mouth 2 (two) times daily before a meal.    ? Multiple Vitamin (MULTIVITAMIN) tablet Take 1 tablet by mouth daily.    ? Probiotic Product (ALIGN PO) Take by mouth daily.    ? VITAMIN D PO Take by mouth. Take 5000 IU QOD and 10,000 IU QOD    ? ?No current facility-administered medications on file prior to visit.  ? ? ?Current Problems (verified) ?Patient Active Problem List  ? Diagnosis Date Noted  ? Positive colorectal cancer screening using Cologuard test 12/05/2020  ? Statin myopathy 04/19/2020  ? Aseptic necrosis of bone of hip, left ([place]) 10/03/2019  ? FHx: heart disease 02/23/2018  ? BMI 22.0-22.9, adult 03/31/2015  ? Abnormal glucose (prediabetes) 03/29/2015  ? Elevated BP w/o Dx HTN 06/29/2013  ? Hyperlipidemia, mixed 06/29/2013  ? Vitamin D deficiency 06/29/2013  ? IBS (irritable bowel syndrome)   ? Psoriasis   ? Osteoporosis   ? History of ITP   ? ? ?Past Surgical History:  ?Procedure Laterality Date  ? ABDOMINAL HYSTERECTOMY    ? APPENDECTOMY    ? BREAST SURGERY Right   ? Biospy, benign  ? EYE SURGERY Bilateral   ? Cataracts  ? EYE SURGERY    ? Lense implants  ? TUBAL LIGATION    ? ?Family History  ?Problem Relation Age of Onset  ? AAA (abdominal aortic aneurysm) Mother   ? Hypertension Mother   ? Hypertension Father   ? Heart disease Father   ? Hyperlipidemia Father   ? Cancer Maternal Aunt   ?     uterine  ? ?Social History  ? ?Tobacco Use  ? Smoking status: Never  ? Smokeless tobacco: Never  ?Substance Use Topics  ? Alcohol use: Yes  ?  Comment: occasional wine 1-2 drinks   ? Drug use: No  ? ? ?Review of Systems  ?Constitutional:  Negative for malaise/fatigue and weight loss.  ?HENT:  Negative for hearing loss and tinnitus.   ?Eyes:  Negative for blurred vision and double vision.  ?Respiratory:  Negative for cough, shortness of breath and wheezing.   ?Cardiovascular:  Negative for chest pain, palpitations, orthopnea, claudication and leg swelling.  ?Gastrointestinal:  Negative for abdominal pain, blood in stool, constipation, diarrhea, heartburn, melena, nausea and vomiting.  ?Genitourinary: Negative.   ?Musculoskeletal:  Negative for joint pain and myalgias.  ?Skin:  Negative for rash.  ?Neurological:  Negative for dizziness, tingling, sensory change, weakness and headaches.  ?Endo/Heme/Allergies:  Negative for polydipsia.  ?Psychiatric/Behavioral: Negative.    ?All other systems reviewed and are negative. ? ? ? ? ?  Objective:  ? ?Today's Vitals  ? 09/04/21 1049  ?BP: 130/72  ?Pulse: 62  ?Temp: (!) 96.9 ?F (36.1 ?C)  ?SpO2: 98%  ?Weight: 138 lb (62.6 kg)  ? ? ? ?Body mass index is 22.96 kg/m?. ? ?General appearance: alert, no distress, WD/WN,  female ?HEENT: normocephalic, sclerae anicteric, TMs pearly, nares patent, no discharge or erythema, pharynx normal ?Oral cavity: MMM, no lesions ?Neck: supple, no lymphadenopathy, no thyromegaly, no masses ?Heart: RRR, normal S1, S2, no murmurs ?Lungs: CTA bilaterally, no wheezes, rhonchi, or rales ?Abdomen: +bs, soft, non tender, non distended, no masses, no hepatomegaly, no splenomegaly ?Musculoskeletal: nontender, no swelling, no obvious deformity ?Extremities: no edema, no cyanosis, no clubbing ?Pulses: 2+ symmetric, upper and lower extremities, normal cap refill ?Neurological: alert, oriented x 3, CN2-12 intact, strength normal upper extremities and lower extremities, sensation normal throughout, DTRs 2+ throughout, no cerebellar signs, gait normal ?Psychiatric: normal affect, behavior normal, pleasant  ? ?Izora Ribas, NP   09/04/2021   ?

## 2021-09-05 ENCOUNTER — Other Ambulatory Visit: Payer: Self-pay | Admitting: Adult Health

## 2021-09-05 MED ORDER — GEMFIBROZIL 600 MG PO TABS: 600.0000 mg | ORAL_TABLET | Freq: Two times a day (BID) | ORAL | 3 refills | Status: DC

## 2021-09-06 ENCOUNTER — Other Ambulatory Visit: Payer: Self-pay | Admitting: Adult Health

## 2021-09-06 DIAGNOSIS — D649 Anemia, unspecified: Secondary | ICD-10-CM

## 2021-09-06 LAB — COMPLETE METABOLIC PANEL WITH GFR
AG Ratio: 1.5 (calc) (ref 1.0–2.5)
ALT: 18 U/L (ref 6–29)
AST: 19 U/L (ref 10–35)
Albumin: 3.8 g/dL (ref 3.6–5.1)
Alkaline phosphatase (APISO): 85 U/L (ref 37–153)
BUN: 17 mg/dL (ref 7–25)
CO2: 27 mmol/L (ref 20–32)
Calcium: 9.2 mg/dL (ref 8.6–10.4)
Chloride: 108 mmol/L (ref 98–110)
Creat: 0.7 mg/dL (ref 0.60–0.95)
Globulin: 2.6 g/dL (calc) (ref 1.9–3.7)
Glucose, Bld: 86 mg/dL (ref 65–99)
Potassium: 4 mmol/L (ref 3.5–5.3)
Sodium: 143 mmol/L (ref 135–146)
Total Bilirubin: 0.6 mg/dL (ref 0.2–1.2)
Total Protein: 6.4 g/dL (ref 6.1–8.1)
eGFR: 86 mL/min/{1.73_m2} (ref >=60)

## 2021-09-06 LAB — LIPID PANEL
Cholesterol: 185 mg/dL (ref <200)
HDL: 51 mg/dL (ref >=50)
LDL Cholesterol (Calc): 124 mg/dL (calc) — ABNORMAL HIGH
Non-HDL Cholesterol (Calc): 134 mg/dL (calc) — ABNORMAL HIGH (ref <130)
Total CHOL/HDL Ratio: 3.6 (calc) (ref <5.0)
Triglycerides: 34 mg/dL (ref <150)

## 2021-09-06 LAB — CBC WITH DIFFERENTIAL/PLATELET
Absolute Monocytes: 389 cells/uL (ref 200–950)
Basophils Absolute: 59 cells/uL (ref 0–200)
Basophils Relative: 1.1 %
Eosinophils Absolute: 162 cells/uL (ref 15–500)
Eosinophils Relative: 3 %
HCT: 35.2 % (ref 35.0–45.0)
Hemoglobin: 11.6 g/dL — ABNORMAL LOW (ref 11.7–15.5)
Lymphs Abs: 1588 cells/uL (ref 850–3900)
MCH: 30.5 pg (ref 27.0–33.0)
MCHC: 33 g/dL (ref 32.0–36.0)
MCV: 92.6 fL (ref 80.0–100.0)
MPV: 11.3 fL (ref 7.5–12.5)
Monocytes Relative: 7.2 %
Neutro Abs: 3202 cells/uL (ref 1500–7800)
Neutrophils Relative %: 59.3 %
Platelets: 205 10*3/uL (ref 140–400)
RBC: 3.8 10*6/uL (ref 3.80–5.10)
RDW: 13.3 % (ref 11.0–15.0)
Total Lymphocyte: 29.4 %
WBC: 5.4 10*3/uL (ref 3.8–10.8)

## 2021-09-06 LAB — TEST AUTHORIZATION

## 2021-09-06 LAB — IRON,TIBC AND FERRITIN PANEL
%SAT: 26 % (calc) (ref 16–45)
Ferritin: 19 ng/mL (ref 16–288)
Iron: 80 ug/dL (ref 45–160)
TIBC: 306 mcg/dL (calc) (ref 250–450)

## 2021-09-06 LAB — TSH: TSH: 1.31 mIU/L (ref 0.40–4.50)

## 2021-09-09 DIAGNOSIS — Z961 Presence of intraocular lens: Secondary | ICD-10-CM | POA: Diagnosis not present

## 2021-09-09 DIAGNOSIS — H524 Presbyopia: Secondary | ICD-10-CM | POA: Diagnosis not present

## 2021-09-09 DIAGNOSIS — H35373 Puckering of macula, bilateral: Secondary | ICD-10-CM | POA: Diagnosis not present

## 2021-09-09 DIAGNOSIS — H04123 Dry eye syndrome of bilateral lacrimal glands: Secondary | ICD-10-CM | POA: Diagnosis not present

## 2021-10-04 ENCOUNTER — Other Ambulatory Visit: Payer: Medicare HMO

## 2021-10-04 DIAGNOSIS — D649 Anemia, unspecified: Secondary | ICD-10-CM | POA: Diagnosis not present

## 2021-10-05 ENCOUNTER — Encounter: Payer: Self-pay | Admitting: Adult Health

## 2021-10-05 DIAGNOSIS — E538 Deficiency of other specified B group vitamins: Secondary | ICD-10-CM | POA: Insufficient documentation

## 2021-10-08 LAB — RETICULOCYTES
ABS Retic: 47400 cells/uL (ref 20000–80000)
Retic Ct Pct: 1.2 %

## 2021-10-08 LAB — FOLATE RBC: RBC Folate: 622 ng/mL RBC (ref >280)

## 2021-10-08 LAB — VITAMIN B12: Vitamin B-12: 344 pg/mL (ref 200–1100)

## 2021-10-24 ENCOUNTER — Other Ambulatory Visit: Payer: Self-pay

## 2021-10-24 DIAGNOSIS — D649 Anemia, unspecified: Secondary | ICD-10-CM

## 2021-10-24 LAB — POC HEMOCCULT BLD/STL (HOME/3-CARD/SCREEN)
Card #2 Fecal Occult Blod, POC: NEGATIVE
Card #3 Fecal Occult Blood, POC: NEGATIVE
Fecal Occult Blood, POC: NEGATIVE

## 2021-10-28 DIAGNOSIS — Z1212 Encounter for screening for malignant neoplasm of rectum: Secondary | ICD-10-CM | POA: Diagnosis not present

## 2021-10-28 DIAGNOSIS — Z1211 Encounter for screening for malignant neoplasm of colon: Secondary | ICD-10-CM | POA: Diagnosis not present

## 2021-10-31 ENCOUNTER — Ambulatory Visit: Payer: Medicare HMO | Admitting: Adult Health

## 2021-11-12 ENCOUNTER — Telehealth: Payer: Self-pay | Admitting: Nurse Practitioner

## 2021-11-12 MED ORDER — EZETIMIBE 10 MG PO TABS: 10.0000 mg | ORAL_TABLET | Freq: Every day | ORAL | 0 refills | Status: DC

## 2021-11-12 NOTE — Telephone Encounter (Signed)
Patient is requesting a refill on generic zetia. She would like it to be sent to [facility] on [address].

## 2021-11-12 NOTE — Addendum Note (Signed)
Addended by: Chancy Hurter on: 11/12/2021 03:45 PM   Modules accepted: Orders

## 2021-11-13 DIAGNOSIS — D225 Melanocytic nevi of trunk: Secondary | ICD-10-CM | POA: Diagnosis not present

## 2021-11-13 DIAGNOSIS — D692 Other nonthrombocytopenic purpura: Secondary | ICD-10-CM | POA: Diagnosis not present

## 2021-11-13 DIAGNOSIS — L820 Inflamed seborrheic keratosis: Secondary | ICD-10-CM | POA: Diagnosis not present

## 2021-11-13 DIAGNOSIS — D2262 Melanocytic nevi of left upper limb, including shoulder: Secondary | ICD-10-CM | POA: Diagnosis not present

## 2021-11-13 DIAGNOSIS — L821 Other seborrheic keratosis: Secondary | ICD-10-CM | POA: Diagnosis not present

## 2021-11-13 DIAGNOSIS — L400 Psoriasis vulgaris: Secondary | ICD-10-CM | POA: Diagnosis not present

## 2021-11-13 DIAGNOSIS — D2261 Melanocytic nevi of right upper limb, including shoulder: Secondary | ICD-10-CM | POA: Diagnosis not present

## 2021-11-25 ENCOUNTER — Ambulatory Visit: Payer: Medicare HMO | Admitting: Nurse Practitioner

## 2021-12-12 ENCOUNTER — Ambulatory Visit: Payer: Medicare HMO | Admitting: Nurse Practitioner

## 2021-12-16 ENCOUNTER — Other Ambulatory Visit: Payer: Self-pay | Admitting: Adult Health

## 2021-12-16 DIAGNOSIS — Z1231 Encounter for screening mammogram for malignant neoplasm of breast: Secondary | ICD-10-CM

## 2021-12-27 NOTE — Progress Notes (Unsigned)
Patient ID: Madison Gonzalez, female   DOB: January 19, 1941, 81 y.o.   MRN: [id number]  MEDICARE ANNUAL WELLNESS VISIT AND FOLLOW UP  Assessment:    Encounter for Medicare annual wellness exam Due annually  Health maintenance reviewed  Irritable bowel syndrome, unspecified type Controlled at this time without flare, takes imodium PRN   Psoriasis -  better at this time, topical PRN  Vitamin D deficiency At goal at recent check; continue to recommend supplementation for goal of 70-100  Abnormal glucose Recent A1Cs at goal Discussed diet/exercise, weight management  Defer A1C; check CMP  Hyperlipidemia Continue medications Continue low cholesterol diet and exercise.  Check lipid panel.   History of ITP Monitor CBC  Elevated BP w/o Dx HTN Monitor blood pressure at home; call if consistently over 130/80 Continue DASH diet.   Reminder to go to the ER if any CP, SOB, nausea, dizziness, severe HA, changes vision/speech, left arm numbness and tingling and jaw pain.  Body mass index (BMI) of 22 Continue to recommend diet heavy in fruits and veggies and low in animal meats, cheeses, and dairy products, appropriate calorie intake Discuss exercise recommendations routinely Continue to monitor weight at each visit   Osteopenia, unspecified location Weight bearing exercises, high calcium diet, supplement vitamin D Repeat DEXA with MGM in 2023, reviewed lifestyle in depth, importance of preventing falls and lifestyle changes to improve or maintain bone density  Fosamax intolerance, Dr. Cruzita Lederer is following, declined meds Long discussion about lifestyle interventions today  Avascular necrosis of hip, left ([place]) Has seen ortho; monitoring only; hip replacement if progressive/limiting pain   No orders of the defined types were placed in this encounter.   Over 30 minutes of exam, counseling, chart review, and critical decision making was performed  Future Appointments  Date Time  Provider [place]  12/30/2021 10:00 AM Alycia Rossetti, NP [facility] None  01/22/2022  8:40 AM [facility] MM 2 [facility]  05/08/2022  3:00 PM Unk Pinto, MD [facility] None  11/06/2022 11:00 AM Alycia Rossetti, NP [facility] None     Plan:   During the course of the visit the patient was educated and counseled about appropriate screening and preventive services including:   Pneumococcal vaccine  Influenza vaccine Td vaccine Prevnar 13 Screening electrocardiogram Screening mammography Bone densitometry screening Colorectal cancer screening Diabetes screening Glaucoma screening Nutrition counseling  Advanced directives: given info/requested copies   Subjective:   Madison Gonzalez is a 81 y.o. female who presents for Medicare Annual Wellness Visit and 3 month follow up on hypertension, glucose management, hyperlipidemia, vitamin D def.   [place] with [place], [place], just resealed her driveway, stays very active on her land.   She has L hip avascular necrosis per xray 12/2018 obtained for pelvic pain, saw Dr. Mayer Camel who recommended monitoring, if progressive/severe pain would have hip replacement. She also has osteoporosis with alendronate intolerance and was referred to Dr. Cruzita Lederer for further management, discussed prolia/teriparatide but ultimately declined. She is taking calcium, vitamin D.   Psoriasis - mild, does topical prn. Follows with derm.   BMI is There is no height or weight on file to calculate BMI., she has been working on diet and exercise, very active around her yard, lives on 20 acres which she cares for by herself. Does ride stationary bike occasionally.  Wt Readings from Last 3 Encounters:  09/04/21 138 lb (62.6 kg)  05/06/21 137 lb 12.8 oz (62.5 kg)  10/31/20 132 lb (59.9 kg)   Her blood pressure has been controlled at home, today their BP is   She does not workout but very active. She denies chest pain,  shortness of breath, dizziness.   She is on cholesterol medication (zetia, lopid) and denies myalgias. Hx of fatigue with statins. Her cholesterol is at goal. The cholesterol last visit was:   Lab Results  Component Value Date   CHOL 185 09/04/2021   HDL 51 09/04/2021   LDLCALC 124 (H) 09/04/2021   TRIG 34 09/04/2021   CHOLHDL 3.6 09/04/2021   She has been working on diet and exercise for glucose management, and denies foot ulcerations, hyperglycemia, hypoglycemia , increased appetite, nausea, paresthesia of the feet, polydipsia, polyuria, visual disturbances, vomiting and weight loss. Last A1C in the office was:  Lab Results  Component Value Date   HGBA1C 5.8 (H) 05/06/2021   Last GFR Lab Results  Component Value Date   GFRNONAA 87 07/26/2020   Patient is on Vitamin D supplement. Lab Results  Component Value Date   VD25OH 41 05/06/2021      Medication Review Current Outpatient Medications on File Prior to Visit  Medication Sig Dispense Refill   aspirin EC 81 MG tablet Take 81 mg by mouth daily. Swallow whole.     Calcium Carbonate-Vit D-Min (CALCIUM 1200 PO) Take by mouth.     ezetimibe (ZETIA) 10 MG tablet Take 1 tablet (10 mg total) by mouth daily. 90 tablet 0   gemfibrozil (LOPID) 600 MG tablet Take 1 tablet (600 mg total) by mouth 2 (two) times daily before a meal. 180 tablet 3   Multiple Vitamin (MULTIVITAMIN) tablet Take 1 tablet by mouth daily.     Probiotic Product (ALIGN PO) Take by mouth daily.     VITAMIN D PO Take by mouth. Take 5000 IU QOD and 10,000 IU QOD     No current facility-administered medications on file prior to visit.    Current Problems (verified) Patient Active Problem List   Diagnosis Date Noted   B12 deficiency 10/05/2021   Positive colorectal cancer screening using Cologuard test 12/05/2020   Statin myopathy 04/19/2020   Aseptic necrosis of bone of hip, left ([place]) 10/03/2019   FHx: heart disease 02/23/2018   BMI 22.0-22.9, adult  03/31/2015   Abnormal glucose (prediabetes) 03/29/2015   Elevated BP w/o Dx HTN 06/29/2013   Hyperlipidemia, mixed 06/29/2013   Vitamin D deficiency 06/29/2013   IBS (irritable bowel syndrome)    Psoriasis    Osteoporosis    History of ITP     Screening Tests Immunization History  Administered Date(s) Administered   Fluad Quad(high Dose 65+) 02/15/2020   Influenza Split 01/11/2019   Influenza, High Dose Seasonal PF 01/25/2014, 01/22/2017, 01/03/2021   Influenza,inj,quad, With Preservative 01/19/2017   Influenza-Unspecified 12/21/2014, 01/22/2016, 01/18/2018   PFIZER(Purple Top)SARS-COV-2 Vaccination 05/11/2019, 06/01/2019, 02/01/2020   PPD Test 03/14/2013   Pfizer Covid-19 Vaccine Bivalent Booster 12yr & up 12/25/2020   Pneumococcal Conjugate-13 12/05/2015   Pneumococcal Polysaccharide-23 02/08/2009, 06/30/2014   Td 02/08/2009   Tdap 12/02/2018   Zoster, Live 03/29/2009    Preventative care: Last colonoscopy: refuses  Cologuard: 10/2017 ordered today  Last mammogram:  12/2019 DEXA: 12/2019 osteoporosis, doesn't tolerate fosamax, referred to Dr. GCruzita Lederer declining meds, discussed exercise  Prior vaccinations: TD or Tdap: 2020 Influenza:  01/2020  Pneumococcal: 2016 Prevnar13: 2017 Shingles/Zostavax: 2016 - ambivalent, check with insurance  Covid 19: 2/2, 2021, pfizer, + booster  Names of Other Physician/Practitioners  you currently use: 1. [facility]- here for primary care 2. Dr. Satira Sark, eye doctor, last visit 2022 3. [place], Dr. Louanne Belton, dentist, last visit 2022, q2m Patient Care Team: MUnk Pinto MD as PCP - General (Internal Medicine) SShon Hough MD as Consulting Physician (Ophthalmology) HAllyn Kenner MD (Dermatology)  Past Surgical History:  Procedure Laterality Date   ABDOMINAL HYSTERECTOMY     APPENDECTOMY     BREAST SURGERY Right    Biospy, benign   EYE SURGERY Bilateral    Cataracts    EYE SURGERY     Lense implants   TUBAL LIGATION     Family History  Problem Relation Age of Onset   AAA (abdominal aortic aneurysm) Mother    Hypertension Mother    Hypertension Father    Heart disease Father    Hyperlipidemia Father    Cancer Maternal Aunt        uterine   Social History   Tobacco Use   Smoking status: Never   Smokeless tobacco: Never  Substance Use Topics   Alcohol use: Yes    Comment: occasional wine 1-2 drinks   Drug use: No    MEDICARE WELLNESS OBJECTIVES: Physical activity:   Cardiac risk factors:   Depression/mood screen:      05/05/2021   11:41 PM  Depression screen PHQ 2/9  Decreased Interest 0  Down, Depressed, Hopeless 0  PHQ - 2 Score 0    ADLs:     05/05/2021   11:42 PM  In your present state of health, do you have any difficulty performing the following activities:  Hearing? 0  Vision? 0  Difficulty concentrating or making decisions? 0  Walking or climbing stairs? 0  Dressing or bathing? 0  Doing errands, shopping? 0     Cognitive Testing  Alert? Yes  Normal Appearance?Yes  Oriented to person? Yes  Place? Yes   Time? Yes  Recall of three objects?  Yes  Can perform simple calculations? Yes  Displays appropriate judgment?Yes  Can read the correct time from a watch face?Yes  EOL planning:       Objective:   There were no vitals filed for this visit.   There is no height or weight on file to calculate BMI.  General appearance: alert, no distress, WD/WN,  female HEENT: normocephalic, sclerae anicteric, TMs pearly, nares patent, no discharge or erythema, pharynx normal Oral cavity: MMM, no lesions Neck: supple, no lymphadenopathy, no thyromegaly, no masses Heart: RRR, normal S1, S2, no murmurs Lungs: CTA bilaterally, no wheezes, rhonchi, or rales Abdomen: +bs, soft, non tender, non distended, no masses, no hepatomegaly, no splenomegaly Musculoskeletal: nontender, no swelling, no obvious deformity Extremities: no  edema, no cyanosis, no clubbing Pulses: 2+ symmetric, upper and lower extremities, normal cap refill Neurological: alert, oriented x 3, CN2-12 intact, strength normal upper extremities and lower extremities, sensation normal throughout, DTRs 2+ throughout, no cerebellar signs, gait normal Psychiatric: normal affect, behavior normal, pleasant   Medicare Attestation I have personally reviewed: The patient's medical and social history Their use of alcohol, tobacco or illicit drugs Their current medications and supplements The patient's functional ability including ADLs,fall risks, home safety risks, cognitive, and hearing and visual impairment Diet and physical activities Evidence for depression or mood disorders  The patient's weight, height, BMI, and visual acuity have been recorded in the chart.  I have made referrals, counseling, and provided education to the patient based on review of the above and I  have provided the patient with a written personalized care plan for preventive services.     Alycia Rossetti, NP   12/27/2021

## 2021-12-30 ENCOUNTER — Ambulatory Visit (INDEPENDENT_AMBULATORY_CARE_PROVIDER_SITE_OTHER): Payer: Medicare HMO | Admitting: Nurse Practitioner

## 2021-12-30 ENCOUNTER — Encounter: Payer: Self-pay | Admitting: Nurse Practitioner

## 2021-12-30 VITALS — BP 138/80 | HR 67 | Temp 97.7°F | Ht 65.0 in | Wt 127.8 lb

## 2021-12-30 DIAGNOSIS — Z6822 Body mass index (BMI) 22.0-22.9, adult: Secondary | ICD-10-CM

## 2021-12-30 DIAGNOSIS — Z862 Personal history of diseases of the blood and blood-forming organs and certain disorders involving the immune mechanism: Secondary | ICD-10-CM

## 2021-12-30 DIAGNOSIS — M810 Age-related osteoporosis without current pathological fracture: Secondary | ICD-10-CM | POA: Diagnosis not present

## 2021-12-30 DIAGNOSIS — E559 Vitamin D deficiency, unspecified: Secondary | ICD-10-CM

## 2021-12-30 DIAGNOSIS — Z79899 Other long term (current) drug therapy: Secondary | ICD-10-CM

## 2021-12-30 DIAGNOSIS — E782 Mixed hyperlipidemia: Secondary | ICD-10-CM

## 2021-12-30 DIAGNOSIS — R6889 Other general symptoms and signs: Secondary | ICD-10-CM

## 2021-12-30 DIAGNOSIS — M87052 Idiopathic aseptic necrosis of left femur: Secondary | ICD-10-CM

## 2021-12-30 DIAGNOSIS — Z0000 Encounter for general adult medical examination without abnormal findings: Secondary | ICD-10-CM

## 2021-12-30 DIAGNOSIS — R030 Elevated blood-pressure reading, without diagnosis of hypertension: Secondary | ICD-10-CM | POA: Diagnosis not present

## 2021-12-30 DIAGNOSIS — Z0001 Encounter for general adult medical examination with abnormal findings: Secondary | ICD-10-CM | POA: Diagnosis not present

## 2021-12-30 DIAGNOSIS — R7309 Other abnormal glucose: Secondary | ICD-10-CM | POA: Diagnosis not present

## 2021-12-30 DIAGNOSIS — Z6821 Body mass index (BMI) 21.0-21.9, adult: Secondary | ICD-10-CM

## 2021-12-30 DIAGNOSIS — L409 Psoriasis, unspecified: Secondary | ICD-10-CM | POA: Diagnosis not present

## 2021-12-30 DIAGNOSIS — K589 Irritable bowel syndrome without diarrhea: Secondary | ICD-10-CM

## 2021-12-31 LAB — HEMOGLOBIN A1C
Hgb A1c MFr Bld: 5.4 % of total Hgb (ref <5.7)
Mean Plasma Glucose: 108 mg/dL
eAG (mmol/L): 6 mmol/L

## 2021-12-31 LAB — COMPLETE METABOLIC PANEL WITH GFR
AG Ratio: 1.5 (calc) (ref 1.0–2.5)
ALT: 18 U/L (ref 6–29)
AST: 20 U/L (ref 10–35)
Albumin: 4 g/dL (ref 3.6–5.1)
Alkaline phosphatase (APISO): 75 U/L (ref 37–153)
BUN: 13 mg/dL (ref 7–25)
CO2: 28 mmol/L (ref 20–32)
Calcium: 9.4 mg/dL (ref 8.6–10.4)
Chloride: 106 mmol/L (ref 98–110)
Creat: 0.71 mg/dL (ref 0.60–0.95)
Globulin: 2.6 g/dL (calc) (ref 1.9–3.7)
Glucose, Bld: 90 mg/dL (ref 65–99)
Potassium: 4.3 mmol/L (ref 3.5–5.3)
Sodium: 142 mmol/L (ref 135–146)
Total Bilirubin: 0.8 mg/dL (ref 0.2–1.2)
Total Protein: 6.6 g/dL (ref 6.1–8.1)
eGFR: 85 mL/min/{1.73_m2} (ref >=60)

## 2021-12-31 LAB — CBC WITH DIFFERENTIAL/PLATELET
Absolute Monocytes: 392 cells/uL (ref 200–950)
Basophils Absolute: 49 cells/uL (ref 0–200)
Basophils Relative: 1 %
Eosinophils Absolute: 108 cells/uL (ref 15–500)
Eosinophils Relative: 2.2 %
HCT: 37.4 % (ref 35.0–45.0)
Hemoglobin: 12.5 g/dL (ref 11.7–15.5)
Lymphs Abs: 1485 cells/uL (ref 850–3900)
MCH: 31.2 pg (ref 27.0–33.0)
MCHC: 33.4 g/dL (ref 32.0–36.0)
MCV: 93.3 fL (ref 80.0–100.0)
MPV: 12 fL (ref 7.5–12.5)
Monocytes Relative: 8 %
Neutro Abs: 2867 cells/uL (ref 1500–7800)
Neutrophils Relative %: 58.5 %
Platelets: 180 10*3/uL (ref 140–400)
RBC: 4.01 10*6/uL (ref 3.80–5.10)
RDW: 13.1 % (ref 11.0–15.0)
Total Lymphocyte: 30.3 %
WBC: 4.9 10*3/uL (ref 3.8–10.8)

## 2021-12-31 LAB — LIPID PANEL
Cholesterol: 152 mg/dL (ref <200)
HDL: 44 mg/dL — ABNORMAL LOW (ref >=50)
LDL Cholesterol (Calc): 95 mg/dL (calc)
Non-HDL Cholesterol (Calc): 108 mg/dL (calc) (ref <130)
Total CHOL/HDL Ratio: 3.5 (calc) (ref <5.0)
Triglycerides: 50 mg/dL (ref <150)

## 2022-01-03 DIAGNOSIS — H0015 Chalazion left lower eyelid: Secondary | ICD-10-CM | POA: Diagnosis not present

## 2022-01-05 ENCOUNTER — Encounter: Payer: Self-pay | Admitting: Nurse Practitioner

## 2022-01-06 ENCOUNTER — Other Ambulatory Visit: Payer: Self-pay | Admitting: Nurse Practitioner

## 2022-01-06 DIAGNOSIS — E782 Mixed hyperlipidemia: Secondary | ICD-10-CM

## 2022-01-06 MED ORDER — GEMFIBROZIL 600 MG PO TABS: ORAL_TABLET | ORAL | 2 refills | Status: DC

## 2022-01-22 ENCOUNTER — Ambulatory Visit
Admission: RE | Admit: 2022-01-22 | Discharge: 2022-01-22 | Disposition: A | Discharge status: Home / Self Care | Payer: Medicare HMO | Source: Ambulatory Visit | Attending: Internal Medicine | Admitting: Internal Medicine

## 2022-01-22 DIAGNOSIS — Z1231 Encounter for screening mammogram for malignant neoplasm of breast: Secondary | ICD-10-CM | POA: Diagnosis not present

## 2022-04-22 ENCOUNTER — Other Ambulatory Visit: Payer: Self-pay | Admitting: Nurse Practitioner

## 2022-05-08 ENCOUNTER — Encounter: Payer: Self-pay | Admitting: Internal Medicine

## 2022-05-08 ENCOUNTER — Ambulatory Visit (INDEPENDENT_AMBULATORY_CARE_PROVIDER_SITE_OTHER): Payer: Medicare HMO | Admitting: Internal Medicine

## 2022-05-08 VITALS — BP 138/70 | HR 68 | Temp 97.9°F | Resp 16 | Ht 65.0 in | Wt 125.2 lb

## 2022-05-08 DIAGNOSIS — E782 Mixed hyperlipidemia: Secondary | ICD-10-CM

## 2022-05-08 DIAGNOSIS — M810 Age-related osteoporosis without current pathological fracture: Secondary | ICD-10-CM | POA: Diagnosis not present

## 2022-05-08 DIAGNOSIS — Z0000 Encounter for general adult medical examination without abnormal findings: Secondary | ICD-10-CM | POA: Diagnosis not present

## 2022-05-08 DIAGNOSIS — Z79899 Other long term (current) drug therapy: Secondary | ICD-10-CM

## 2022-05-08 DIAGNOSIS — R030 Elevated blood-pressure reading, without diagnosis of hypertension: Secondary | ICD-10-CM | POA: Diagnosis not present

## 2022-05-08 DIAGNOSIS — E559 Vitamin D deficiency, unspecified: Secondary | ICD-10-CM

## 2022-05-08 DIAGNOSIS — G720 Drug-induced myopathy: Secondary | ICD-10-CM

## 2022-05-08 DIAGNOSIS — Z136 Encounter for screening for cardiovascular disorders: Secondary | ICD-10-CM

## 2022-05-08 DIAGNOSIS — R7309 Other abnormal glucose: Secondary | ICD-10-CM

## 2022-05-08 DIAGNOSIS — Z8249 Family history of ischemic heart disease and other diseases of the circulatory system: Secondary | ICD-10-CM

## 2022-05-08 DIAGNOSIS — Z0001 Encounter for general adult medical examination with abnormal findings: Secondary | ICD-10-CM

## 2022-05-08 DIAGNOSIS — Z1211 Encounter for screening for malignant neoplasm of colon: Secondary | ICD-10-CM

## 2022-05-08 NOTE — Patient Instructions (Signed)

## 2022-05-08 NOTE — Progress Notes (Signed)
Annual Screening/Preventative Visit & Comprehensive Evaluation &  Examination  Future Appointments  Date Time Provider Department  05/06/2021  3:00 PM Unk Pinto, MD [facility]  10/31/2021 10:30 AM Liane Comber, NP [facility]  05/08/2022  3:00 PM Unk Pinto, MD [facility]        This very nice 82 y.o. WWF presents for a Screening /Preventative Visit & comprehensive evaluation and management of multiple medical co-morbidities.  Patient has been followed for labile HTN, HLD, Prediabetes  and Vitamin D Deficiency. Patient is also Followed by Dr Philemon Kingdom for Osteoporosis.         Patient is followed expectantly for labile HTN .   Patient's BP has been controlled and patient denies any cardiac symptoms as chest pain, palpitations, shortness of breath, dizziness or ankle swelling. Today's BP is at goal - 138/70 .       Patient has hx/o Statin intolerance and her hyperlipidemia is near controlled with diet and ezetimibe /Gemfibrozil.  Patient denies myalgias or other medication SE's. Last lipids were near goal :   Lab Results  Component Value Date   CHOL 152 12/30/2021   HDL 44 (L) 12/30/2021   LDLCALC 95 12/30/2021   TRIG 50 12/30/2021   CHOLHDL 3.5 12/30/2021         Patient has hx/o prediabetes  (A1c 5.8% /2015) and patient denies reactive hypoglycemic symptoms, visual blurring, diabetic polys or paresthesias. Last A1c was Normal & at goal :  Lab Results  Component Value Date   HGBA1C 5.4 12/30/2021         Finally, patient has history of Vitamin D Deficiency ("34" /2010) and last Vitamin D was at  goal :  Lab Results  Component Value Date   VD25OH 41 05/06/2021       Current Outpatient Medications  Medication Instructions   aspirin EC 81 mg, Oral, Daily, Swallow whole.   Calcium Carbonate-Vit D-Min (CALCIUM 1200 PO) Oral   cyanocobalamin (VITAMIN B12) 250 mcg, Oral, Daily   ezetimibe (ZETIA) 10 mg, Oral, Daily   gemfibrozil (LOPID) 600 MG tablet  Take  1 tablet  2 x /day  with Meals  for Cholesterol        TAKE 1 TABLET BY MOUTH TWICE DAILY WITH A MEAL FOR CHOLESTEROL   Multiple Vitamin (MULTIVITAMIN) tablet 1 tablet, Oral, Daily   Probiotic Product (ALIGN PO) Oral, Daily   VITAMIN D PO Oral, Take 5000 IU QOD and 10,000 IU QOD     Allergies  Allergen Reactions   Atorvastatin Other (See Comments)    myalgia   Fosamax [Alendronate Sodium] Other (See Comments)    Myalgia     Past Medical History:  Diagnosis Date   History of ITP    Hyperlipidemia    IBS (irritable bowel syndrome)    Osteopenia    Psoriasis      Health Maintenance  Topic Date Due   Zoster Vaccines- Shingrix (1 of 2) Never done   COVID-19 Vaccine (4 - Booster for Pfizer series) 03/28/2020   INFLUENZA VACCINE  11/19/2020   TETANUS/TDAP  12/01/2028   Pneumonia Vaccine 60+ Years old  Completed   DEXA SCAN  Completed   HPV VACCINES  Aged Out     Immunization History  Administered Date(s) Administered   Fluad Quad(high Dose) 02/15/2020   Influenza Split 01/11/2019   Influenza, High Dose  01/25/2014, 01/22/2017   Influenza,inj,quad 01/19/2017   Influenza 12/21/2014, 01/22/2016, 01/18/2018   PFIZER SARS-COV-2 Vacc 05/11/2019, 06/01/2019, 02/01/2020  PPD Test 03/14/2013   Pneumococcal -13 12/05/2015   Pneumococcal -23 02/08/2009, 06/30/2014   Td 02/08/2009   Tdap 12/02/2018   Zoster, Live 03/29/2009    Last Colon - 03/14/2013 - Patient refused  Cologuard - 07/31/ 2019 -Negative - Recc 3 yr  f/u  Cologuard -11/28/2020 - Positive  - Colonoscopy recommended  Cologuard - 04/03/2021 - Positive   Colonoscopy - 04/23/2021 - Dr Ronnette Juniper  [facility] ) - Results pending from 12 days ago .    Last MGM -  01/15/2021   Past Surgical History:  Procedure Laterality Date   ABDOMINAL HYSTERECTOMY     APPENDECTOMY     BREAST SURGERY Right    Biospy, benign   EYE SURGERY Bilateral    Cataracts   EYE SURGERY     Lense implants   TUBAL LIGATION        Family History  Problem Relation Age of Onset   AAA (abdominal aortic aneurysm) Mother    Hypertension Mother    Hypertension Father    Heart disease Father    Hyperlipidemia Father    Cancer Maternal Aunt        uterine     Social History   Tobacco Use   Smoking status: Never   Smokeless tobacco: Never  Substance Use Topics   Alcohol use: Yes    Comment: occasional wine 1-2 drinks   Drug use: No      ROS Constitutional: Denies fever, chills, weight loss/gain, headaches, insomnia,  night sweats, and change in appetite. Does c/o fatigue. Eyes: Denies redness, blurred vision, diplopia, discharge, itchy, watery eyes.  ENT: Denies discharge, congestion, post nasal drip, epistaxis, sore throat, earache, hearing loss, dental pain, Tinnitus, Vertigo, Sinus pain, snoring.  Cardio: Denies chest pain, palpitations, irregular heartbeat, syncope, dyspnea, diaphoresis, orthopnea, PND, claudication, edema Respiratory: denies cough, dyspnea, DOE, pleurisy, hoarseness, laryngitis, wheezing.  Gastrointestinal: Denies dysphagia, heartburn, reflux, water brash, pain, cramps, nausea, vomiting, bloating, diarrhea, constipation, hematemesis, melena, hematochezia, jaundice, hemorrhoids Genitourinary: Denies dysuria, frequency, urgency, nocturia, hesitancy, discharge, hematuria, flank pain Breast: Breast lumps, nipple discharge, bleeding.  Musculoskeletal: Denies arthralgia, myalgia, stiffness, Jt. Swelling, pain, limp, and strain/sprain. Denies falls. Skin: Denies puritis, rash, hives, warts, acne, eczema, changing in skin lesion Neuro: No weakness, tremor, incoordination, spasms, paresthesia, pain Psychiatric: Denies confusion, memory loss, sensory loss. Denies Depression. Endocrine: Denies change in weight, skin, hair change, nocturia, and paresthesia, diabetic polys, visual blurring, hyper / hypo glycemic episodes.  Heme/Lymph: No excessive bleeding, bruising, enlarged lymph  nodes.  Physical Exam  There were no vitals taken for this visit.  General Appearance: Well nourished, well groomed and in no apparent distress.  Eyes: PERRLA, EOMs, conjunctiva no swelling or erythema, normal fundi and vessels. Sinuses: No frontal/maxillary tenderness ENT/Mouth: EACs patent / TMs  nl. Nares clear without erythema, swelling, mucoid exudates. Oral hygiene is good. No erythema, swelling, or exudate. Tongue normal, non-obstructing. Tonsils not swollen or erythematous. Hearing normal.  Neck: Supple, thyroid not palpable. No bruits, nodes or JVD. Respiratory: Respiratory effort normal.  BS equal and clear bilateral without rales, rhonci, wheezing or stridor. Cardio: Heart sounds are normal with regular rate and rhythm and no murmurs, rubs or gallops. Peripheral pulses are normal and equal bilaterally without edema. No aortic or femoral bruits. Chest: symmetric with normal excursions and percussion. Breasts: Symmetric, without lumps, nipple discharge, retractions, or fibrocystic changes.  Abdomen: Flat, soft with bowel sounds active. Nontender, no guarding, rebound, hernias, masses, or organomegaly.  Lymphatics: Non tender without lymphadenopathy.  Musculoskeletal: Full ROM all peripheral extremities, joint stability, 5/5 strength, and normal gait. Skin: Warm and dry without rashes, lesions, cyanosis, clubbing or  ecchymosis.  Neuro: Cranial nerves intact, reflexes equal bilaterally. Normal muscle tone, no cerebellar symptoms. Sensation intact.  Pysch: Alert and oriented X 3, normal affect, Insight and Judgment appropriate.    Assessment and Plan  1. Annual Preventative Screening Examination   2. Elevated BP w/o Dx HTN  - EKG 12-Lead - Urinalysis, Routine w reflex microscopic - Microalbumin / creatinine urine ratio - CBC with Differential/Platelet - COMPLETE METABOLIC PANEL WITH GFR - Magnesium - TSH  3. Hyperlipidemia, mixed  - EKG 12-Lead - Lipid panel -  TSH  4. Statin myopathy   5. Abnormal glucose  - Hemoglobin A1c - Insulin, random  6. Vitamin D deficiency  - VITAMIN D 25 Hydroxy (  7. Age-related osteoporosis without current pathological fracture  - COMPLETE METABOLIC PANEL WITH GFR  8. Screening for heart disease  - EKG 12-Lead  9. FHx: heart disease  - EKG 12-Lead  10. Medication management  - Urinalysis, Routine w reflex microscopic - Microalbumin / creatinine urine ratio - CBC with Differential/Platelet - COMPLETE METABOLIC PANEL WITH GFR - Magnesium - Lipid panel - TSH - Hemoglobin A1c - Insulin, random - VITAMIN D 25 Hydroxy   11. Screening for colorectal cancer  - POC Hemoccult Bld/Stl         Patient was counseled in prudent diet to achieve/maintain BMI less than 25 for weight control, BP monitoring, regular exercise and medications. Discussed med's effects and SE's. Screening labs and tests as requested with regular follow-up as recommended. Over 40 minutes of exam, counseling, chart review and high complex critical decision making was performed.   Kirtland Bouchard, MD

## 2022-05-09 LAB — TSH: TSH: 1.14 mIU/L (ref 0.40–4.50)

## 2022-05-09 LAB — COMPLETE METABOLIC PANEL WITH GFR
AG Ratio: 1.5 (calc) (ref 1.0–2.5)
ALT: 17 U/L (ref 6–29)
AST: 18 U/L (ref 10–35)
Albumin: 4.3 g/dL (ref 3.6–5.1)
Alkaline phosphatase (APISO): 90 U/L (ref 37–153)
BUN: 19 mg/dL (ref 7–25)
CO2: 29 mmol/L (ref 20–32)
Calcium: 9.7 mg/dL (ref 8.6–10.4)
Chloride: 104 mmol/L (ref 98–110)
Creat: 0.66 mg/dL (ref 0.60–0.95)
Globulin: 2.9 g/dL (calc) (ref 1.9–3.7)
Glucose, Bld: 86 mg/dL (ref 65–99)
Potassium: 4.1 mmol/L (ref 3.5–5.3)
Sodium: 141 mmol/L (ref 135–146)
Total Bilirubin: 0.5 mg/dL (ref 0.2–1.2)
Total Protein: 7.2 g/dL (ref 6.1–8.1)
eGFR: 88 mL/min/{1.73_m2} (ref >=60)

## 2022-05-09 LAB — CBC WITH DIFFERENTIAL/PLATELET
Absolute Monocytes: 472 cells/uL (ref 200–950)
Basophils Absolute: 42 cells/uL (ref 0–200)
Basophils Relative: 0.8 %
Eosinophils Absolute: 159 cells/uL (ref 15–500)
Eosinophils Relative: 3 %
HCT: 35 % (ref 35.0–45.0)
Hemoglobin: 11.7 g/dL (ref 11.7–15.5)
Lymphs Abs: 1558 cells/uL (ref 850–3900)
MCH: 31.5 pg (ref 27.0–33.0)
MCHC: 33.4 g/dL (ref 32.0–36.0)
MCV: 94.1 fL (ref 80.0–100.0)
MPV: 11.2 fL (ref 7.5–12.5)
Monocytes Relative: 8.9 %
Neutro Abs: 3069 cells/uL (ref 1500–7800)
Neutrophils Relative %: 57.9 %
Platelets: 217 10*3/uL (ref 140–400)
RBC: 3.72 10*6/uL — ABNORMAL LOW (ref 3.80–5.10)
RDW: 12.2 % (ref 11.0–15.0)
Total Lymphocyte: 29.4 %
WBC: 5.3 10*3/uL (ref 3.8–10.8)

## 2022-05-09 LAB — URINALYSIS, ROUTINE W REFLEX MICROSCOPIC
Bacteria, UA: NONE SEEN /HPF
Bilirubin Urine: NEGATIVE
Glucose, UA: NEGATIVE
Hgb urine dipstick: NEGATIVE
Hyaline Cast: NONE SEEN /LPF
Ketones, ur: NEGATIVE
Nitrite: NEGATIVE
Protein, ur: NEGATIVE
RBC / HPF: NONE SEEN /HPF (ref 0–2)
Specific Gravity, Urine: 1.017 (ref 1.001–1.035)
Squamous Epithelial / HPF: NONE SEEN /HPF (ref <=5)
pH: <=5 (ref 5.0–8.0)

## 2022-05-09 LAB — LIPID PANEL
Cholesterol: 174 mg/dL (ref <200)
HDL: 57 mg/dL (ref >=50)
LDL Cholesterol (Calc): 106 mg/dL (calc) — ABNORMAL HIGH
Non-HDL Cholesterol (Calc): 117 mg/dL (calc) (ref <130)
Total CHOL/HDL Ratio: 3.1 (calc) (ref <5.0)
Triglycerides: 38 mg/dL (ref <150)

## 2022-05-09 LAB — HEMOGLOBIN A1C
Hgb A1c MFr Bld: 5.7 % of total Hgb — ABNORMAL HIGH (ref <5.7)
Mean Plasma Glucose: 117 mg/dL
eAG (mmol/L): 6.5 mmol/L

## 2022-05-09 LAB — MICROALBUMIN / CREATININE URINE RATIO
Creatinine, Urine: 107 mg/dL (ref 20–275)
Microalb Creat Ratio: 4 mcg/mg creat (ref <30)
Microalb, Ur: 0.4 mg/dL

## 2022-05-09 LAB — VITAMIN D 25 HYDROXY (VIT D DEFICIENCY, FRACTURES): Vit D, 25-Hydroxy: 73 ng/mL (ref 30–100)

## 2022-05-09 LAB — MAGNESIUM: Magnesium: 1.9 mg/dL (ref 1.5–2.5)

## 2022-05-09 LAB — MICROSCOPIC MESSAGE

## 2022-05-09 LAB — INSULIN, RANDOM: Insulin: 1.7 u[IU]/mL

## 2022-05-11 NOTE — Progress Notes (Signed)
<><><><><><><><><><><><><><><><><><><><><><><><><><><><><><><><><> <><><><><><><><><><><><><><><><><><><><><><><><><><><><><><><><><> -   Test results slightly outside the reference range are not unusual. If there is anything important, I will review this with you,  otherwise it is considered normal test values.  If you have further questions,  please do not hesitate to contact me at the office or via My Chart.  <><><><><><><><><><><><><><><><><><><><><><><><><><><><><><><><><> <><><><><><><><><><><><><><><><><><><><><><><><><><><><><><><><><>  -  Total Chol = 174   Excellent   - Very low risk for Heart Attack  / Stroke  -  But . . . . . Marland Kitchen   LDL Chol = 106 is elevated  ( Goal is below 70  !  )   So . . . .  Need to work harder on diet   - Recommend a stricter low cholesterol diet   - Cholesterol only comes from animal sources                                                                                     - ie. meat, dairy, egg yolks  - Eat all the vegetables you want.  - Avoid Meat, Avoid Meat,  Avoid Meat                                                                   - especially Red Meat - Beef AND Pork  - Avoid cheese & dairy - milk & ice cream.     - Cheese is the most concentrated form of trans-fats which  is the worst thing to clog up our arteries.   - Veggie cheese is OK which can be found in the fresh  produce section at [employer] or [employer] or [employer] <><><><><><><><><><><><><><><><><><><><><><><><><><><><><><><><><> <><><><><><><><><><><><><><><><><><><><><><><><><><><><><><><><><>  -  A1c = 5.7% is back again in borderline elevated blood sugars.  So . . . . . . Marland Kitchen   - Avoid Sweets, Candy & White Stuff   - White Rice, White [place], White Flour  - Breads &  Pasta <><><><><><><><><><><><><><><><><><><><><><><><><><><><><><><><><> <><><><><><><><><><><><><><><><><><><><><><><><><><><><><><><><><>  -  Vitamin D = 73 - is Excellent  !         Please continue dose same <><><><><><><><><><><><><><><><><><><><><><><><><><><><><><><><><>  -  All Else - CBC - Kidneys - Electrolytes - Liver - Magnesium & Thyroid    - all  Normal / OK <><><><><><><><><><><><><><><><><><><><><><><><><><><><><><><><><> <><><><><><><><><><><><><><><><><><><><><><><><><><><><><><><><><>

## 2022-05-16 ENCOUNTER — Encounter: Payer: Self-pay | Admitting: Internal Medicine

## 2022-06-11 DIAGNOSIS — D23122 Other benign neoplasm of skin of left lower eyelid, including canthus: Secondary | ICD-10-CM | POA: Diagnosis not present

## 2022-06-11 DIAGNOSIS — H5712 Ocular pain, left eye: Secondary | ICD-10-CM | POA: Diagnosis not present

## 2022-06-18 DIAGNOSIS — H2000 Unspecified acute and subacute iridocyclitis: Secondary | ICD-10-CM | POA: Diagnosis not present

## 2022-06-18 DIAGNOSIS — D23122 Other benign neoplasm of skin of left lower eyelid, including canthus: Secondary | ICD-10-CM | POA: Diagnosis not present

## 2022-07-07 ENCOUNTER — Other Ambulatory Visit: Payer: Self-pay

## 2022-07-07 DIAGNOSIS — Z1212 Encounter for screening for malignant neoplasm of rectum: Secondary | ICD-10-CM | POA: Diagnosis not present

## 2022-07-07 DIAGNOSIS — Z1211 Encounter for screening for malignant neoplasm of colon: Secondary | ICD-10-CM

## 2022-07-07 LAB — POC HEMOCCULT BLD/STL (HOME/3-CARD/SCREEN)
Card #2 Fecal Occult Blod, POC: NEGATIVE
Card #3 Fecal Occult Blood, POC: NEGATIVE
Fecal Occult Blood, POC: NEGATIVE

## 2022-08-12 ENCOUNTER — Encounter: Payer: Self-pay | Admitting: Nurse Practitioner

## 2022-08-12 ENCOUNTER — Ambulatory Visit (INDEPENDENT_AMBULATORY_CARE_PROVIDER_SITE_OTHER): Payer: Medicare HMO | Admitting: Nurse Practitioner

## 2022-08-12 VITALS — BP 114/62 | HR 54 | Temp 97.5°F | Ht 65.0 in | Wt 127.4 lb

## 2022-08-12 DIAGNOSIS — M87052 Idiopathic aseptic necrosis of left femur: Secondary | ICD-10-CM | POA: Diagnosis not present

## 2022-08-12 DIAGNOSIS — R030 Elevated blood-pressure reading, without diagnosis of hypertension: Secondary | ICD-10-CM

## 2022-08-12 DIAGNOSIS — G720 Drug-induced myopathy: Secondary | ICD-10-CM | POA: Diagnosis not present

## 2022-08-12 DIAGNOSIS — Z862 Personal history of diseases of the blood and blood-forming organs and certain disorders involving the immune mechanism: Secondary | ICD-10-CM | POA: Diagnosis not present

## 2022-08-12 DIAGNOSIS — R7309 Other abnormal glucose: Secondary | ICD-10-CM | POA: Diagnosis not present

## 2022-08-12 DIAGNOSIS — R6889 Other general symptoms and signs: Secondary | ICD-10-CM

## 2022-08-12 DIAGNOSIS — L409 Psoriasis, unspecified: Secondary | ICD-10-CM

## 2022-08-12 DIAGNOSIS — E782 Mixed hyperlipidemia: Secondary | ICD-10-CM

## 2022-08-12 DIAGNOSIS — Z79899 Other long term (current) drug therapy: Secondary | ICD-10-CM | POA: Diagnosis not present

## 2022-08-12 DIAGNOSIS — M810 Age-related osteoporosis without current pathological fracture: Secondary | ICD-10-CM

## 2022-08-12 DIAGNOSIS — Z0001 Encounter for general adult medical examination with abnormal findings: Secondary | ICD-10-CM

## 2022-08-12 DIAGNOSIS — Z6821 Body mass index (BMI) 21.0-21.9, adult: Secondary | ICD-10-CM | POA: Diagnosis not present

## 2022-08-12 DIAGNOSIS — K589 Irritable bowel syndrome without diarrhea: Secondary | ICD-10-CM

## 2022-08-12 DIAGNOSIS — E559 Vitamin D deficiency, unspecified: Secondary | ICD-10-CM | POA: Diagnosis not present

## 2022-08-12 DIAGNOSIS — Z0000 Encounter for general adult medical examination without abnormal findings: Secondary | ICD-10-CM

## 2022-08-12 NOTE — Progress Notes (Signed)
Patient ID: Madison Gonzalez, female   DOB: [birth year], 82 y.o.   MRN: [id number]  MEDICARE ANNUAL WELLNESS VISIT AND FOLLOW UP  Assessment:    Encounter for Medicare annual wellness exam Due annually  Health maintenance reviewed  Irritable bowel syndrome, unspecified type Controlled at this time without flare Doing well with align probiotic  Psoriasis -  better at this time, topical PRN  Vitamin D deficiency At goal at recent check; continue to recommend supplementation for goal of 70-100  Abnormal glucose Discussed diet/exercise, weight management  Check A1c  Hyperlipidemia Continue medications- Zetia Continue low cholesterol diet and exercise.  Check lipid panel.   History of ITP Monitor CBC  Elevated BP w/o Dx HTN Monitor blood pressure at home; call if consistently over 130/80 Continue DASH diet.   Reminder to go to the ER if any CP, SOB, nausea, dizziness, severe HA, changes vision/speech, left arm numbness and tingling and jaw pain.  Body mass index (BMI) of 21 Continue to recommend diet heavy in fruits and veggies and low in animal meats, cheeses, and dairy products, appropriate calorie intake Discuss exercise recommendations routinely Continue to monitor weight at each visit   Osteoporosis Weight bearing exercises, high calcium diet, supplement vitamin D Reviewed lifestyle in depth, importance of preventing falls and lifestyle changes to improve or maintain bone density  Fosamax intolerance, Dr. Elvera Lennox is following, declined meds Long discussion about lifestyle interventions today  Avascular necrosis of hip, left (HCC) Has seen ortho; monitoring only; hip replacement if progressive/limiting pain   Medication Management CBC, CMP   Over 30 minutes of exam, counseling, chart review, and critical decision making was performed  Future Appointments  Date Time Provider Department Center  11/11/2022 11:30 AM Lucky Cowboy, MD [facility] None   05/14/2023  3:00 PM Lucky Cowboy, MD [facility] None  11/11/2023 10:00 AM Raynelle Dick, NP [facility] None     Plan:   During the course of the visit the patient was educated and counseled about appropriate screening and preventive services including:   Pneumococcal vaccine  Influenza vaccine Td vaccine Prevnar 13 Screening electrocardiogram Screening mammography Bone densitometry screening Colorectal cancer screening Diabetes screening Glaucoma screening Nutrition counseling  Advanced directives: given info/requested copies   Subjective:   Madison Gonzalez is a 82 y.o. female who presents for Medicare Annual Wellness Visit and 3 month follow up on hypertension, glucose management, hyperlipidemia, vitamin D def.   [place], can fish on lake, stays very active on her land.   Colonoscopy 04/23/21 tubular adenoma  Allergies do sometimes bother her but usually controlled without medication.  She has L hip avascular necrosis per xray 12/2018 obtained for pelvic pain, saw Dr. Turner Daniels who recommended monitoring, if progressive/severe pain would have hip replacement. She also has osteoporosis with alendronate intolerance and was referred to Dr. Elvera Lennox for further management, discussed prolia/teriparatide but ultimately declined. She is taking calcium, vitamin D. Hip is doing much better.   Psoriasis - mild, does topical prn. Follows with derm.   BMI is Body mass index is 21.2 kg/m., she has been working on diet and exercise, very active around her yard, lives on [place] acres which she cares for by herself. Does ride stationary bike occasionally. She has been working on losing [employer] Readings from Last 3 Encounters:  08/12/22 127 lb 6.4 oz (57.8 kg)  05/08/22 125 lb 3.2 oz (56.8 kg)  12/30/21 127 lb 12.8 oz (58 kg)   Her  blood pressure has been controlled at home normally 120/65, today their BP is BP: 114/62  BP Readings from Last 3 Encounters:   08/12/22 114/62  05/08/22 138/70  12/30/21 138/80  She does not workout but very active. She denies chest pain, shortness of breath, dizziness.    She is on cholesterol medication (zetia) and denies myalgias. Hx of fatigue with statins. Her cholesterol is at goal. The cholesterol last visit was:   Lab Results  Component Value Date   CHOL 174 05/08/2022   HDL 57 05/08/2022   LDLCALC 106 (H) 05/08/2022   TRIG 38 05/08/2022   CHOLHDL 3.1 05/08/2022   She has been working on diet and exercise for glucose management, and denies foot ulcerations, hyperglycemia, hypoglycemia , increased appetite, nausea, paresthesia of the feet, polydipsia, polyuria, visual disturbances, vomiting and weight loss. Last A1C in the office was:  Lab Results  Component Value Date   HGBA1C 5.7 (H) 05/08/2022   Last GFR Lab Results  Component Value Date   EGFR 88 05/08/2022    Patient is on Vitamin D supplement. Lab Results  Component Value Date   VD25OH 73 05/08/2022      Medication Review Current Outpatient Medications on File Prior to Visit  Medication Sig Dispense Refill   aspirin EC 81 MG tablet Take 81 mg by mouth daily. Swallow whole.     Calcium Carbonate-Vit D-Min (CALCIUM 1200 PO) Take by mouth.     cyanocobalamin (VITAMIN B12) 250 MCG tablet Take 250 mcg by mouth daily.     ezetimibe (ZETIA) 10 MG tablet Take 1 tablet by mouth once daily 90 tablet 0   gemfibrozil (LOPID) 600 MG tablet Take  1 tablet  2 x /day  with Meals  for Cholesterol        TAKE 1 TABLET BY MOUTH TWICE DAILY WITH A MEAL FOR CHOLESTEROL 180 tablet 2   hydrocortisone (ANUSOL-HC) 2.5 % rectal cream 1 application Externally Twice a day for 14 days     Multiple Vitamin (MULTIVITAMIN) tablet Take 1 tablet by mouth daily.     Probiotic Product (ALIGN PO) Take by mouth daily.     VITAMIN D PO Take by mouth. Take 5000 IU QOD and 10,000 IU QOD     Zinc 25 MG TABS Take by mouth.     No current facility-administered medications  on file prior to visit.    Current Problems (verified) Patient Active Problem List   Diagnosis Date Noted   B12 deficiency 10/05/2021   Positive colorectal cancer screening using Cologuard test 12/05/2020   Statin myopathy 04/19/2020   Aseptic necrosis of bone of hip, left 10/03/2019   FHx: heart disease 02/23/2018   BMI 22.0-22.9, adult 03/31/2015   Abnormal glucose (prediabetes) 03/29/2015   Elevated BP w/o Dx HTN 06/29/2013   Hyperlipidemia, mixed 06/29/2013   Vitamin D deficiency 06/29/2013   IBS (irritable bowel syndrome)    Psoriasis    Osteoporosis    History of ITP     Screening Tests Immunization History  Administered Date(s) Administered   Fluad Quad(high Dose 65+) 02/15/2020   Influenza Split 01/11/2019   Influenza, High Dose Seasonal PF 01/25/2014, 01/22/2017, 01/03/2021   Influenza,inj,quad, With Preservative 01/19/2017   Influenza-Unspecified 12/21/2014, 01/22/2016, 01/18/2018   PFIZER Comirnaty(Gray Top)Covid-19 Tri-Sucrose Vaccine 01/29/2022   PFIZER(Purple Top)SARS-COV-2 Vaccination 05/11/2019, 06/01/2019, 02/01/2020   PPD Test 03/14/2013   Pfizer Covid-19 Vaccine Bivalent Booster 73yrs & up 12/25/2020   Pneumococcal Conjugate-13 12/05/2015   Pneumococcal Polysaccharide-23 02/08/2009,  06/30/2014   Respiratory Syncytial Virus Vaccine,Recomb Aduvanted(Arexvy) 02/28/2022   Td 02/08/2009   Tdap 12/02/2018   Zoster, Live 03/29/2009    Health Maintenance  Topic Date Due   COVID-19 Vaccine (6 - 2023-24 season) 08/28/2022 (Originally 03/26/2022)   Zoster Vaccines- Shingrix (1 of 2) 11/11/2022 (Originally 08/18/1959)   INFLUENZA VACCINE  11/20/2022   Medicare Annual Wellness (AWV)  08/12/2023   DTaP/Tdap/Td (3 - Td or Tdap) 12/01/2028   Pneumonia Vaccine 33+ Years old  Completed   DEXA SCAN  Completed   HPV VACCINES  Aged Out     Names of Other Physician/Practitioners you currently use: 1. [facility]- here for  primary care 2. Dr. Burgess Estelle, eye doctor, last visit 09/2021 has another visit in 3 days 3. [facility], Dr. Leland Her, dentist, last visit 03/2022, q12m  Patient Care Team: Lucky Cowboy, MD as PCP - General (Internal Medicine) Mckinley Jewel, MD as Consulting Physician (Ophthalmology) Nita Sells, MD (Dermatology)  Past Surgical History:  Procedure Laterality Date   ABDOMINAL HYSTERECTOMY     APPENDECTOMY     BREAST SURGERY Right    Biospy, benign   EYE SURGERY Bilateral    Cataracts   EYE SURGERY     Lense implants   TUBAL LIGATION     Family History  Problem Relation Age of Onset   AAA (abdominal aortic aneurysm) Mother    Hypertension Mother    Hypertension Father    Heart disease Father    Hyperlipidemia Father    Cancer Maternal Aunt        uterine   Social History   Tobacco Use   Smoking status: Never   Smokeless tobacco: Never  Substance Use Topics   Alcohol use: Yes    Comment: occasional wine 1-2 drinks   Drug use: No    MEDICARE WELLNESS OBJECTIVES: Physical activity: Current Exercise Habits: Home exercise routine, Type of exercise: yoga, Time (Minutes): 60, Frequency (Times/Week): 2, Weekly Exercise (Minutes/Week): 120, Intensity: Mild, Exercise limited by: None identified Cardiac risk factors: Cardiac Risk Factors include: dyslipidemia;advanced age (>35men, >64 women);hypertension Depression/mood screen:      08/12/2022   10:16 AM  Depression screen PHQ 2/9  Decreased Interest 0  Down, Depressed, Hopeless 0  PHQ - 2 Score 0    ADLs:     08/12/2022   10:16 AM 12/30/2021   10:13 AM  In your present state of health, do you have any difficulty performing the following activities:  Hearing? 0 0  Vision? 0 0  Difficulty concentrating or making decisions? 0 0  Walking or climbing stairs? 0 0  Dressing or bathing? 0 0  Doing errands, shopping? 0 0     Cognitive Testing  Alert? Yes  Normal Appearance?Yes  Oriented to person? Yes  Place?  Yes   Time? Yes  Recall of three objects?  Yes  Can perform simple calculations? Yes  Displays appropriate judgment?Yes  Can read the correct time from a watch face?Yes  EOL planning: Does Patient Have a Medical Advance Directive?: Yes Type of Advance Directive: Healthcare Power of Attorney, Living will Does patient want to make changes to medical advance directive?: No - Patient declined Copy of Healthcare Power of Attorney in Chart?: No - copy requested     Objective:   Today's Vitals   08/12/22 0953  BP: 114/62  Pulse: (!) 54  Temp: (!) 97.5 F (36.4 C)  SpO2: 99%  Weight: 127 lb 6.4 oz (57.8 kg)  Height: 5\' 5"  (1.651 m)     Body mass index is 21.2 kg/m.  General appearance: alert, no distress, WD/WN,  female HEENT: normocephalic, sclerae anicteric, TMs pearly, nares patent, no discharge or erythema, pharynx normal Oral cavity: MMM, no lesions Neck: supple, no lymphadenopathy, no thyromegaly, no masses Heart: RRR, normal S1, S2, no murmurs Lungs: CTA bilaterally, no wheezes, rhonchi, or rales Abdomen: +bs, soft, non tender, non distended, no masses, no hepatomegaly, no splenomegaly Musculoskeletal: nontender, no swelling, no obvious deformity Extremities: no edema, no cyanosis, no clubbing Pulses: 2+ symmetric, upper and lower extremities, normal cap refill Neurological: alert, oriented x 3, CN2-12 intact, strength normal upper extremities and lower extremities, sensation normal throughout, DTRs 2+ throughout, no cerebellar signs, gait normal Psychiatric: normal affect, behavior normal, pleasant   Medicare Attestation I have personally reviewed: The patient's medical and social history Their use of alcohol, tobacco or illicit drugs Their current medications and supplements The patient's functional ability including ADLs,fall risks, home safety risks, cognitive, and hearing and visual impairment Diet and physical activities Evidence for depression or mood  disorders  The patient's weight, height, BMI, and visual acuity have been recorded in the chart.  I have made referrals, counseling, and provided education to the patient based on review of the above and I have provided the patient with a written personalized care plan for preventive services.     Raynelle Dick, NP   08/12/2022

## 2022-08-12 NOTE — Patient Instructions (Signed)

## 2022-08-13 LAB — CBC WITH DIFFERENTIAL/PLATELET
Absolute Monocytes: 362 cells/uL (ref 200–950)
Basophils Absolute: 52 cells/uL (ref 0–200)
Basophils Relative: 1.1 %
Eosinophils Absolute: 132 cells/uL (ref 15–500)
Eosinophils Relative: 2.8 %
HCT: 34.7 % — ABNORMAL LOW (ref 35.0–45.0)
Hemoglobin: 11.4 g/dL — ABNORMAL LOW (ref 11.7–15.5)
Lymphs Abs: 1401 cells/uL (ref 850–3900)
MCH: 30.4 pg (ref 27.0–33.0)
MCHC: 32.9 g/dL (ref 32.0–36.0)
MCV: 92.5 fL (ref 80.0–100.0)
MPV: 11.3 fL (ref 7.5–12.5)
Monocytes Relative: 7.7 %
Neutro Abs: 2754 cells/uL (ref 1500–7800)
Neutrophils Relative %: 58.6 %
Platelets: 200 10*3/uL (ref 140–400)
RBC: 3.75 10*6/uL — ABNORMAL LOW (ref 3.80–5.10)
RDW: 12.6 % (ref 11.0–15.0)
Total Lymphocyte: 29.8 %
WBC: 4.7 10*3/uL (ref 3.8–10.8)

## 2022-08-13 LAB — COMPLETE METABOLIC PANEL WITH GFR
AG Ratio: 1.8 (calc) (ref 1.0–2.5)
ALT: 19 U/L (ref 6–29)
AST: 21 U/L (ref 10–35)
Albumin: 4.1 g/dL (ref 3.6–5.1)
Alkaline phosphatase (APISO): 85 U/L (ref 37–153)
BUN/Creatinine Ratio: 37 (calc) — ABNORMAL HIGH (ref 6–22)
BUN: 21 mg/dL (ref 7–25)
CO2: 28 mmol/L (ref 20–32)
Calcium: 8.9 mg/dL (ref 8.6–10.4)
Chloride: 107 mmol/L (ref 98–110)
Creat: 0.57 mg/dL — ABNORMAL LOW (ref 0.60–0.95)
Globulin: 2.3 g/dL (calc) (ref 1.9–3.7)
Glucose, Bld: 91 mg/dL (ref 65–99)
Potassium: 3.7 mmol/L (ref 3.5–5.3)
Sodium: 142 mmol/L (ref 135–146)
Total Bilirubin: 0.5 mg/dL (ref 0.2–1.2)
Total Protein: 6.4 g/dL (ref 6.1–8.1)
eGFR: 91 mL/min/{1.73_m2} (ref >=60)

## 2022-08-13 LAB — LIPID PANEL
Cholesterol: 167 mg/dL (ref <200)
HDL: 50 mg/dL (ref >=50)
LDL Cholesterol (Calc): 106 mg/dL (calc) — ABNORMAL HIGH
Non-HDL Cholesterol (Calc): 117 mg/dL (calc) (ref <130)
Total CHOL/HDL Ratio: 3.3 (calc) (ref <5.0)
Triglycerides: 37 mg/dL (ref <150)

## 2022-08-13 LAB — INSULIN, RANDOM: Insulin: 2.1 u[IU]/mL

## 2022-08-23 ENCOUNTER — Other Ambulatory Visit: Payer: Self-pay | Admitting: Nurse Practitioner

## 2022-09-11 DIAGNOSIS — Z961 Presence of intraocular lens: Secondary | ICD-10-CM | POA: Diagnosis not present

## 2022-09-11 DIAGNOSIS — H5211 Myopia, right eye: Secondary | ICD-10-CM | POA: Diagnosis not present

## 2022-11-06 ENCOUNTER — Ambulatory Visit: Payer: Medicare HMO | Admitting: Nurse Practitioner

## 2022-11-10 ENCOUNTER — Ambulatory Visit: Payer: Medicare HMO | Admitting: Internal Medicine

## 2022-11-11 ENCOUNTER — Encounter: Payer: Self-pay | Admitting: Internal Medicine

## 2022-11-11 ENCOUNTER — Ambulatory Visit (INDEPENDENT_AMBULATORY_CARE_PROVIDER_SITE_OTHER): Payer: Medicare HMO | Admitting: Internal Medicine

## 2022-11-11 VITALS — BP 114/62 | HR 60 | Temp 97.3°F | Resp 16 | Ht 65.0 in | Wt 124.6 lb

## 2022-11-11 DIAGNOSIS — T466X5A Adverse effect of antihyperlipidemic and antiarteriosclerotic drugs, initial encounter: Secondary | ICD-10-CM | POA: Diagnosis not present

## 2022-11-11 DIAGNOSIS — E559 Vitamin D deficiency, unspecified: Secondary | ICD-10-CM

## 2022-11-11 DIAGNOSIS — E782 Mixed hyperlipidemia: Secondary | ICD-10-CM

## 2022-11-11 DIAGNOSIS — G720 Drug-induced myopathy: Secondary | ICD-10-CM | POA: Diagnosis not present

## 2022-11-11 DIAGNOSIS — Z79899 Other long term (current) drug therapy: Secondary | ICD-10-CM | POA: Diagnosis not present

## 2022-11-11 DIAGNOSIS — R7309 Other abnormal glucose: Secondary | ICD-10-CM

## 2022-11-11 DIAGNOSIS — R0989 Other specified symptoms and signs involving the circulatory and respiratory systems: Secondary | ICD-10-CM | POA: Diagnosis not present

## 2022-11-11 LAB — CBC WITH DIFFERENTIAL/PLATELET
HCT: 35.8 % (ref 35.0–45.0)
Lymphs Abs: 1293 cells/uL (ref 850–3900)
MCHC: 32.7 g/dL (ref 32.0–36.0)
MPV: 11.2 fL (ref 7.5–12.5)
Monocytes Relative: 8.4 %
Neutro Abs: 2778 cells/uL (ref 1500–7800)
Platelets: 204 10*3/uL (ref 140–400)
RBC: 3.8 10*6/uL (ref 3.80–5.10)
Total Lymphocyte: 28.1 %
WBC: 4.6 10*3/uL (ref 3.8–10.8)

## 2022-11-11 NOTE — Patient Instructions (Signed)

## 2022-11-11 NOTE — Progress Notes (Signed)
Future Appointments  Date Time Provider Department  11/11/2022                 6 mo  ov 11:30 AM Lucky Cowboy, MD [facility]  05/14/2023                  cpe  3:00 PM Lucky Cowboy, MD [facility]  11/11/2023                  10:00 AM Raynelle Dick, NP [facility]    History of Present Illness:       This very nice 82 y.o. WWF  presents for 6 month follow up with HTN, HLD, Pre-Diabetes and Vitamin D Deficiency.         Patient is followed expectantly  for HTN  & BP has been controlled at home. Today's BP is at goal  -  114/62. Patient has had no complaints of any cardiac type chest pain, palpitations, dyspnea Pollyann Kennedy /PND, dizziness, claudication  or dependent edema.        Patient has hx/o Statin intolerance and her hyperlipidemia is controlled with diet and ezetimibe /Gemfibrozil . Patient denies myalgias or other med SE's. Last Lipids were near goal :  Lab Results  Component Value Date   CHOL 167 08/12/2022   HDL 50 08/12/2022   LDLCALC 106 (H) 08/12/2022   TRIG 37 08/12/2022   CHOLHDL 3.3 08/12/2022     Also, the patient has history of T2_NIDDM PreDiabetes and has had no symptoms of reactive hypoglycemia, diabetic polys, paresthesias or visual blurring.  Last A1c was near goal :  Lab Results  Component Value Date   HGBA1C 5.7 (H) 05/08/2022                                                         Further, the patient also has history of Vitamin D Deficiency and supplements vitamin D . Last vitamin D was at goal :  Lab Results  Component Value Date   VD25OH 73 05/08/2022      Current Outpatient Medications on File Prior to Visit  Medication Sig   aspirin EC 81 MG tablet Take  daily   Calcium 1,200 -Vit D Take daily   VITAMIN B12 250 mcg Take  daily.   ezetimibe (ZETIA) 10 MG tablet Take 1 tablet  daily   gemfibrozil (LOPID) 600 MG tablet Take  1 tablet  2 x /day    ANUSOL-HC 2.5 % rectal cream Apply twice a day for 14 days   Multiple Vitamin   Take 1 tablet  daily.   Probiotic ALIGN Take daily.   VITAMIN D  Take 5000 IU QOD and 10,000 IU QOD   Zinc 25 MG TABS Take daily     Allergies  Allergen Reactions   Atorvastatin Other (See Comments)    myalgia   Fosamax [Alendronate Sodium] Other (See Comments)    Myalgia      PMHx:   Past Medical History:  Diagnosis Date   History of ITP    Hyperlipidemia    IBS (irritable bowel syndrome)    Osteopenia    Psoriasis       Immunization History  Administered Date(s) Administered   Fluad Quad(high Dose 65+) 02/15/2020   Influenza Split 01/11/2019  Influenza, High Dose Seasonal PF 01/25/2014, 01/22/2017, 01/03/2021   Influenza,inj,quad, With Preservative 01/19/2017   Influenza-Unspecified 12/21/2014, 01/22/2016, 01/18/2018   PFIZER Comirnaty(Gray Top)Covid-19 Tri-Sucrose Vaccine 01/29/2022   PFIZER(Purple Top)SARS-COV-2 Vaccination 05/11/2019, 06/01/2019, 02/01/2020   PPD Test 03/14/2013   Pfizer Covid-19 Vaccine Bivalent Booster 38yrs & up 12/25/2020   Pneumococcal Conjugate-13 12/05/2015   Pneumococcal Polysaccharide-23 02/08/2009, 06/30/2014   Respiratory Syncytial Virus Vaccine,Recomb Aduvanted(Arexvy) 02/28/2022   Td 02/08/2009   Tdap 12/02/2018   Zoster, Live 03/29/2009      Past Surgical History:  Procedure Laterality Date   ABDOMINAL HYSTERECTOMY     APPENDECTOMY     BREAST SURGERY Right    Biospy, benign   EYE SURGERY Bilateral    Cataracts   EYE SURGERY     Lense implants   TUBAL LIGATION       FHx:    Reviewed / unchanged   SHx:    Reviewed / unchanged    Systems Review:  Constitutional: Denies fever, chills, wt changes, headaches, insomnia, fatigue, night sweats, change in appetite. Eyes: Denies redness, blurred vision, diplopia, discharge, itchy, watery eyes.  ENT: Denies discharge, congestion, post nasal drip, epistaxis, sore throat, earache, hearing loss, dental pain, tinnitus, vertigo, sinus pain, snoring.  CV: Denies chest pain,  palpitations, irregular heartbeat, syncope, dyspnea, diaphoresis, orthopnea, PND, claudication or edema. Respiratory: denies cough, dyspnea, DOE, pleurisy, hoarseness, laryngitis, wheezing.  Gastrointestinal: Denies dysphagia, odynophagia, heartburn, reflux, water brash, abdominal pain or cramps, nausea, vomiting, bloating, diarrhea, constipation, hematemesis, melena, hematochezia  or hemorrhoids. Genitourinary: Denies dysuria, frequency, urgency, nocturia, hesitancy, discharge, hematuria or flank pain. Musculoskeletal: Denies arthralgias, myalgias, stiffness, jt. swelling, pain, limping or strain/sprain.  Skin: Denies pruritus, rash, hives, warts, acne, eczema or change in skin lesion(s). Neuro: No weakness, tremor, incoordination, spasms, paresthesia or pain. Psychiatric: Denies confusion, memory loss or sensory loss. Endo: Denies change in weight, skin or hair change.  Heme/Lymph: No excessive bleeding, bruising or enlarged lymph nodes.   Physical Exam  BP 114/62   Pulse 60   Temp (!) 97.3 F (36.3 C)   Resp 16   Ht 5\' 5"  (1.651 m)   Wt 124 lb 9.6 oz (56.5 kg)   SpO2 98%   BMI 20.73 kg/m   Appears  well nourished, well groomed  and in no distress.  Eyes: PERRLA, EOMs, conjunctiva no swelling or erythema. Sinuses: No frontal/maxillary tenderness ENT/Mouth: EAC's clear, TM's nl w/o erythema, bulging. Nares clear w/o erythema, swelling, exudates. Oropharynx clear without erythema or exudates. Oral hygiene is good. Tongue normal, non obstructing. Hearing intact.  Neck: Supple. Thyroid not palpable. Car 2+/2+ without bruits, nodes or JVD. Chest: Respirations nl with BS clear & equal w/o rales, rhonchi, wheezing or stridor.  Cor: Heart sounds normal w/ regular rate and rhythm without sig. murmurs, gallops, clicks or rubs. Peripheral pulses normal and equal  without edema.  Abdomen: Soft & bowel sounds normal. Non-tender w/o guarding, rebound, hernias, masses or organomegaly.   Lymphatics: Unremarkable.  Musculoskeletal: Full ROM all peripheral extremities, joint stability, 5/5 strength and normal gait.  Skin: Warm, dry without exposed rashes, lesions or ecchymosis apparent.  Neuro: Cranial nerves intact, reflexes equal bilaterally. Sensory-motor testing grossly intact. Tendon reflexes grossly intact.  Pysch: Alert & oriented x 3.  Insight and judgement nl & appropriate. No ideations.   Assessment and Plan:   1. Labile hypertension  - Continue medication, monitor blood pressure at home.  - Continue DASH diet.  Reminder to go to the ER if  any CP,  SOB, nausea, dizziness, severe HA, changes vision/speech.   - CBC with Differential/Platelet - COMPLETE METABOLIC PANEL WITH GFR - Magnesium - TSH   2. Hyperlipidemia, mixed  - Continue diet/meds, exercise,& lifestyle modifications.  - Continue monitor periodic cholesterol/liver & renal functions    - Lipid panel - TSH   3. Statin myopathy  - Lipid panel   4. Abnormal glucose  - Continue diet, exercise  - Lifestyle modifications.  - Monitor appropriate labs    - Hemoglobin A1c - Insulin, random   5. Vitamin D deficiency  - Continue supplementation    - VITAMIN D 25 Hydroxy    6. Medication management  - CBC with Differential/Platelet - COMPLETE METABOLIC PANEL WITH GFR - Magnesium - Lipid panel - TSH - Hemoglobin A1c - Insulin, random - VITAMIN D 25 Hydroxy           Discussed  regular exercise, BP monitoring, weight control to achieve/maintain BMI less than 25 and discussed med and SE's. Recommended labs to assess /monitor clinical status.  I discussed the assessment and treatment plan with the patient. The patient was provided an opportunity to ask questions and all were answered. The patient agreed with the plan and demonstrated an understanding of the instructions.  I provided over 30 minutes of exam, counseling, chart review and  complex critical decision making.        The  patient was advised to call back or seek an in-person evaluation if the symptoms worsen or if the condition fails to improve as anticipated.   Marinus Maw, MD

## 2022-11-12 LAB — LIPID PANEL
Cholesterol: 154 mg/dL (ref <200)
HDL: 50 mg/dL (ref >=50)
LDL Cholesterol (Calc): 92 mg/dL (calc)
Non-HDL Cholesterol (Calc): 104 mg/dL (calc) (ref <130)
Total CHOL/HDL Ratio: 3.1 (calc) (ref <5.0)
Triglycerides: 40 mg/dL (ref <150)

## 2022-11-12 LAB — TSH: TSH: 1.09 mIU/L (ref 0.40–4.50)

## 2022-11-12 LAB — COMPLETE METABOLIC PANEL WITH GFR
AG Ratio: 1.7 (calc) (ref 1.0–2.5)
ALT: 15 U/L (ref 6–29)
AST: 20 U/L (ref 10–35)
Albumin: 4 g/dL (ref 3.6–5.1)
Alkaline phosphatase (APISO): 87 U/L (ref 37–153)
BUN: 20 mg/dL (ref 7–25)
CO2: 27 mmol/L (ref 20–32)
Calcium: 9.3 mg/dL (ref 8.6–10.4)
Chloride: 108 mmol/L (ref 98–110)
Creat: 0.78 mg/dL (ref 0.60–0.95)
Globulin: 2.4 g/dL (calc) (ref 1.9–3.7)
Glucose, Bld: 85 mg/dL (ref 65–99)
Potassium: 4.1 mmol/L (ref 3.5–5.3)
Sodium: 142 mmol/L (ref 135–146)
Total Bilirubin: 0.6 mg/dL (ref 0.2–1.2)
Total Protein: 6.4 g/dL (ref 6.1–8.1)
eGFR: 75 mL/min/{1.73_m2} (ref >=60)

## 2022-11-12 LAB — MAGNESIUM: Magnesium: 2 mg/dL (ref 1.5–2.5)

## 2022-11-12 LAB — CBC WITH DIFFERENTIAL/PLATELET
Absolute Monocytes: 386 cells/uL (ref 200–950)
Basophils Absolute: 41 cells/uL (ref 0–200)
Basophils Relative: 0.9 %
Eosinophils Absolute: 101 cells/uL (ref 15–500)
Eosinophils Relative: 2.2 %
Hemoglobin: 11.7 g/dL (ref 11.7–15.5)
MCH: 30.8 pg (ref 27.0–33.0)
MCV: 94.2 fL (ref 80.0–100.0)
Neutrophils Relative %: 60.4 %
RDW: 12.3 % (ref 11.0–15.0)

## 2022-11-12 LAB — INSULIN, RANDOM: Insulin: 2.1 u[IU]/mL

## 2022-11-12 LAB — VITAMIN D 25 HYDROXY (VIT D DEFICIENCY, FRACTURES): Vit D, 25-Hydroxy: 68 ng/mL (ref 30–100)

## 2022-11-12 NOTE — Progress Notes (Signed)
^<^<^<^<^<^<^<^<^<^<^<^<^<^<^<^<^<^<^<^<^<^<^<^<^<^<^<^<^<^<^<^<^<^<^<^<^ ^>^>^>^>^>^>^>^>^>^>^>>^>^>^>^>^>^>^>^>^>^>^>^>^>^>^>^>^>^>^>^>^>^>^>^>^>  -  Test results slightly outside the reference range are not unusual. If there is anything important, I will review this with you,  otherwise it is considered normal test values.  If you have further questions,  please do not hesitate to contact me at the office or via My Chart.   ^<^<^<^<^<^<^<^<^<^<^<^<^<^<^<^<^<^<^<^<^<^<^<^<^<^<^<^<^<^<^<^<^<^<^<^<^ ^>^>^>^>^>^>^>^>^>^>^>^>^>^>^>^>^>^>^>^>^>^>^>^>^>^>^>^>^>^>^>^>^>^>^>^>^  -  Chol = 154   &  LDL = 92  - Both   Excellent   - Very low risk for Heart Attack  / Stroke  ^>^>^>^>^>^>^>^>^>^>^>^>^>^>^>^>^>^>^>^>^>^>^>^>^>^>^>^>^>^>^>^>^>^>^>^>^ ^>^>^>^>^>^>^>^>^>^>^>^>^>^>^>^>^>^>^>^>^>^>^>^>^>^>^>^>^>^>^>^>^>^>^>^>^  -   Vitamin D = 68 - Great - Please keep dose same   ^>^>^>^>^>^>^>^>^>^>^>^>^>^>^>^>^>^>^>^>^>^>^>^>^>^>^>^>^>^>^>^>^>^>^>^>^ ^>^>^>^>^>^>^>^>^>^>^>^>^>^>^>^>^>^>^>^>^>^>^>^>^>^>^>^>^>^>^>^>^>^>^>^>^  -  All Else - CBC - Kidneys - Electrolytes - Liver - Magnesium & Thyroid    - all  Normal /   ^>^>^>^>^>^>^>^>^>^>^>^>^>^>^>^>^>^>^>^>^>^>^>^>^>^>^>^>^>^>^>^>^>^>^>^>^ ^>^>^>^>^>^>^>^>^>^>^>^>^>^>^>^>^>^>^>^>^>^>^>^>^>^>^>^>^>^>^>^>^>^>^>^>^

## 2022-11-17 DIAGNOSIS — D220 Melanocytic nevi of lip: Secondary | ICD-10-CM | POA: Diagnosis not present

## 2022-11-17 DIAGNOSIS — L821 Other seborrheic keratosis: Secondary | ICD-10-CM | POA: Diagnosis not present

## 2022-11-17 DIAGNOSIS — D2271 Melanocytic nevi of right lower limb, including hip: Secondary | ICD-10-CM | POA: Diagnosis not present

## 2022-11-17 DIAGNOSIS — D2262 Melanocytic nevi of left upper limb, including shoulder: Secondary | ICD-10-CM | POA: Diagnosis not present

## 2022-11-17 DIAGNOSIS — D225 Melanocytic nevi of trunk: Secondary | ICD-10-CM | POA: Diagnosis not present

## 2022-11-17 DIAGNOSIS — L570 Actinic keratosis: Secondary | ICD-10-CM | POA: Diagnosis not present

## 2022-11-17 DIAGNOSIS — L400 Psoriasis vulgaris: Secondary | ICD-10-CM | POA: Diagnosis not present

## 2022-12-24 ENCOUNTER — Other Ambulatory Visit: Payer: Self-pay | Admitting: Internal Medicine

## 2022-12-24 DIAGNOSIS — Z1231 Encounter for screening mammogram for malignant neoplasm of breast: Secondary | ICD-10-CM

## 2022-12-26 ENCOUNTER — Other Ambulatory Visit: Payer: Self-pay | Admitting: Nurse Practitioner

## 2023-02-03 ENCOUNTER — Ambulatory Visit
Admission: RE | Admit: 2023-02-03 | Discharge: 2023-02-03 | Disposition: A | Discharge status: Home / Self Care | Payer: Medicare HMO | Source: Ambulatory Visit | Attending: Internal Medicine | Admitting: Internal Medicine

## 2023-02-03 ENCOUNTER — Ambulatory Visit: Payer: Medicare HMO

## 2023-02-03 DIAGNOSIS — Z1231 Encounter for screening mammogram for malignant neoplasm of breast: Secondary | ICD-10-CM | POA: Diagnosis not present

## 2023-02-11 NOTE — Progress Notes (Unsigned)
Patient ID: Madison Gonzalez, female   DOB: 09-Jun-1940, 82 y.o.   MRN: [id number]  3 MONTH FOLLOW UP  Assessment:    Irritable bowel syndrome, unspecified type Controlled at this time without flare, improved on align probiotic   Vitamin D deficiency At goal at recent check; continue to recommend supplementation for goal of 60  Abnormal glucose  Discussed disease and risks Discussed diet/exercise, weight management  Check A1C q43m; CMP for glucose otherwise  Hyperlipidemia Newlyl back on zetia, holding gemfibrozil - would like to stop gemfibrozil if possible LDL goal <100 Hx of statin intolerance, declines further Continue low cholesterol diet and exercise.  Check lipid panel.   Elevated BP w/o Dx HTN Monitor blood pressure at home; call if consistently over 130/80 Continue DASH diet.   Reminder to go to the ER if any CP, SOB, nausea, dizziness, severe HA, changes vision/speech, left arm numbness and tingling and jaw pain.  Body mass index (BMI) of 23 Continue to recommend diet heavy in fruits and veggies and low in animal meats, cheeses, and dairy products, appropriate calorie intake Discuss exercise recommendations routinely Continue to monitor weight at each visit   Avascular necrosis of hip, left (HCC) Has seen ortho; monitoring only; hip replacement if progressive/limiting pain Denies any sx at this time     Over 30 minutes of exam, counseling, chart review, and critical decision making was performed  Future Appointments  Date Time Provider Department Center  02/12/2023  9:45 AM Raynelle Dick, NP [facility] None  05/14/2023  3:00 PM Lucky Cowboy, MD [facility] None  08/17/2023  9:30 AM Raynelle Dick, NP [facility] None    Subjective:   Madison Gonzalez is a 82 y.o. female who presents for 3 month follow up on hypertension, glucose management, hyperlipidemia, vitamin D def.   [place], can [place] lake, just  resealed her driveway, stays very active on her land.   She has L hip avascular necrosis per xray 12/2018 obtained for pelvic pain, saw Dr. Turner Daniels who recommended monitoring, if progressive/severe pain would have hip replacement. She also has osteoporosis with alendronate intolerance and was referred to Dr. Elvera Lennox for further management, discussed prolia/teriparatide but ultimately declined. She is taking calcium, vitamin D.   BMI is There is no height or weight on file to calculate BMI., she has been working on diet and exercise, very active around her yard, lives on [place] acres which she cares for by herself. Started a yoga/ Tai Yoga course and enjoying this.  Wt Readings from Last 3 Encounters:  11/11/22 124 lb 9.6 oz (56.5 kg)  08/12/22 127 lb 6.4 oz (57.8 kg)  05/08/22 125 lb 3.2 oz (56.8 kg)   Her blood pressure has been controlled at home, today their BP is   She does not workout but very active. She denies chest pain, shortness of breath, dizziness.   She is on cholesterol medication (zetia, lopid - was off of meds in Jan, restarted zetia, off of gemfibrozil) and denies myalgias. Hx of myalgia with statins. Declines further, fairly benign hx (father than congenital heart issues). Her cholesterol is at goal. The cholesterol last visit was:   Lab Results  Component Value Date   CHOL 154 11/11/2022   HDL 50 11/11/2022   LDLCALC 92 11/11/2022   TRIG 40 11/11/2022   CHOLHDL 3.1 11/11/2022   She has been working on diet and exercise for glucose management, and denies foot ulcerations, hyperglycemia,  hypoglycemia , increased appetite, nausea, paresthesia of the feet, polydipsia, polyuria, visual disturbances, vomiting and weight loss. Last A1C in the office was:  Lab Results  Component Value Date   HGBA1C 5.7 (H) 05/08/2022   Last GFR Lab Results  Component Value Date   EGFR 75 11/11/2022   Patient is on Vitamin D supplement. Lab Results  Component Value Date   VD25OH 68 11/11/2022       Medication Review Current Outpatient Medications on File Prior to Visit  Medication Sig Dispense Refill   aspirin EC 81 MG tablet Take 81 mg by mouth daily. Swallow whole.     Calcium Carbonate-Vit D-Min (CALCIUM 1200 PO) Take by mouth.     cyanocobalamin (VITAMIN B12) 250 MCG tablet Take 250 mcg by mouth daily.     ezetimibe (ZETIA) 10 MG tablet Take 1 tablet by mouth once daily 90 tablet 0   gemfibrozil (LOPID) 600 MG tablet Take  1 tablet  2 x /day  with Meals  for Cholesterol        TAKE 1 TABLET BY MOUTH TWICE DAILY WITH A MEAL FOR CHOLESTEROL 180 tablet 2   hydrocortisone (ANUSOL-HC) 2.5 % rectal cream 1 application Externally Twice a day for 14 days     Multiple Vitamin (MULTIVITAMIN) tablet Take 1 tablet by mouth daily.     Probiotic Product (ALIGN PO) Take by mouth daily.     VITAMIN D PO Take by mouth. Take 5000 IU QOD and 10,000 IU QOD     Zinc 25 MG TABS Take by mouth.     No current facility-administered medications on file prior to visit.    Current Problems (verified) Patient Active Problem List   Diagnosis Date Noted   B12 deficiency 10/05/2021   Positive colorectal cancer screening using Cologuard test 12/05/2020   Statin myopathy 04/19/2020   Aseptic necrosis of bone of hip, left (HCC) 10/03/2019   FHx: heart disease 02/23/2018   BMI 22.0-22.9, adult 03/31/2015   Abnormal glucose (prediabetes) 03/29/2015   Elevated BP w/o Dx HTN 06/29/2013   Hyperlipidemia, mixed 06/29/2013   Vitamin D deficiency 06/29/2013   IBS (irritable bowel syndrome)    Psoriasis    Osteoporosis    History of ITP     Past Surgical History:  Procedure Laterality Date   ABDOMINAL HYSTERECTOMY     APPENDECTOMY     BREAST SURGERY Right    Biospy, benign   EYE SURGERY Bilateral    Cataracts   EYE SURGERY     Lense implants   TUBAL LIGATION     Family History  Problem Relation Age of Onset   AAA (abdominal aortic aneurysm) Mother    Hypertension Mother    Hypertension Father     Heart disease Father    Hyperlipidemia Father    Cancer Maternal Aunt        uterine   Social History   Tobacco Use   Smoking status: Never   Smokeless tobacco: Never  Substance Use Topics   Alcohol use: Yes    Comment: occasional wine 1-2 drinks   Drug use: No    Review of Systems  Constitutional:  Negative for malaise/fatigue and weight loss.  HENT:  Negative for hearing loss and tinnitus.   Eyes:  Negative for blurred vision and double vision.  Respiratory:  Negative for cough, shortness of breath and wheezing.   Cardiovascular:  Negative for chest pain, palpitations, orthopnea, claudication and leg swelling.  Gastrointestinal:  Negative for  abdominal pain, blood in stool, constipation, diarrhea, heartburn, melena, nausea and vomiting.  Genitourinary: Negative.   Musculoskeletal:  Negative for joint pain and myalgias.  Skin:  Negative for rash.  Neurological:  Negative for dizziness, tingling, sensory change, weakness and headaches.  Endo/Heme/Allergies:  Negative for polydipsia.  Psychiatric/Behavioral: Negative.    All other systems reviewed and are negative.      Objective:   There were no vitals filed for this visit.    There is no height or weight on file to calculate BMI.  General appearance: alert, no distress, WD/WN,  female HEENT: normocephalic, sclerae anicteric, TMs pearly, nares patent, no discharge or erythema, pharynx normal Oral cavity: MMM, no lesions Neck: supple, no lymphadenopathy, no thyromegaly, no masses Heart: RRR, normal S1, S2, no murmurs Lungs: CTA bilaterally, no wheezes, rhonchi, or rales Abdomen: +bs, soft, non tender, non distended, no masses, no hepatomegaly, no splenomegaly Musculoskeletal: nontender, no swelling, no obvious deformity Extremities: no edema, no cyanosis, no clubbing Pulses: 2+ symmetric, upper and lower extremities, normal cap refill Neurological: alert, oriented x 3, CN2-12 intact, strength normal upper  extremities and lower extremities, sensation normal throughout, DTRs 2+ throughout, no cerebellar signs, gait normal Psychiatric: normal affect, behavior normal, pleasant   Raynelle Dick, NP   02/11/2023

## 2023-02-12 ENCOUNTER — Ambulatory Visit (INDEPENDENT_AMBULATORY_CARE_PROVIDER_SITE_OTHER): Payer: Medicare HMO | Admitting: Nurse Practitioner

## 2023-02-12 ENCOUNTER — Encounter: Payer: Self-pay | Admitting: Nurse Practitioner

## 2023-02-12 VITALS — BP 134/68 | HR 66 | Temp 97.5°F | Ht 65.0 in | Wt 123.0 lb

## 2023-02-12 DIAGNOSIS — R030 Elevated blood-pressure reading, without diagnosis of hypertension: Secondary | ICD-10-CM | POA: Diagnosis not present

## 2023-02-12 DIAGNOSIS — E559 Vitamin D deficiency, unspecified: Secondary | ICD-10-CM | POA: Diagnosis not present

## 2023-02-12 DIAGNOSIS — R7309 Other abnormal glucose: Secondary | ICD-10-CM | POA: Diagnosis not present

## 2023-02-12 DIAGNOSIS — E782 Mixed hyperlipidemia: Secondary | ICD-10-CM | POA: Diagnosis not present

## 2023-02-12 DIAGNOSIS — K121 Other forms of stomatitis: Secondary | ICD-10-CM

## 2023-02-12 DIAGNOSIS — Z79899 Other long term (current) drug therapy: Secondary | ICD-10-CM | POA: Diagnosis not present

## 2023-02-12 DIAGNOSIS — Z6820 Body mass index (BMI) 20.0-20.9, adult: Secondary | ICD-10-CM

## 2023-02-12 DIAGNOSIS — G720 Drug-induced myopathy: Secondary | ICD-10-CM

## 2023-02-12 DIAGNOSIS — M87052 Idiopathic aseptic necrosis of left femur: Secondary | ICD-10-CM

## 2023-02-12 DIAGNOSIS — K589 Irritable bowel syndrome without diarrhea: Secondary | ICD-10-CM

## 2023-02-12 NOTE — Patient Instructions (Signed)

## 2023-02-13 LAB — LIPID PANEL
Cholesterol: 175 mg/dL (ref <200)
HDL: 59 mg/dL (ref >=50)
LDL Cholesterol (Calc): 107 mg/dL — ABNORMAL HIGH
Non-HDL Cholesterol (Calc): 116 mg/dL (ref <130)
Total CHOL/HDL Ratio: 3 (calc) (ref <5.0)
Triglycerides: 32 mg/dL (ref <150)

## 2023-02-13 LAB — CBC WITH DIFFERENTIAL/PLATELET
Absolute Lymphocytes: 1634 {cells}/uL (ref 850–3900)
Absolute Monocytes: 462 {cells}/uL (ref 200–950)
Basophils Absolute: 83 {cells}/uL (ref 0–200)
Basophils Relative: 1.5 %
Eosinophils Absolute: 160 {cells}/uL (ref 15–500)
Eosinophils Relative: 2.9 %
HCT: 37.4 % (ref 35.0–45.0)
Hemoglobin: 12.1 g/dL (ref 11.7–15.5)
MCH: 30.6 pg (ref 27.0–33.0)
MCHC: 32.4 g/dL (ref 32.0–36.0)
MCV: 94.7 fL (ref 80.0–100.0)
MPV: 11 fL (ref 7.5–12.5)
Monocytes Relative: 8.4 %
Neutro Abs: 3163 {cells}/uL (ref 1500–7800)
Neutrophils Relative %: 57.5 %
Platelets: 249 10*3/uL (ref 140–400)
RBC: 3.95 10*6/uL (ref 3.80–5.10)
RDW: 12.8 % (ref 11.0–15.0)
Total Lymphocyte: 29.7 %
WBC: 5.5 10*3/uL (ref 3.8–10.8)

## 2023-02-13 LAB — COMPLETE METABOLIC PANEL WITH GFR
AG Ratio: 1.3 (calc) (ref 1.0–2.5)
ALT: 16 U/L (ref 6–29)
AST: 20 U/L (ref 10–35)
Albumin: 4 g/dL (ref 3.6–5.1)
Alkaline phosphatase (APISO): 94 U/L (ref 37–153)
BUN/Creatinine Ratio: 36 (calc) — ABNORMAL HIGH (ref 6–22)
BUN: 27 mg/dL — ABNORMAL HIGH (ref 7–25)
CO2: 30 mmol/L (ref 20–32)
Calcium: 9.7 mg/dL (ref 8.6–10.4)
Chloride: 104 mmol/L (ref 98–110)
Creat: 0.74 mg/dL (ref 0.60–0.95)
Globulin: 3.1 g/dL (ref 1.9–3.7)
Glucose, Bld: 88 mg/dL (ref 65–99)
Potassium: 4.3 mmol/L (ref 3.5–5.3)
Sodium: 142 mmol/L (ref 135–146)
Total Bilirubin: 0.6 mg/dL (ref 0.2–1.2)
Total Protein: 7.1 g/dL (ref 6.1–8.1)
eGFR: 80 mL/min/{1.73_m2} (ref >=60)

## 2023-02-13 LAB — MAGNESIUM: Magnesium: 2.1 mg/dL (ref 1.5–2.5)

## 2023-02-13 LAB — TSH: TSH: 1.3 m[IU]/L (ref 0.40–4.50)

## 2023-03-06 ENCOUNTER — Other Ambulatory Visit: Payer: Self-pay | Admitting: Nurse Practitioner

## 2023-03-06 DIAGNOSIS — E782 Mixed hyperlipidemia: Secondary | ICD-10-CM

## 2023-04-03 ENCOUNTER — Other Ambulatory Visit: Payer: Self-pay | Admitting: Nurse Practitioner

## 2023-05-14 ENCOUNTER — Encounter: Payer: Medicare HMO | Admitting: Internal Medicine

## 2023-05-29 ENCOUNTER — Encounter: Payer: Medicare HMO | Admitting: Internal Medicine

## 2023-06-04 ENCOUNTER — Telehealth: Payer: Self-pay

## 2023-06-04 NOTE — Telephone Encounter (Signed)
Copied from CRM [id number]. Topic: Clinical - Medication Refill >> Jun 04, 2023  2:18 PM [place] E wrote: Most Recent Primary Care Visit:   Medication: ezetimibe (ZETIA) 10 MG tablet  Has the patient contacted their pharmacy? No (Agent: If no, request that the patient contact the pharmacy for the refill. If patient does not wish to contact the pharmacy document the reason why and proceed with request.) (Agent: If yes, when and what did the pharmacy advise?)  Is this the correct pharmacy for this prescription? Yes If no, delete pharmacy and type the correct one.  This is the patient's preferred pharmacy:  [facility] [address], [place] - [address] [place] [address] Phone: [phone] Fax: [phone]   Has the prescription been filled recently? Yes, patient still has about a 30 day supply.  Is the patient out of the medication? No  Has the patient been seen for an appointment in the last year OR does the patient have an upcoming appointment? Yes, patient has a new patient appointment scheduled with Dr. Nira Conn in April, but she will be running out of this medication next month, in March. She was a previous patient of Dr. Oneta Rack who has passed away.  Can we respond through MyChart? No, patient prefers a phone call.  Agent: Please be advised that Rx refills may take up to 3 business days. We ask that you follow-up with your pharmacy.

## 2023-06-24 ENCOUNTER — Other Ambulatory Visit: Payer: Self-pay

## 2023-06-24 MED ORDER — EZETIMIBE 10 MG PO TABS: 10.0000 mg | ORAL_TABLET | Freq: Every day | ORAL | 0 refills | Status: DC

## 2023-08-17 ENCOUNTER — Ambulatory Visit: Payer: Medicare HMO | Admitting: Nurse Practitioner

## 2023-08-19 ENCOUNTER — Ambulatory Visit: Payer: Medicare HMO | Admitting: Family Medicine

## 2023-08-19 ENCOUNTER — Encounter: Payer: Self-pay | Admitting: Family Medicine

## 2023-08-19 VITALS — BP 128/71 | HR 80 | Temp 97.8°F | Ht 64.0 in | Wt 126.2 lb

## 2023-08-19 DIAGNOSIS — G720 Drug-induced myopathy: Secondary | ICD-10-CM | POA: Diagnosis not present

## 2023-08-19 DIAGNOSIS — E782 Mixed hyperlipidemia: Secondary | ICD-10-CM

## 2023-08-19 DIAGNOSIS — R030 Elevated blood-pressure reading, without diagnosis of hypertension: Secondary | ICD-10-CM

## 2023-08-19 DIAGNOSIS — T466X5A Adverse effect of antihyperlipidemic and antiarteriosclerotic drugs, initial encounter: Secondary | ICD-10-CM

## 2023-08-19 DIAGNOSIS — R7309 Other abnormal glucose: Secondary | ICD-10-CM

## 2023-08-19 DIAGNOSIS — M810 Age-related osteoporosis without current pathological fracture: Secondary | ICD-10-CM

## 2023-08-19 NOTE — Progress Notes (Signed)
 New Patient Office Visit  Subjective   Patient ID: Madison Gonzalez, female    DOB: 06-03-1940  Age: 83 y.o. MRN: [id number]  CC:  Chief Complaint  Patient presents with   Establish Care    HPI [facility] presents to establish care Pt was previously seen by Dr. Cassondra Cliff who recently passed away.   Pt reports a history of HLD, cannot take statin therapy due to muscle aches and weakness. States she is on lopid  zetia , does well on these medications. Recently had blood work and LDL was 107. No side effects reported to the medication, labs reviewed  Elevated blood pressure -- pt reports that she BP goes up when she goes to the Dr. Donnita Gales that this happens to her every time, then it gets better towards the end of the visit. BP recheck in office was 128/71 at the end of the visit. She reports her bp readings at home are normal.   Osteoporosis-- pt had adv rxn to fosamax in the past, states she takes vitamin D  and calcium  supplements, saw Dr. Aldona Amel in 2020 and was offered Prolia however she declined this medication. States that she exercises regularly and tries to eat healthy things. Reviewed DEXA scan and notes from Dr. Aldona Amel.   Elevated blood glucose-- pt has had slightly elevated A1C's in the past, she denies any changes in eating habits, is trying to reduce sugar in her diet.   I have reviewed all aspects of the patient's medical history including social, family, and surgical history.   Current Outpatient Medications  Medication Instructions   aspirin EC 81 mg, Daily   Calcium  Carbonate-Vit D-Min (CALCIUM  1200 PO) Take by mouth.   cyanocobalamin (VITAMIN B12) 250 mcg, Daily   ezetimibe  (ZETIA ) 10 mg, Oral, Daily   gemfibrozil  (LOPID ) 600 MG tablet TAKE 1 TABLET BY MOUTH TWICE DAILY WITH MEALS FOR CHOLESTEROL   Multiple Vitamin (MULTIVITAMIN) tablet 1 tablet, Daily   Probiotic Product (ALIGN PO) Daily   VITAMIN D  PO Take by mouth. Take 5000 IU QOD and 10,000 IU  QOD   Zinc 25 MG TABS Take by mouth.    Past Medical History:  Diagnosis Date   History of ITP    Hyperlipidemia    IBS (irritable bowel syndrome)    Osteopenia    Psoriasis     Past Surgical History:  Procedure Laterality Date   ABDOMINAL HYSTERECTOMY     APPENDECTOMY     BREAST SURGERY Right    Biospy, benign   EYE SURGERY Bilateral    Cataracts   EYE SURGERY     Lense implants   TUBAL LIGATION      Family History  Problem Relation Age of Onset   AAA (abdominal aortic aneurysm) Mother    Hypertension Mother    Hypertension Father    Heart disease Father    Hyperlipidemia Father    Cancer Maternal Aunt        uterine    Social History   Socioeconomic History   Marital status: Widowed    Spouse name: Not on file   Number of children: 3   Years of education: Not on file   Highest education level: Not on file  Occupational History   Occupation: retired Programmer, systems  Tobacco Use   Smoking status: Never   Smokeless tobacco: Never  Substance and Sexual Activity   Alcohol use: Yes    Comment: occasional wine 1-2 drinks   Drug use: No   Sexual  activity: Never  Other Topics Concern   Not on file  Social History Narrative   Not on file   Social Drivers of Health   Financial Resource Strain: Not on file  Food Insecurity: Not on file  Transportation Needs: Not on file  Physical Activity: Not on file  Stress: Not on file  Social Connections: Not on file  Intimate Partner Violence: Not on file    Review of Systems  All other systems reviewed and are negative.       Objective   BP 128/71 (BP Location: Right Arm, Patient Position: Sitting, Cuff Size: Normal)   Pulse 80   Temp 97.8 F (36.6 C) (Oral)   Ht 5\' 4"  (1.626 m)   Wt 126 lb 3.2 oz (57.2 kg)   SpO2 98%   BMI 21.66 kg/m   Physical Exam Vitals reviewed.  Constitutional:      Appearance: Normal appearance. She is well-groomed and normal weight.  Eyes:     Conjunctiva/sclera: Conjunctivae  normal.  Neck:     Thyroid : No thyromegaly.  Cardiovascular:     Rate and Rhythm: Normal rate and regular rhythm.     Pulses: Normal pulses.     Heart sounds: S1 normal and S2 normal.  Pulmonary:     Effort: Pulmonary effort is normal.     Breath sounds: Normal breath sounds and air entry.  Abdominal:     General: Bowel sounds are normal.  Musculoskeletal:     Right lower leg: No edema.     Left lower leg: No edema.  Neurological:     Mental Status: She is alert and oriented to person, place, and time. Mental status is at baseline.     Gait: Gait is intact.  Psychiatric:        Mood and Affect: Mood and affect normal.        Speech: Speech normal.        Behavior: Behavior normal.        Judgment: Judgment normal.         Assessment & Plan:  Hyperlipidemia, mixed Assessment & Plan: On ezetimibe  10 mg daily and gemfibrozil  600 mg BID, will continue these medications as prescribed. We discussed cardiac risk over time .  Orders: -     Lipid panel; Future -     Comprehensive metabolic panel with GFR; Future  Statin myopathy  Elevated BP w/o Dx HTN Assessment & Plan: Repeat BP in office was WNL, will continue to monitor, does not require medication at this time.   Age-related osteoporosis without current pathological fracture Assessment & Plan: Counseled patient on the risk of osteoporotic fracture and the use of Prolia, pt states she does not wish to start the medication at this time. Will not order any further DEXA scans at this time.   Orders: -     VITAMIN D  25 Hydroxy (Vit-D Deficiency, Fractures); Future  Abnormal glucose (prediabetes) Assessment & Plan: Reviewed last set of labs, will recheck A1C again in 6 months   Orders: -     Hemoglobin A1c; Future    Return in about 6 months (around 02/18/2024) for annual physical exam.   Aida House, MD

## 2023-08-19 NOTE — Assessment & Plan Note (Signed)
 Reviewed last set of labs, will recheck A1C again in 6 months

## 2023-08-19 NOTE — Assessment & Plan Note (Signed)
 Counseled patient on the risk of osteoporotic fracture and the use of Prolia, pt states she does not wish to start the medication at this time. Will not order any further DEXA scans at this time.

## 2023-08-19 NOTE — Assessment & Plan Note (Signed)
 Repeat BP in office was WNL, will continue to monitor, does not require medication at this time.

## 2023-08-19 NOTE — Assessment & Plan Note (Signed)
 On ezetimibe  10 mg daily and gemfibrozil  600 mg BID, will continue these medications as prescribed. We discussed cardiac risk over time .

## 2023-09-07 ENCOUNTER — Ambulatory Visit: Payer: Medicare HMO | Admitting: Nurse Practitioner

## 2023-09-16 DIAGNOSIS — H04122 Dry eye syndrome of left lacrimal gland: Secondary | ICD-10-CM | POA: Diagnosis not present

## 2023-09-16 DIAGNOSIS — Z961 Presence of intraocular lens: Secondary | ICD-10-CM | POA: Diagnosis not present

## 2023-09-16 DIAGNOSIS — H52203 Unspecified astigmatism, bilateral: Secondary | ICD-10-CM | POA: Diagnosis not present

## 2023-10-28 ENCOUNTER — Other Ambulatory Visit: Payer: Self-pay | Admitting: Family

## 2023-11-03 ENCOUNTER — Telehealth: Payer: Self-pay | Admitting: *Deleted

## 2023-11-03 DIAGNOSIS — E782 Mixed hyperlipidemia: Secondary | ICD-10-CM

## 2023-11-03 MED ORDER — EZETIMIBE 10 MG PO TABS: 10.0000 mg | ORAL_TABLET | Freq: Every day | ORAL | 1 refills | Status: DC

## 2023-11-03 NOTE — Telephone Encounter (Signed)
 Copied from CRM [id number]. Topic: Clinical - Medication Question >> Nov 03, 2023  2:36 PM Burnard DEL wrote: Reason for CRM: Patient called in stating that the pharmacy has sent over several refill request  for medication ezetimibe  (ZETIA ) 10 MG tablet and has not got a response back from provider.After her PCP passed away she had medication filled I'm March by Sheela Roys. Patient is now under the care of Ora Bollig.Patient would like to know if she could have this medication filled?

## 2023-11-03 NOTE — Telephone Encounter (Signed)
 Patient informed of the message below.

## 2023-11-03 NOTE — Telephone Encounter (Signed)
 Script sent-- I do not see any zetia  requests in the computer -- they may have been sending them to the wrong provider

## 2023-11-11 ENCOUNTER — Ambulatory Visit: Payer: Medicare HMO | Admitting: Nurse Practitioner

## 2023-11-18 DIAGNOSIS — D225 Melanocytic nevi of trunk: Secondary | ICD-10-CM | POA: Diagnosis not present

## 2023-11-18 DIAGNOSIS — D2262 Melanocytic nevi of left upper limb, including shoulder: Secondary | ICD-10-CM | POA: Diagnosis not present

## 2023-11-18 DIAGNOSIS — L821 Other seborrheic keratosis: Secondary | ICD-10-CM | POA: Diagnosis not present

## 2023-11-18 DIAGNOSIS — D2261 Melanocytic nevi of right upper limb, including shoulder: Secondary | ICD-10-CM | POA: Diagnosis not present

## 2023-11-18 DIAGNOSIS — L400 Psoriasis vulgaris: Secondary | ICD-10-CM | POA: Diagnosis not present

## 2024-01-08 ENCOUNTER — Other Ambulatory Visit: Payer: Self-pay | Admitting: Family Medicine

## 2024-01-08 DIAGNOSIS — Z1231 Encounter for screening mammogram for malignant neoplasm of breast: Secondary | ICD-10-CM

## 2024-02-10 ENCOUNTER — Ambulatory Visit
Admission: RE | Admit: 2024-02-10 | Discharge: 2024-02-10 | Disposition: A | Source: Ambulatory Visit | Attending: Family Medicine

## 2024-02-10 DIAGNOSIS — Z1231 Encounter for screening mammogram for malignant neoplasm of breast: Secondary | ICD-10-CM | POA: Diagnosis not present

## 2024-02-15 ENCOUNTER — Other Ambulatory Visit

## 2024-02-15 ENCOUNTER — Ambulatory Visit: Payer: Self-pay | Admitting: Family Medicine

## 2024-02-15 DIAGNOSIS — R7309 Other abnormal glucose: Secondary | ICD-10-CM

## 2024-02-15 DIAGNOSIS — M810 Age-related osteoporosis without current pathological fracture: Secondary | ICD-10-CM | POA: Diagnosis not present

## 2024-02-15 DIAGNOSIS — E782 Mixed hyperlipidemia: Secondary | ICD-10-CM

## 2024-02-15 LAB — COMPREHENSIVE METABOLIC PANEL WITH GFR
ALT: 18 U/L (ref 0–35)
AST: 26 U/L (ref 0–37)
Albumin: 4 g/dL (ref 3.5–5.2)
Alkaline Phosphatase: 90 U/L (ref 39–117)
BUN: 18 mg/dL (ref 6–23)
CO2: 27 meq/L (ref 19–32)
Calcium: 9.1 mg/dL (ref 8.4–10.5)
Chloride: 104 meq/L (ref 96–112)
Creatinine, Ser: 0.59 mg/dL (ref 0.40–1.20)
GFR: 82.72 mL/min (ref >60.00)
Glucose, Bld: 88 mg/dL (ref 70–99)
Potassium: 4 meq/L (ref 3.5–5.1)
Sodium: 140 meq/L (ref 135–145)
Total Bilirubin: 0.7 mg/dL (ref 0.2–1.2)
Total Protein: 6.8 g/dL (ref 6.0–8.3)

## 2024-02-15 LAB — LIPID PANEL
Cholesterol: 157 mg/dL (ref 0–200)
HDL: 45.1 mg/dL (ref >39.00)
LDL Cholesterol: 104 mg/dL — ABNORMAL HIGH (ref 0–99)
NonHDL: 111.64
Total CHOL/HDL Ratio: 3
Triglycerides: 36 mg/dL (ref 0.0–149.0)
VLDL: 7.2 mg/dL (ref 0.0–40.0)

## 2024-02-15 LAB — HEMOGLOBIN A1C: Hgb A1c MFr Bld: 5.9 % (ref 4.6–6.5)

## 2024-02-15 LAB — VITAMIN D 25 HYDROXY (VIT D DEFICIENCY, FRACTURES): VITD: 79.25 ng/mL (ref 30.00–100.00)

## 2024-02-18 ENCOUNTER — Ambulatory Visit: Admitting: Family Medicine

## 2024-02-18 ENCOUNTER — Encounter: Payer: Self-pay | Admitting: Family Medicine

## 2024-02-18 VITALS — BP 140/90 | HR 70 | Temp 97.6°F | Ht 64.0 in | Wt 123.6 lb

## 2024-02-18 DIAGNOSIS — E782 Mixed hyperlipidemia: Secondary | ICD-10-CM | POA: Diagnosis not present

## 2024-02-18 DIAGNOSIS — Z0000 Encounter for general adult medical examination without abnormal findings: Secondary | ICD-10-CM

## 2024-02-18 MED ORDER — GEMFIBROZIL 600 MG PO TABS: ORAL_TABLET | ORAL | 2 refills | Status: AC

## 2024-02-18 NOTE — Progress Notes (Unsigned)
 Subjective:   Madison Gonzalez is a 83 y.o. female who presents for Medicare Annual (Subsequent) preventive examination.  Visit Complete: In person  Patient Medicare AWV questionnaire was completed by the patient on 02/18/2024; I have confirmed that all information answered by patient is correct and no changes since this date.  Cardiac Risk Factors include: advanced age (>66men, >34 women);dyslipidemia     Objective:    Today's Vitals   02/18/24 0852  BP: (!) 140/90  Pulse: 70  Temp: 97.6 F (36.4 C)  TempSrc: Oral  SpO2: 98%  Weight: 123 lb 9.6 oz (56.1 kg)  Height: 5' 4 (1.626 m)   Body mass index is 21.22 kg/m.     02/18/2024    9:12 AM 08/12/2022   10:15 AM 12/30/2021   10:12 AM 10/31/2020   11:00 AM 10/03/2019   11:24 AM 12/02/2018   10:01 AM 12/02/2018    9:39 AM  Advanced Directives  Does Patient Have a Medical Advance Directive? Yes Yes Yes Yes Yes No;Yes Yes  Type of Estate Agent of [place];Living will Healthcare Power of [place];Living will Healthcare Power of [place];Living will Healthcare Power of [place];Living will Healthcare Power of [place];Living will Healthcare Power of [place];Living will Healthcare Power of [place];Living will  Does patient want to make changes to medical advance directive? No - Patient declined No - Patient declined No - Patient declined No - Patient declined No - Patient declined No - Patient declined    Copy of Healthcare Power of Attorney in Chart? No - copy requested No - copy requested No - copy requested No - copy requested No - copy requested  No - copy requested      Data saved with a previous flowsheet row definition    Current Medications (verified) Outpatient Encounter Medications as of 02/18/2024  Medication Sig   aspirin EC 81 MG tablet Take 81 mg by mouth daily. Swallow whole.   Calcium  Carbonate-Vit D-Min (CALCIUM  1200 PO) Take by mouth.   cyanocobalamin (VITAMIN B12) 250 MCG tablet  Take 250 mcg by mouth daily.   ezetimibe  (ZETIA ) 10 MG tablet Take 1 tablet (10 mg total) by mouth daily.   Multiple Vitamin (MULTIVITAMIN) tablet Take 1 tablet by mouth daily.   Probiotic Product (ALIGN PO) Take by mouth daily.   VITAMIN D  PO Take by mouth. Take 5000 IU QOD and 10,000 IU QOD   Zinc 25 MG TABS Take by mouth.   [DISCONTINUED] gemfibrozil  (LOPID ) 600 MG tablet TAKE 1 TABLET BY MOUTH TWICE DAILY WITH MEALS FOR CHOLESTEROL   gemfibrozil  (LOPID ) 600 MG tablet TAKE 1 TABLET BY MOUTH TWICE DAILY WITH MEALS FOR CHOLESTEROL   No facility-administered encounter medications on file as of 02/18/2024.    Allergies (verified) Atorvastatin  and Fosamax [alendronate sodium]   History: Past Medical History:  Diagnosis Date   History of ITP    Hyperlipidemia    IBS (irritable bowel syndrome)    Osteopenia    Psoriasis    Past Surgical History:  Procedure Laterality Date   ABDOMINAL HYSTERECTOMY     APPENDECTOMY     BREAST SURGERY Right    Biospy, benign   EYE SURGERY Bilateral    Cataracts   EYE SURGERY     Lense implants   TUBAL LIGATION     Family History  Problem Relation Age of Onset   AAA (abdominal aortic aneurysm) Mother    Hypertension Mother    Hypertension Father    Heart disease  Father    Hyperlipidemia Father    Cancer Maternal Aunt        uterine   Breast cancer Neg Hx    Social History   Socioeconomic History   Marital status: Widowed    Spouse name: Not on file   Number of children: 3   Years of education: Not on file   Highest education level: Not on file  Occupational History   Occupation: retired Programmer, Systems  Tobacco Use   Smoking status: Never   Smokeless tobacco: Never  Substance and Sexual Activity   Alcohol use: Yes    Comment: occasional wine 1-2 drinks per month   Drug use: No   Sexual activity: Not Currently  Other Topics Concern   Not on file  Social History Narrative   Not on file   Social Drivers of Health   Financial  Resource Strain: Low Risk  (02/18/2024)   Overall Financial Resource Strain (CARDIA)    Difficulty of Paying Living Expenses: Not hard at all  Food Insecurity: No Food Insecurity (02/18/2024)   Hunger Vital Sign    Worried About Running Out of Food in the Last Year: Never true    Ran Out of Food in the Last Year: Never true  Transportation Needs: No Transportation Needs (02/18/2024)   PRAPARE - Administrator, Civil Service (Medical): No    Lack of Transportation (Non-Medical): No  Physical Activity: Inactive (02/18/2024)   Exercise Vital Sign    Days of Exercise per Week: 0 days    Minutes of Exercise per Session: 0 min  Stress: No Stress Concern Present (02/18/2024)   [employer] of Occupational Health - Occupational Stress Questionnaire    Feeling of Stress: Not at all  Social Connections: Moderately Integrated (02/18/2024)   Social Connection and Isolation Panel    Frequency of Communication with Friends and Family: More than three times a week    Frequency of Social Gatherings with Friends and Family: More than three times a week    Attends Religious Services: 1 to 4 times per year    Active Member of [employer] or Organizations: Yes    Attends Banker Meetings: More than 4 times per year    Marital Status: Widowed    Tobacco Counseling Counseling given: Not Answered Pt is a non smoker so counseling not needed  Clinical Intake:  Pre-visit preparation completed: No  Pain : No/denies pain     BMI - recorded: 21 Nutritional Status: BMI of 19-24  Normal Nutritional Risks: None Diabetes: No  How often do you need to have someone help you when you read instructions, pamphlets, or other written materials from your doctor or pharmacy?: 1 - Never  Interpreter Needed?: No      Activities of Daily Living    02/18/2024    9:10 AM  In your present state of health, do you have any difficulty performing the following activities:  Hearing? 0   Vision? 0  Difficulty concentrating or making decisions? 0  Walking or climbing stairs? 0  Dressing or bathing? 0  Doing errands, shopping? 0  Preparing Food and eating ? N  Using the Toilet? N  In the past six months, have you accidently leaked urine? N  Do you have problems with loss of bowel control? N  Managing your Medications? N  Managing your Finances? N  Housekeeping or managing your Housekeeping? N    Patient Care Team: Ozell Heron HERO, MD as PCP -  General (Family Medicine) Rosan Credit, MD as Consulting Physician (Ophthalmology) Shona Rush, MD (Dermatology)  Indicate any recent Medical Services you may have received from other than [facility] providers in the past year (date may be approximate).     Assessment:   This is a routine wellness examination for [place].  Hearing/Vision screen No results found.   Goals Addressed             This Visit's Progress    Exercise 150 min/wk Moderate Activity   On track    15-20 min daily of exercise to elevate heart rate Weight bearing exercises for lower body regularly for bone strength and balance       Depression Screen    02/18/2024    8:52 AM 11/11/2022   10:47 PM 08/12/2022   10:16 AM 12/30/2021   10:13 AM 05/05/2021   11:41 PM 10/31/2020   11:04 AM 04/18/2020   12:25 AM  PHQ 2/9 Scores  PHQ - 2 Score 0 0 0 0 0 0 0    Fall Risk    02/18/2024    8:52 AM 11/11/2022   10:46 PM 08/12/2022   10:15 AM 12/30/2021   10:13 AM 05/05/2021   11:40 PM  Fall Risk   Falls in the past year? 0 0 0 0 0  Number falls in past yr: 0  0 0   Injury with Fall? 0  0 0   Risk for fall due to : No Fall Risks No Fall Risks No Fall Risks No Fall Risks No Fall Risks  Follow up Falls evaluation completed Falls prevention discussed;Education provided;Falls evaluation completed Falls evaluation completed;Falls prevention discussed Falls evaluation completed;Falls prevention discussed  Falls evaluation completed;Education  provided;Falls prevention discussed      Data saved with a previous flowsheet row definition    MEDICARE RISK AT HOME: Medicare Risk at Home Any stairs in or around the home?: Yes If so, are there any without handrails?: No Home free of loose throw rugs in walkways, pet beds, electrical cords, etc?: No Adequate lighting in your home to reduce risk of falls?: Yes Life alert?: No Use of a cane, walker or w/c?: No Grab bars in the bathroom?: No Shower chair or bench in shower?: Yes Elevated toilet seat or a handicapped toilet?: No  TIMED UP AND GO:  Was the test performed?  Yes  Length of time to ambulate 10 feet: 6 sec Gait steady and fast without use of assistive device    Cognitive Function:        02/18/2024    9:15 AM  6CIT Screen  What Year? 0 points  What month? 0 points  What time? 0 points  Count back from 20 0 points  Months in reverse 0 points  Repeat phrase 0 points  Total Score 0 points    Immunizations Immunization History  Administered Date(s) Administered   Fluad Quad(high Dose 65+) 02/15/2020   Fluad Trivalent(High Dose 65+) 12/29/2022   INFLUENZA, HIGH DOSE SEASONAL PF 01/25/2014, 01/22/2017, 01/03/2021   Influenza Split 01/11/2019   Influenza,inj,quad, With Preservative 01/19/2017   Influenza-Unspecified 12/21/2014, 01/22/2016, 01/18/2018, 01/19/2024   PFIZER Comirnaty(Gray Top)Covid-19 Tri-Sucrose Vaccine 01/29/2022, 01/05/2023   PFIZER(Purple Top)SARS-COV-2 Vaccination 05/11/2019, 06/01/2019, 02/01/2020   PPD Test 03/14/2013   Pfizer Covid-19 Vaccine Bivalent Booster 38yrs & up 12/25/2020, 01/19/2024   Pneumococcal Conjugate-13 12/05/2015   Pneumococcal Polysaccharide-23 02/08/2009, 06/30/2014   Respiratory Syncytial Virus Vaccine,Recomb Aduvanted(Arexvy) 02/28/2022   Td 02/08/2009   Tdap 12/02/2018   Zoster,  Live 03/29/2009    TDAP status: Up to date  Flu Vaccine status: Up to date  Pneumococcal vaccine status: Up to date  Covid-19  vaccine status: Completed vaccines  Qualifies for Shingles Vaccine? Yes   Zostavax completed Yes   Shingrix Completed?: No.    Education has been provided regarding the importance of this vaccine. Patient has been advised to call insurance company to determine out of pocket expense if they have not yet received this vaccine. Advised may also receive vaccine at local pharmacy or Health Dept. Verbalized acceptance and understanding.  Screening Tests Health Maintenance  Topic Date Due   Zoster Vaccines- Shingrix (1 of 2) 08/18/1959   COVID-19 Vaccine (8 - 2025-26 season) 03/15/2024   Medicare Annual Wellness (AWV)  02/17/2025   DTaP/Tdap/Td (3 - Td or Tdap) 12/01/2028   Pneumococcal Vaccine: 50+ Years  Completed   Influenza Vaccine  Completed   DEXA SCAN  Completed   Meningococcal B Vaccine  Aged Out    Health Maintenance  Health Maintenance Due  Topic Date Due   Zoster Vaccines- Shingrix (1 of 2) 08/18/1959    Colorectal cancer screening: No longer required.  Pt had tubular adenoma in 2022, is due in 2027 for repeat colonoscopy.  Mammogram status: Completed 02/10/24. Repeat every year  Bone Density status: Completed 2021. Results reflect: Bone density results: OSTEOPOROSIS. Repeat every 2 years.  Lung Cancer Screening: (Low Dose CT Chest recommended if Age 38-80 years, 20 pack-year currently smoking OR have quit w/in 15years.) does not qualify.   Lung Cancer Screening Referral: N/Ar  Additional Screening:  Hepatitis C Screening: does not qualify;   Vision Screening: Recommended annual ophthalmology exams for early detection of glaucoma and other disorders of the eye. Is the patient up to date with their annual eye exam?  Yes  Who is the provider or what is the name of the office in which the patient attends annual eye exams? [facility] If pt is not established with a provider, would they like to be referred to a provider to establish care? No .   Dental  Screening: Recommended annual dental exams for proper oral hygiene  Community Resource Referral / Chronic Care Management: CRR required this visit?  No   CCM required this visit?  No     Plan:     I have personally reviewed and noted the following in the patient's chart:   Medical and social history Use of alcohol, tobacco or illicit drugs  Current medications and supplements including opioid prescriptions. Patient is not currently taking opioid prescriptions. Functional ability and status Nutritional status Physical activity Advanced directives List of other physicians Hospitalizations, surgeries, and ER visits in previous 12 months Vitals Screenings to include cognitive, depression, and falls Referrals and appointments  In addition, I have reviewed and discussed with patient certain preventive protocols, quality metrics, and best practice recommendations. A written personalized care plan for preventive services as well as general preventive health recommendations were provided to patient.     Heron CHRISTELLA Sharper, MD   02/18/2024   After Visit Summary: (In Person-Printed) AVS printed and given to the patient  Nurse Notes: N/A

## 2024-02-18 NOTE — Patient Instructions (Signed)
 Shingrix vaccine series

## 2024-02-23 ENCOUNTER — Ambulatory Visit: Payer: Self-pay | Admitting: Family Medicine

## 2024-04-20 ENCOUNTER — Other Ambulatory Visit: Payer: Self-pay | Admitting: Family Medicine

## 2024-04-20 DIAGNOSIS — E782 Mixed hyperlipidemia: Secondary | ICD-10-CM

## 2024-08-16 ENCOUNTER — Ambulatory Visit: Admitting: Family Medicine
